# Patient Record
Sex: Male | Born: 1955
Health system: Southern US, Community
[De-identification: ages and names within clinical notes are randomized; demographics above are authoritative.]

## PROBLEM LIST (undated history)

## (undated) DIAGNOSIS — G4730 Sleep apnea, unspecified: Secondary | ICD-10-CM

## (undated) DIAGNOSIS — I10 Essential (primary) hypertension: Secondary | ICD-10-CM

## (undated) DIAGNOSIS — E785 Hyperlipidemia, unspecified: Secondary | ICD-10-CM

## (undated) DIAGNOSIS — Z86010 Personal history of colonic polyps: Secondary | ICD-10-CM

## (undated) DIAGNOSIS — IMO0002 Reserved for concepts with insufficient information to code with codable children: Secondary | ICD-10-CM

## (undated) DIAGNOSIS — T7840XA Allergy, unspecified, initial encounter: Secondary | ICD-10-CM

## (undated) DIAGNOSIS — M1990 Unspecified osteoarthritis, unspecified site: Secondary | ICD-10-CM

## (undated) DIAGNOSIS — E119 Type 2 diabetes mellitus without complications: Secondary | ICD-10-CM

## (undated) DIAGNOSIS — F32A Depression, unspecified: Secondary | ICD-10-CM

## (undated) HISTORY — DX: Allergy, unspecified, initial encounter: T78.40XA

## (undated) HISTORY — PX: VASECTOMY: SHX75

## (undated) HISTORY — DX: Unspecified osteoarthritis, unspecified site: M19.90

## (undated) HISTORY — DX: Personal history of colonic polyps: Z86.010

## (undated) HISTORY — PX: POLYPECTOMY: SHX149

## (undated) HISTORY — DX: Depression, unspecified: F32.A

## (undated) HISTORY — DX: Essential (primary) hypertension: I10

## (undated) HISTORY — DX: Sleep apnea, unspecified: G47.30

## (undated) HISTORY — DX: Type 2 diabetes mellitus without complications: E11.9

## (undated) HISTORY — PX: COLONOSCOPY: SHX174

## (undated) HISTORY — DX: Hyperlipidemia, unspecified: E78.5

## (undated) HISTORY — DX: Reserved for concepts with insufficient information to code with codable children: IMO0002

## (undated) SURGERY — COLONOSCOPY
Anesthesia: Moderate Sedation

---

## 2004-01-20 ENCOUNTER — Encounter: Admission: RE | Admit: 2004-01-20 | Discharge: 2004-01-20 | Payer: Self-pay | Admitting: Unknown Physician Specialty

## 2005-03-20 IMAGING — CR DG SINUSES COMPLETE 3+V
3 series · 3 of 3 positions shown · non-contrast
Comparison: none

CLINICAL DATA: Cough, wheezing.
 SINUS
 Three views of the paranasal sinuses were obtained.   No evidence of sinusitis is seen.  No bony abnormality is noted.
 IMPRESSION
 No sinusitis. 
 CHEST X-RAY
 The heart size and mediastinal contours are unremarkable.  The lungs are clear.  The visualized skeleton is unremarkable.
 No active lung disease.

[view not recorded (1 of 3)]
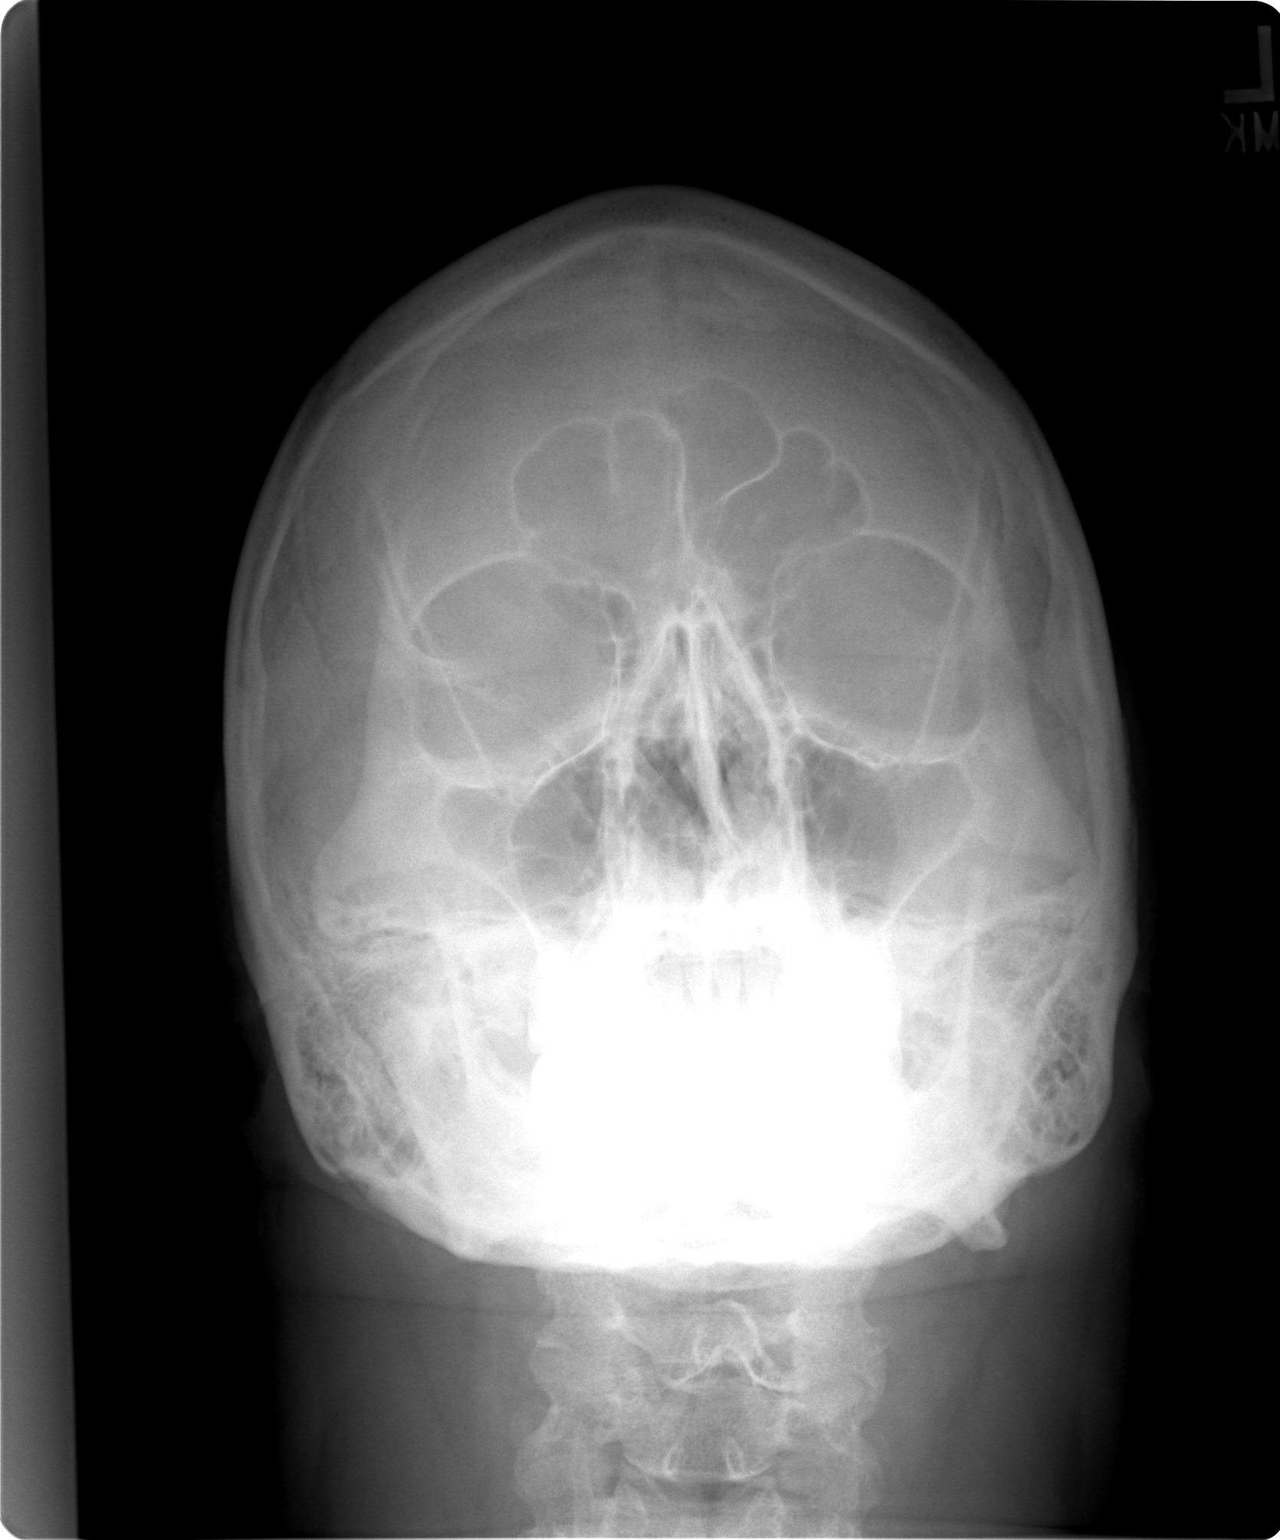

[view not recorded (2 of 3)]
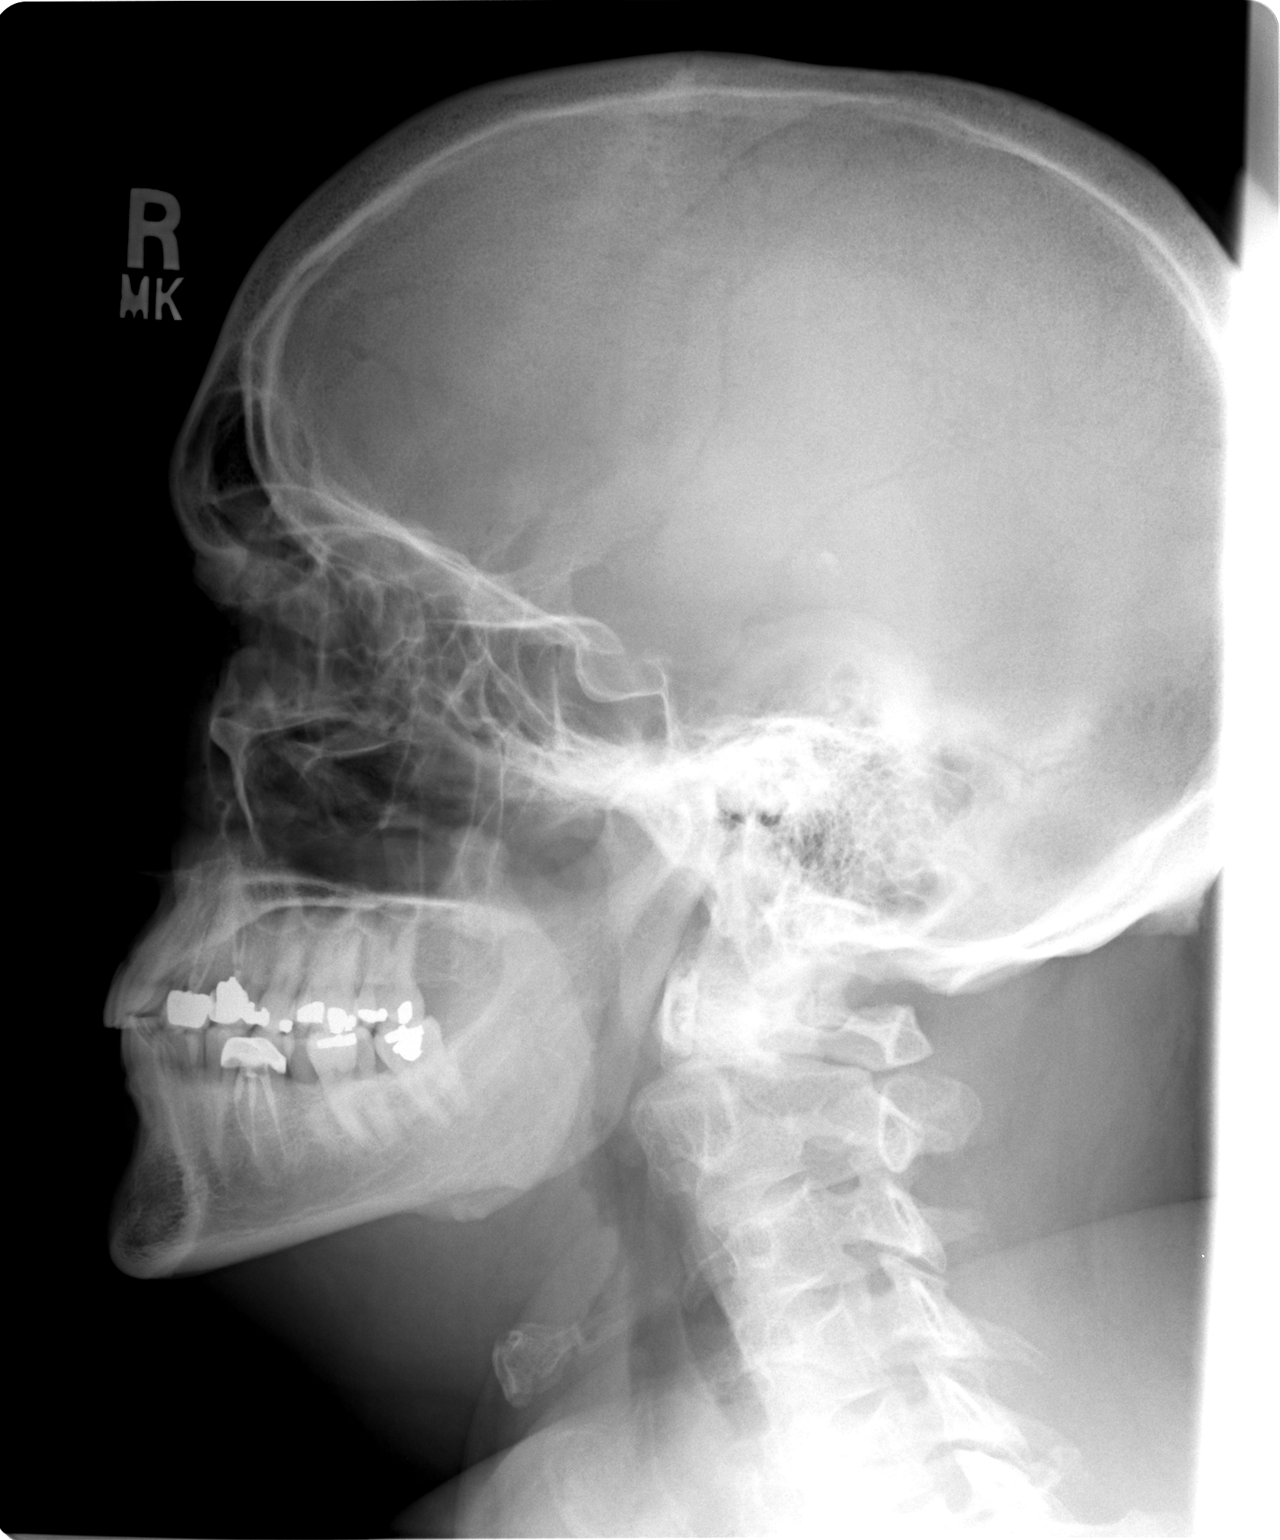

[view not recorded (3 of 3)]
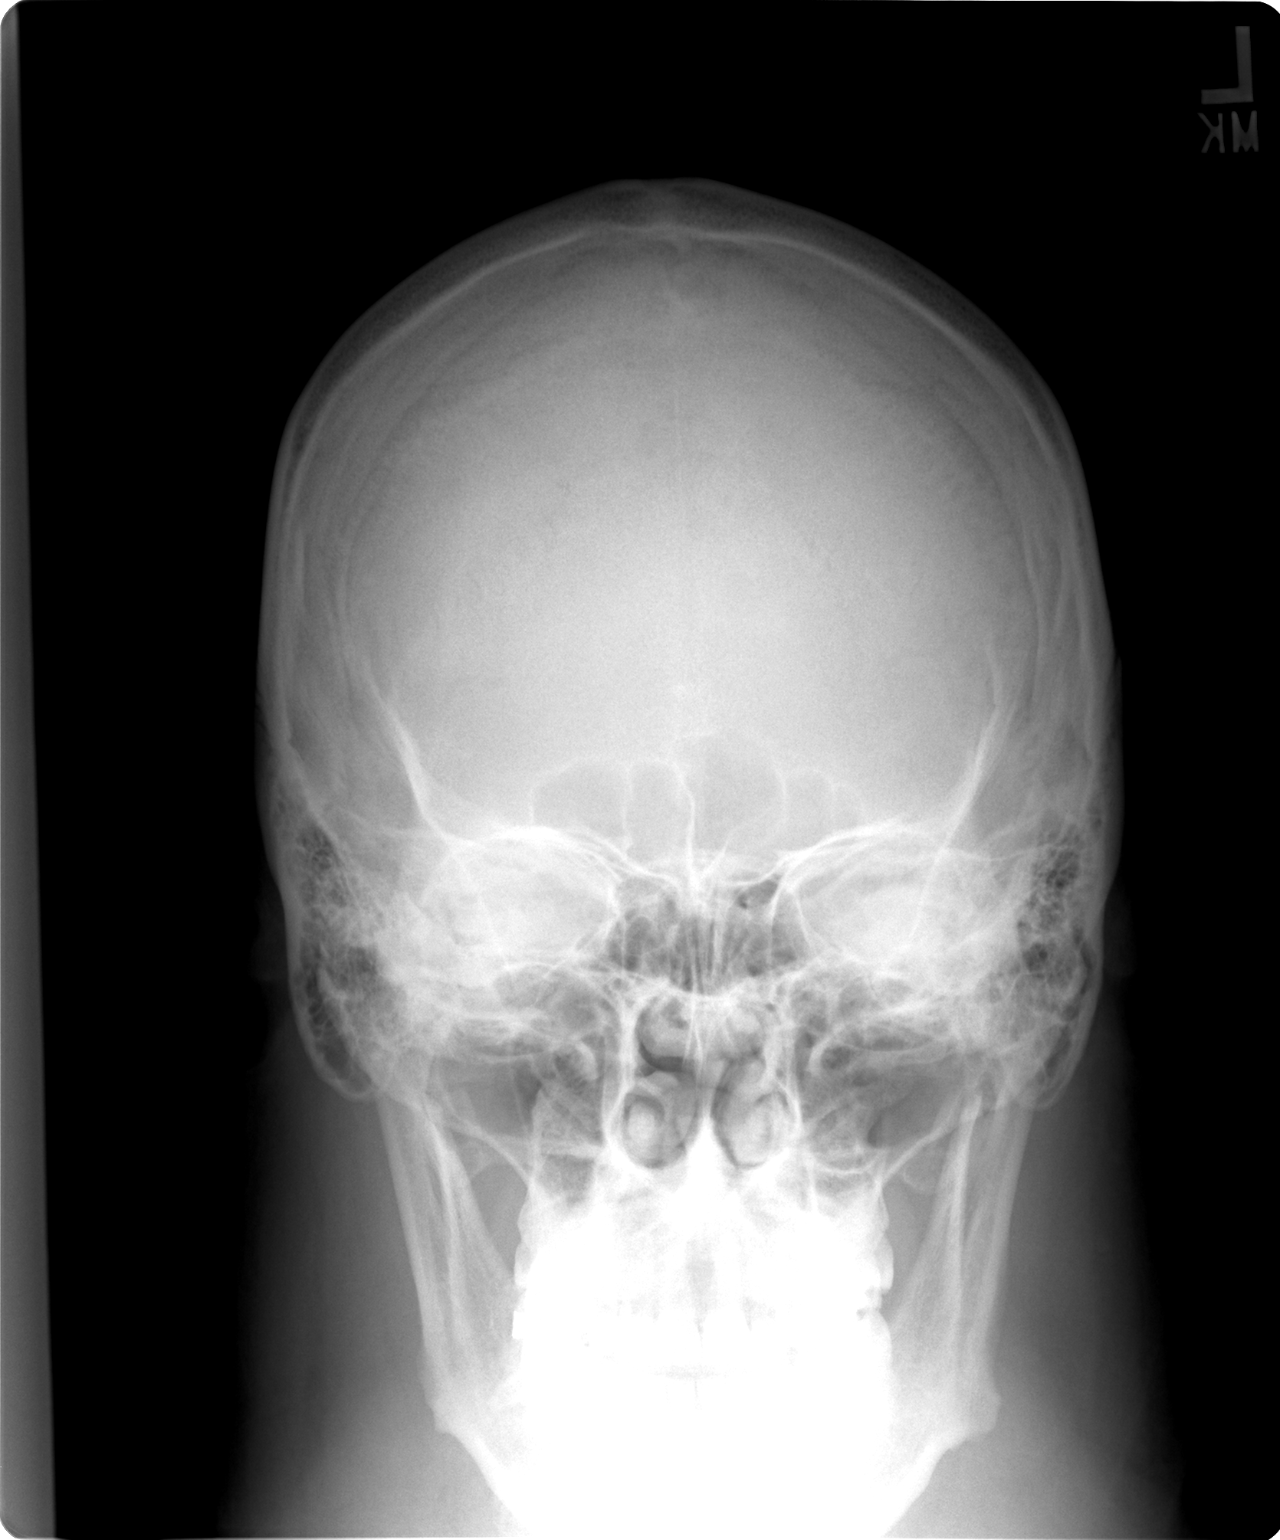

[3 of 3 positions shown; findings below may reference images not displayed]

## 2010-09-27 ENCOUNTER — Encounter (INDEPENDENT_AMBULATORY_CARE_PROVIDER_SITE_OTHER): Payer: Self-pay | Admitting: *Deleted

## 2010-10-07 NOTE — Letter (Signed)
Summary: Pre Visit Letter Revised  [facility]  [address] [place], [place] [address]   Phone: [phone]  Fax: [phone]        09/27/2010 MRN: [id number] [facility] [address] [place], [place]  [address]  [place]             Procedure Date:  11-04-10           Direct Colon---Dr.  Leone Payor   Welcome to the Gastroenterology Division at [facility].    You are scheduled to see a nurse for your pre-procedure visit on 10-20-10 at 3:30p.m. on the 3rd floor at [facility], [address]  We ask that you try to arrive at our office 15 minutes prior to your appointment time to allow for check-in.  Please take a minute to review the attached form.  If you answer "Yes" to one or more of the questions on the first page, we ask that you call the person listed at your earliest opportunity.  If you answer "No" to all of the questions, please complete the rest of the form and bring it to your appointment.    Your nurse visit will consist of discussing your medical and surgical history, your immediate family medical history, and your medications.   If you are unable to list all of your medications on the form, please bring the medication bottles to your appointment and we will list them.  We will need to be aware of both prescribed and over the counter drugs.  We will need to know exact dosage information as well.    Please be prepared to read and sign documents such as consent forms, a financial agreement, and acknowledgement forms.  If necessary, and with your consent, a friend or relative is welcome to sit-in on the nurse visit with you.  Please bring your insurance card so that we may make a copy of it.  If your insurance requires a referral to see a specialist, please bring your referral form from your primary care physician.  No co-pay is required for this nurse visit.     If you cannot keep your appointment, please call [phone] to cancel or reschedule prior  to your appointment date.  This allows [place] the opportunity to schedule an appointment for another patient in need of care.    Thank you for choosing [facility] for your medical needs.  We appreciate the opportunity to care for you.  Please visit [place] at our website  to learn more about our practice.  Sincerely, The Gastroenterology Division

## 2010-10-19 ENCOUNTER — Encounter (INDEPENDENT_AMBULATORY_CARE_PROVIDER_SITE_OTHER): Payer: Self-pay | Admitting: *Deleted

## 2010-10-28 NOTE — Letter (Signed)
Summary: Pre Visit Letter Revised  [facility]  [address] [place], [place] [address]   Phone: [phone]  Fax: [phone]        10/19/2010 MRN: [id number] [facility] [address] [place], [place]  [address]  [place]             Procedure Date:  12-03-10           Direct Colon---Dr. Leone Payor   Welcome to the Gastroenterology Division at [facility].    You are scheduled to see a nurse for your pre-procedure visit on 11-22-10 at 3:30p.m. on the 3rd floor at [facility], [address].  We ask that you try to arrive at our office 15 minutes prior to your appointment time to allow for check-in.  Please take a minute to review the attached form.  If you answer "Yes" to one or more of the questions on the first page, we ask that you call the person listed at your earliest opportunity.  If you answer "No" to all of the questions, please complete the rest of the form and bring it to your appointment.    Your nurse visit will consist of discussing your medical and surgical history, your immediate family medical history, and your medications.   If you are unable to list all of your medications on the form, please bring the medication bottles to your appointment and we will list them.  We will need to be aware of both prescribed and over the counter drugs.  We will need to know exact dosage information as well.    Please be prepared to read and sign documents such as consent forms, a financial agreement, and acknowledgement forms.  If necessary, and with your consent, a friend or relative is welcome to sit-in on the nurse visit with you.  Please bring your insurance card so that we may make a copy of it.  If your insurance requires a referral to see a specialist, please bring your referral form from your primary care physician.  No co-pay is required for this nurse visit.     If you cannot keep your appointment, please call [phone] to cancel or reschedule prior  to your appointment date.  This allows [place] the opportunity to schedule an appointment for another patient in need of care.    Thank you for choosing [facility] for your medical needs.  We appreciate the opportunity to care for you.  Please visit [place] at our website  to learn more about our practice.  Sincerely, The Gastroenterology Division

## 2010-11-04 ENCOUNTER — Other Ambulatory Visit: Payer: Self-pay | Admitting: Internal Medicine

## 2010-11-22 ENCOUNTER — Ambulatory Visit (AMBULATORY_SURGERY_CENTER): Payer: BC Managed Care – PPO | Admitting: *Deleted

## 2010-11-22 VITALS — Ht 72.0 in | Wt 301.5 lb

## 2010-11-22 DIAGNOSIS — Z1211 Encounter for screening for malignant neoplasm of colon: Secondary | ICD-10-CM

## 2010-11-22 MED ORDER — PEG-KCL-NACL-NASULF-NA ASC-C 100 G PO SOLR: ORAL | Status: DC

## 2010-11-23 ENCOUNTER — Encounter: Payer: Self-pay | Admitting: Internal Medicine

## 2010-12-03 ENCOUNTER — Other Ambulatory Visit: Payer: Self-pay | Admitting: Internal Medicine

## 2011-01-13 ENCOUNTER — Encounter: Payer: Self-pay | Admitting: Internal Medicine

## 2011-01-13 ENCOUNTER — Ambulatory Visit (AMBULATORY_SURGERY_CENTER): Payer: BC Managed Care – PPO | Admitting: Internal Medicine

## 2011-01-13 VITALS — BP 109/58 | HR 60 | Temp 97.5°F | Resp 16 | Ht 72.0 in | Wt 301.0 lb

## 2011-01-13 DIAGNOSIS — D128 Benign neoplasm of rectum: Secondary | ICD-10-CM

## 2011-01-13 DIAGNOSIS — D129 Benign neoplasm of anus and anal canal: Secondary | ICD-10-CM

## 2011-01-13 DIAGNOSIS — D1330 Benign neoplasm of unspecified part of small intestine: Secondary | ICD-10-CM

## 2011-01-13 DIAGNOSIS — Z800 Family history of malignant neoplasm of digestive organs: Secondary | ICD-10-CM

## 2011-01-13 DIAGNOSIS — Z1211 Encounter for screening for malignant neoplasm of colon: Secondary | ICD-10-CM

## 2011-01-13 DIAGNOSIS — K620 Anal polyp: Secondary | ICD-10-CM

## 2011-01-13 DIAGNOSIS — K621 Rectal polyp: Secondary | ICD-10-CM

## 2011-01-13 DIAGNOSIS — D126 Benign neoplasm of colon, unspecified: Secondary | ICD-10-CM

## 2011-01-13 HISTORY — PX: COLONOSCOPY W/ POLYPECTOMY: SHX1380

## 2011-01-13 MED ORDER — SODIUM CHLORIDE 0.9 % IV SOLN: 500.0000 mL | INTRAVENOUS | Status: DC

## 2011-01-13 MED ORDER — ASPIRIN 81 MG PO TABS: 81.0000 mg | ORAL_TABLET | Freq: Every day | ORAL | Status: DC

## 2011-01-13 NOTE — Patient Instructions (Signed)
Please follow discharge instructions on the green and blue handouts given to your care partner.  See colonoscopy findings on the pentax picture page also.  One of the three polyps removed today in the cecum was not completely removed.  We will be back in touch with the pathology results within 1-2 weeks.  Await pathology results to advise further colonoscopy recommendations.  Please hold aspirin, aspirin products and anti-inflammatory medications for 2 weeks until 01/27/2011.  You may resume your other medications today.

## 2011-01-13 NOTE — Progress Notes (Signed)
No complaints on discharge.  MAW 

## 2011-01-14 ENCOUNTER — Telehealth: Payer: Self-pay | Admitting: *Deleted

## 2011-01-14 NOTE — Telephone Encounter (Signed)
No message left due to no ID on VM.

## 2011-01-19 ENCOUNTER — Telehealth: Payer: Self-pay | Admitting: *Deleted

## 2011-01-19 ENCOUNTER — Encounter: Payer: Self-pay | Admitting: Internal Medicine

## 2011-01-19 DIAGNOSIS — Z860100 Personal history of colon polyps, unspecified: Secondary | ICD-10-CM

## 2011-01-19 HISTORY — DX: Personal history of colon polyps, unspecified: Z86.0100

## 2011-01-19 HISTORY — DX: Personal history of colonic polyps: Z86.010

## 2011-01-19 NOTE — Telephone Encounter (Signed)
Notes Recorded by Iva Boop, MD on 01/19/2011 at 7:33 AM Please call patient when you receive this and let him know polyps benign and that weill repeat colonoscopy by dec 2012 (letter to go out) ------  Notes Recorded by Iva Boop, MD on 01/19/2011 at 7:30 AM Multiple adenomas, one large and needs closer follow-up re: ? Removed Repeat colonoscopy 3-6 months (by Dec 202) at hospital to have PC available  Tried to reach pt at all three phone numbers- no i.d. On home or cell phone, not able to reach at his work number

## 2011-01-19 NOTE — Telephone Encounter (Signed)
Message copied by Inocente Salles on Wed Jan 19, 2011  3:47 PM ------      Message from: Stan Head E      Created: Wed Jan 19, 2011  7:33 AM       Please call patient when you receive this and let him know polyps benign and that weill repeat colonoscopy by dec 2012 (letter to go out)

## 2011-01-19 NOTE — Progress Notes (Signed)
Quick Note:  Multiple adenomas, one large and needs closer follow-up re: ? Removed Repeat colonoscopy 3-6 months (by Dec 202) at hospital to have PC available ______

## 2011-01-19 NOTE — Progress Notes (Signed)
Quick Note:  Please call patient when you receive this and let him know polyps benign and that weill repeat colonoscopy by dec 2012 (letter to go out) ______

## 2011-01-21 ENCOUNTER — Encounter: Payer: Self-pay | Admitting: *Deleted

## 2011-01-21 ENCOUNTER — Telehealth: Payer: Self-pay | Admitting: *Deleted

## 2011-01-21 NOTE — Telephone Encounter (Signed)
Patient notified of pathology result per Dr Marvell Fuller note and Dr Marvell Fuller desire to repeat his colonoscopy in 3-6 months at the hospital per his note. Also told patient he should be receiving a letter from [place] as well and that if he has any problems or concerns to please call Dr Teresita Madura office. I notified patient we put in for the repeat procedure for September 2012 but we have until December per the note but he will be contacted by September to have procedure done. Patient returned verbal understanding of all information given.  EWM,RN

## 2011-06-01 ENCOUNTER — Telehealth: Payer: Self-pay

## 2011-06-01 NOTE — Telephone Encounter (Signed)
Left message for patient to call back to discuss colon

## 2011-06-07 NOTE — Telephone Encounter (Signed)
Scheduled for 07/08/11 12:30 [facility] scheduled with Noreene Larsson booking # [phone].  Patient will come for a pre-visit 07/01/11 4:00

## 2011-06-29 ENCOUNTER — Telehealth: Payer: Self-pay | Admitting: Internal Medicine

## 2011-06-29 NOTE — Telephone Encounter (Signed)
Patient is not able to make the appt at [facility] for 07/08/11.  He wants to reschedule repeat colon for January I will call him once the schedule comes out.

## 2011-07-08 ENCOUNTER — Ambulatory Visit (HOSPITAL_COMMUNITY): Admit: 2011-07-08 | Payer: Self-pay | Admitting: Internal Medicine

## 2011-07-08 ENCOUNTER — Encounter (HOSPITAL_COMMUNITY): Payer: Self-pay

## 2011-07-08 SURGERY — COLONOSCOPY
Anesthesia: Moderate Sedation

## 2011-10-07 ENCOUNTER — Telehealth: Payer: Self-pay | Admitting: Internal Medicine

## 2011-10-07 NOTE — Telephone Encounter (Signed)
Patient needs to reschedule a colon at the hospital for Dr Marvell Fuller next hospital week.  I have explained that that schedule is not available and have asked him to call back the end of march

## 2011-11-15 ENCOUNTER — Telehealth: Payer: Self-pay | Admitting: Internal Medicine

## 2011-11-15 NOTE — Telephone Encounter (Signed)
Patient is rescheduled from December for 12/14/11 8:30, he will come for a pre-visit on 12/06/11 3:30.  The patient is aware of the dates and times

## 2011-11-16 ENCOUNTER — Other Ambulatory Visit: Payer: Self-pay

## 2011-12-06 ENCOUNTER — Telehealth: Payer: Self-pay | Admitting: *Deleted

## 2011-12-06 NOTE — Telephone Encounter (Signed)
Pt no show for previsit on 12/06/11.  I spoke with his wife Marylu Lund and [facility] his previsit for Monday 12/12/11 and instructed her to have [place] eliminate Nuts, seed, popcorn, corn, beans, peas, salad and raw vegetables from his diet starting Friday 12/09/11.  I was unable to schedule him this week due to full schedule.  Wyona Almas

## 2011-12-12 ENCOUNTER — Ambulatory Visit (AMBULATORY_SURGERY_CENTER): Payer: BC Managed Care – PPO | Admitting: *Deleted

## 2011-12-12 VITALS — Ht 72.0 in | Wt 290.0 lb

## 2011-12-12 DIAGNOSIS — D126 Benign neoplasm of colon, unspecified: Secondary | ICD-10-CM

## 2011-12-12 MED ORDER — PEG-KCL-NACL-NASULF-NA ASC-C 100 G PO SOLR: ORAL | Status: DC

## 2011-12-14 ENCOUNTER — Encounter (HOSPITAL_COMMUNITY)
Admission: RE | Disposition: A | Discharge status: Home / Self Care | Payer: Self-pay | Source: Ambulatory Visit | Attending: Internal Medicine

## 2011-12-14 ENCOUNTER — Ambulatory Visit (HOSPITAL_COMMUNITY)
Admission: RE | Admit: 2011-12-14 | Discharge: 2011-12-14 | Disposition: A | Discharge status: Home / Self Care | Payer: BC Managed Care – PPO | Source: Ambulatory Visit | Attending: Internal Medicine | Admitting: Internal Medicine

## 2011-12-14 ENCOUNTER — Encounter (HOSPITAL_COMMUNITY): Payer: Self-pay

## 2011-12-14 DIAGNOSIS — I10 Essential (primary) hypertension: Secondary | ICD-10-CM | POA: Insufficient documentation

## 2011-12-14 DIAGNOSIS — G4730 Sleep apnea, unspecified: Secondary | ICD-10-CM | POA: Insufficient documentation

## 2011-12-14 DIAGNOSIS — E785 Hyperlipidemia, unspecified: Secondary | ICD-10-CM | POA: Insufficient documentation

## 2011-12-14 DIAGNOSIS — D126 Benign neoplasm of colon, unspecified: Secondary | ICD-10-CM | POA: Insufficient documentation

## 2011-12-14 HISTORY — PX: COLONOSCOPY: SHX5424

## 2011-12-14 HISTORY — PX: HOT HEMOSTASIS: SHX5433

## 2011-12-14 SURGERY — COLONOSCOPY
Anesthesia: Moderate Sedation

## 2011-12-14 MED ORDER — FENTANYL CITRATE 0.05 MG/ML IJ SOLN
INTRAMUSCULAR | Status: AC
  Filled 2011-12-14: qty 4

## 2011-12-14 MED ORDER — MIDAZOLAM HCL 10 MG/2ML IJ SOLN
INTRAMUSCULAR | Status: AC
  Filled 2011-12-14: qty 4

## 2011-12-14 MED ORDER — MIDAZOLAM HCL 5 MG/5ML IJ SOLN
INTRAMUSCULAR | Status: DC | PRN
  Administered 2011-12-14 (×3): 2.5 mg via INTRAVENOUS

## 2011-12-14 MED ORDER — FENTANYL NICU IV SYRINGE 50 MCG/ML
INJECTION | INTRAMUSCULAR | Status: DC | PRN
  Administered 2011-12-14 (×3): 25 ug via INTRAVENOUS

## 2011-12-14 MED ORDER — ASPIRIN 81 MG PO TABS: 81.0000 mg | ORAL_TABLET | Freq: Every day | ORAL | Status: AC

## 2011-12-14 MED ORDER — DIPHENHYDRAMINE HCL 50 MG/ML IJ SOLN
INTRAMUSCULAR | Status: AC
  Filled 2011-12-14: qty 1

## 2011-12-14 NOTE — Op Note (Signed)
[facility] [address] [place], [place]  [address]  COLONOSCOPY PROCEDURE REPORT  PATIENT:  Mark White, Mark White  MR#:  [id number] BIRTHDATE:  07-11-1956, 55 yrs. old  GENDER:  male ENDOSCOPIST:  Iva Boop, MD, [facility]  PROCEDURE DATE:  12/14/2011 PROCEDURE:  Colonoscopy with snare polypectomy, Colonoscopy with tumor ablation ASA CLASS:  Class II INDICATIONS:  close follow-up of large cecal polyp - snared 12/2010. Also had other adenomas. MEDICATIONS:   Fentanyl 75 mg IV, Versed 7.5 mg IV  DESCRIPTION OF PROCEDURE:   After the risks benefits and alternatives of the procedure were thoroughly explained, informed consent was obtained.  Digital rectal exam was performed and revealed no abnormalities.   The Pentax Colonoscope [id number] endoscope was introduced through the anus and advanced to the cecum, which was identified by both the appendix and ileocecal valve, without limitations.  The quality of the prep was good, using MoviPrep.  The instrument was then slowly withdrawn as the colon was fully examined. <<PROCEDUREIMAGES>>  FINDINGS:  A sessile polyp was found in the cecum. Residual polyp 8-10 mm, from 3 cm polyp snared 12/2011. Snared piecemeal and APC cautery ablation performed.  Six other polyps were found. 1) ? IC valve polyp 8-10 mm snare cautery removal 2) 4 mm cecal polyp cold snared 3) 12 mm flat cecal poly (?) snare cautery removal 4) 1 cm ascending colon polyp snare cautery removal 5) two diminutive sigmoid polyps cold snared  This was otherwise a normal examination of the colon. Difficult to obtain and maintain position in cecum - umbilical pressure allowed this.   Retroflexed views in the rectum revealed no abnormalities.    The time to cecum = 4:00 minutes. The scope was then withdrawn and polyps snared in 49:00 minutes from the cecum and the procedure completed. COMPLICATIONS:  None ENDOSCOPIC IMPRESSION: 1) Sessile polyp in the cecum - residual from  12/2010 polypectomy  2) Six other  polyps/possible polyps removed 3) Otherwise normal examination RECOMMENDATIONS: 1) Hold aspirin, aspirin products, and anti-inflammatory medication for 2 weeks. REPEAT EXAM:  In for Colonoscopy, pending biopsy results.  Iva Boop, MD, [facility]  CC:  [facility]  n. eSIGNED:   Iva Boop at 12/14/2011 09:46 AM  Dwana Melena, [phone]

## 2011-12-14 NOTE — H&P (Signed)
  Cc:  Hx of colon polyps  HPI  Patient is here for follow-up of adenomatous colon polyps. No GI complaints today.  No Known Allergies @[facility] @ Past Medical History  Diagnosis Date  . Sleep apnea     not using CPAP  . Hypertension   . Hyperlipidemia   . Personal history of adenomatous colonic polyps 01/19/2011  . Ulcer     30 years ago   Past Surgical History  Procedure Date  . Colonoscopy w/ polypectomy 01/13/11    3 polyps, largest 3 cm cecal polyp - adenomas   History   Social History  . Marital Status: Married    Spouse Name: N/A    Number of Children: N/A  . Years of Education: N/A   Social History Main Topics  . Smoking status: Never Smoker   . Smokeless tobacco: Never Used  . Alcohol Use: None     rarely 1-2 a month/beer  . Drug Use: No  . Sexually Active: None   Other Topics Concern  . None   Social History Narrative  . None   Family History  Problem Relation Age of Onset  . Colon cancer Mother    PE:  See pre-sedation exam  A/P  History of colon polyps, one 3 cm - needs closer follow-up, for colonoscopy today  The risks and benefits as well as alternatives of endoscopic procedure(s) have been discussed and reviewed. All questions answered. The patient agrees to proceed.

## 2011-12-14 NOTE — Discharge Instructions (Signed)
Colonoscopy Care After Read the instructions outlined below and refer to this sheet in the next few weeks. These discharge instructions provide you with general information on caring for yourself after you leave the hospital. Your doctor may also give you specific instructions. While your treatment has been planned according to the most current medical practices available, unavoidable complications occasionally occur. If you have any problems or questions after discharge, call your doctor. HOME CARE INSTRUCTIONS ACTIVITY:  You may resume your regular activity, but move at a slower pace for the next 24 hours.   Take frequent rest periods for the next 24 hours.   Walking will help get rid of the air and reduce the bloated feeling in your belly (abdomen).   No driving for 24 hours (because of the medicine (anesthesia) used during the test).   You may shower.   Do not sign any important legal documents or operate any machinery for 24 hours (because of the anesthesia used during the test).  NUTRITION:  Drink plenty of fluids.   You may resume your normal diet as instructed by your doctor.   Begin with a light meal and progress to your normal diet. Heavy or fried foods are harder to digest and may make you feel sick to your stomach (nauseated).   Avoid alcoholic beverages for 24 hours or as instructed.  MEDICATIONS:  You may resume your normal medications unless your doctor tells you otherwise.  WHAT TO EXPECT TODAY:  Some feelings of bloating in the abdomen.   Passage of more gas than usual.   Spotting of blood in your stool or on the toilet paper.  IF YOU HAD POLYPS REMOVED DURING THE COLONOSCOPY:  No aspirin products for 7 days or as instructed.   No alcohol for 7 days or as instructed.   Eat a soft diet for the next 24 hours.  FINDING OUT THE RESULTS OF YOUR TEST Not all test results are available during your visit. If your test results are not back during the visit, make an  appointment with your caregiver to find out the results. Do not assume everything is normal if you have not heard from your caregiver or the medical facility. It is important for you to follow up on all of your test results.  SEEK IMMEDIATE MEDICAL CARE IF:  You have more than a spotting of blood in your stool.   Your belly is swollen (abdominal distention).   You are nauseated or vomiting.   You have a fever.   You have abdominal pain or discomfort that is severe or gets worse throughout the day.    Moderate Sedation, Adult Moderate sedation is given to help you relax or even sleep through a procedure. You may remain sleepy, be clumsy, or have poor balance for several hours following this procedure. Arrange for a responsible adult, family member, or friend to take you home. A responsible adult should stay with you for at least 24 hours or until the medicines have worn off.  Do not participate in any activities where you could become injured for the next 24 hours, or until you feel normal again. Do not:   Drive.   Swim.   Ride a bicycle.   Operate heavy machinery.   Cook.   Use power tools.   Climb ladders.   Work at [employer].   Do not make important decisions or sign legal documents until you are improved.   Vomiting may occur if you eat too soon. When  you can drink without vomiting, try water, juice, or soup. Try solid foods if you feel little or no nausea.   Only take over-the-counter or prescription medications for pain, discomfort, or fever as directed by your caregiver.If pain medications have been prescribed for you, ask your caregiver how soon it is safe to take them.   Make sure you and your family fully understands everything about the medication given to you. Make sure you understand what side effects may occur.   You should not drink alcohol, take sleeping pills, or medications that cause drowsiness for at least 24 hours.   If you smoke, do not smoke alone.    If you are feeling better, you may resume normal activities 24 hours after receiving sedation.   Keep all appointments as scheduled. Follow all instructions.   Ask questions if you do not understand.  SEEK MEDICAL CARE IF:   Your skin is pale or bluish in color.   You continue to feel sick to your stomach (nauseous) or throw up (vomit).   Your pain is getting worse and not helped by medication.   You have bleeding or swelling.   You are still sleepy or feeling clumsy after 24 hours.  SEEK IMMEDIATE MEDICAL CARE IF:   You develop a rash.   You have difficulty breathing.   You develop any type of allergic problem.   You have a fever.  Document Released: 04/12/2001 Document Revised: 07/07/2011 Document Reviewed: 09/03/2007 [facility] Patient Information 2012 [place], [place].

## 2011-12-16 ENCOUNTER — Encounter (HOSPITAL_COMMUNITY): Payer: Self-pay | Admitting: Internal Medicine

## 2011-12-18 ENCOUNTER — Encounter: Payer: Self-pay | Admitting: Internal Medicine

## 2011-12-18 NOTE — Progress Notes (Signed)
Quick Note:  residual cecal adenoma 2 other adenomas Colonoscopy recall 4 months (to do by 6 months) - with APC available  Recall for September 2013 ______

## 2012-06-08 ENCOUNTER — Encounter: Payer: Self-pay | Admitting: Internal Medicine

## 2012-09-06 ENCOUNTER — Telehealth: Payer: Self-pay | Admitting: Internal Medicine

## 2012-09-07 NOTE — Telephone Encounter (Signed)
Patient is scheduled for 10/10/12 0830 colon with APC.  Scheduled with Noreene Larsson booking number [phone].  Patient aware of appt date and time, and to come for a pre-visit 09/28/12 2:30

## 2012-09-28 ENCOUNTER — Ambulatory Visit (AMBULATORY_SURGERY_CENTER): Payer: BC Managed Care – PPO | Admitting: *Deleted

## 2012-09-28 ENCOUNTER — Encounter: Payer: Self-pay | Admitting: Internal Medicine

## 2012-09-28 VITALS — Ht 72.0 in | Wt 309.4 lb

## 2012-09-28 DIAGNOSIS — Z1211 Encounter for screening for malignant neoplasm of colon: Secondary | ICD-10-CM

## 2012-09-28 MED ORDER — NA SULFATE-K SULFATE-MG SULF 17.5-3.13-1.6 GM/177ML PO SOLN: ORAL | Status: DC

## 2012-10-10 ENCOUNTER — Ambulatory Visit (HOSPITAL_COMMUNITY)
Admission: RE | Admit: 2012-10-10 | Discharge: 2012-10-10 | Disposition: A | Discharge status: Home / Self Care | Payer: BC Managed Care – PPO | Source: Ambulatory Visit | Attending: Internal Medicine | Admitting: Internal Medicine

## 2012-10-10 ENCOUNTER — Encounter (HOSPITAL_COMMUNITY)
Admission: RE | Disposition: A | Discharge status: Home / Self Care | Payer: Self-pay | Source: Ambulatory Visit | Attending: Internal Medicine

## 2012-10-10 ENCOUNTER — Encounter: Payer: Self-pay | Admitting: Internal Medicine

## 2012-10-10 ENCOUNTER — Encounter (HOSPITAL_COMMUNITY): Payer: Self-pay | Admitting: *Deleted

## 2012-10-10 DIAGNOSIS — Z79899 Other long term (current) drug therapy: Secondary | ICD-10-CM | POA: Insufficient documentation

## 2012-10-10 DIAGNOSIS — D126 Benign neoplasm of colon, unspecified: Secondary | ICD-10-CM | POA: Insufficient documentation

## 2012-10-10 DIAGNOSIS — I10 Essential (primary) hypertension: Secondary | ICD-10-CM | POA: Insufficient documentation

## 2012-10-10 DIAGNOSIS — Z800 Family history of malignant neoplasm of digestive organs: Secondary | ICD-10-CM | POA: Insufficient documentation

## 2012-10-10 DIAGNOSIS — G4730 Sleep apnea, unspecified: Secondary | ICD-10-CM | POA: Insufficient documentation

## 2012-10-10 DIAGNOSIS — E785 Hyperlipidemia, unspecified: Secondary | ICD-10-CM | POA: Insufficient documentation

## 2012-10-10 DIAGNOSIS — Z7982 Long term (current) use of aspirin: Secondary | ICD-10-CM | POA: Insufficient documentation

## 2012-10-10 DIAGNOSIS — Z86010 Personal history of colonic polyps: Secondary | ICD-10-CM

## 2012-10-10 HISTORY — PX: COLONOSCOPY: SHX5424

## 2012-10-10 SURGERY — COLONOSCOPY
Anesthesia: Moderate Sedation

## 2012-10-10 MED ORDER — MIDAZOLAM HCL 10 MG/2ML IJ SOLN
INTRAMUSCULAR | Status: AC
  Filled 2012-10-10: qty 2

## 2012-10-10 MED ORDER — MIDAZOLAM HCL 5 MG/5ML IJ SOLN
INTRAMUSCULAR | Status: DC | PRN
  Administered 2012-10-10 (×3): 2 mg via INTRAVENOUS

## 2012-10-10 MED ORDER — FENTANYL CITRATE 0.05 MG/ML IJ SOLN
INTRAMUSCULAR | Status: AC
  Filled 2012-10-10: qty 2

## 2012-10-10 MED ORDER — FENTANYL CITRATE 0.05 MG/ML IJ SOLN
INTRAMUSCULAR | Status: DC | PRN
  Administered 2012-10-10 (×3): 25 ug via INTRAVENOUS

## 2012-10-10 MED ORDER — DIPHENHYDRAMINE HCL 50 MG/ML IJ SOLN
INTRAMUSCULAR | Status: AC
  Filled 2012-10-10: qty 1

## 2012-10-10 MED ORDER — SODIUM CHLORIDE 0.9 % IV SOLN
INTRAVENOUS | Status: DC
  Administered 2012-10-10: 08:00:00 via INTRAVENOUS

## 2012-10-10 MED ORDER — SODIUM CHLORIDE 0.9 % IV SOLN: INTRAVENOUS | Status: DC

## 2012-10-10 NOTE — Discharge Instructions (Signed)
The large polyp in the cecum is gone. I found one new tiny polyp - removed but did not capture - it is not a problem.  I recommend you have a routine colonoscopy in 3 years - 2017.  Thank you for choosing me and [facility].  Iva Boop, MD, [facility]  Colonoscopy Care After Read the instructions outlined below and refer to this sheet in the next few weeks. These discharge instructions provide you with general information on caring for yourself after you leave the hospital. Your doctor may also give you specific instructions. While your treatment has been planned according to the most current medical practices available, unavoidable complications occasionally occur. If you have any problems or questions after discharge, call your doctor. HOME CARE INSTRUCTIONS ACTIVITY:  You may resume your regular activity, but move at a slower pace for the next 24 hours.  Take frequent rest periods for the next 24 hours.  Walking will help get rid of the air and reduce the bloated feeling in your belly (abdomen).  No driving for 24 hours (because of the medicine (anesthesia) used during the test).  You may shower.  Do not sign any important legal documents or operate any machinery for 24 hours (because of the anesthesia used during the test). NUTRITION:  Drink plenty of fluids.  You may resume your normal diet as instructed by your doctor.  Begin with a light meal and progress to your normal diet. Heavy or fried foods are harder to digest and may make you feel sick to your stomach (nauseated).  Avoid alcoholic beverages for 24 hours or as instructed. MEDICATIONS:  You may resume your normal medications unless your doctor tells you otherwise. WHAT TO EXPECT TODAY:  Some feelings of bloating in the abdomen.  Passage of more gas than usual.  Spotting of blood in your stool or on the toilet paper. IF YOU HAD POLYPS REMOVED DURING THE COLONOSCOPY:  No aspirin products for 7  days or as instructed.  No alcohol for 7 days or as instructed.  Eat a soft diet for the next 24 hours. FINDING OUT THE RESULTS OF YOUR TEST Not all test results are available during your visit. If your test results are not back during the visit, make an appointment with your caregiver to find out the results. Do not assume everything is normal if you have not heard from your caregiver or the medical facility. It is important for you to follow up on all of your test results.  SEEK IMMEDIATE MEDICAL CARE IF:  You have more than a spotting of blood in your stool.  Your belly is swollen (abdominal distention).  You are nauseated or vomiting.  You have a fever.  You have abdominal pain or discomfort that is severe or gets worse throughout the day. Document Released: 03/01/2004 Document Revised: 10/10/2011 Document Reviewed: 02/28/2008 [facility] Patient Information 2013 [place], [place].  Moderate Sedation, Adult Moderate sedation is given to help you relax or even sleep through a procedure. You may remain sleepy, be clumsy, or have poor balance for several hours following this procedure. Arrange for a responsible adult, family member, or friend to take you home. A responsible adult should stay with you for at least 24 hours or until the medicines have worn off.  Do not participate in any activities where you could become injured for the next 24 hours, or until you feel normal again. Do not:  Drive.  Swim.  Ride a bicycle.  Operate heavy machinery.  Cook.  Use power tools.  Climb ladders.  Work at [employer].  Do not make important decisions or sign legal documents until you are improved.  Vomiting may occur if you eat too soon. When you can drink without vomiting, try water, juice, or soup. Try solid foods if you feel little or no nausea.  Only take over-the-counter or prescription medications for pain, discomfort, or fever as directed by your caregiver.If pain medications have  been prescribed for you, ask your caregiver how soon it is safe to take them.  Make sure you and your family fully understands everything about the medication given to you. Make sure you understand what side effects may occur.  You should not drink alcohol, take sleeping pills, or medications that cause drowsiness for at least 24 hours.  If you smoke, do not smoke alone.  If you are feeling better, you may resume normal activities 24 hours after receiving sedation.  Keep all appointments as scheduled. Follow all instructions.  Ask questions if you do not understand. SEEK MEDICAL CARE IF:   Your skin is pale or bluish in color.  You continue to feel sick to your stomach (nauseous) or throw up (vomit).  Your pain is getting worse and not helped by medication.  You have bleeding or swelling.  You are still sleepy or feeling clumsy after 24 hours. SEEK IMMEDIATE MEDICAL CARE IF:   You develop a rash.  You have difficulty breathing.  You develop any type of allergic problem.  You have a fever. Document Released: 04/12/2001 Document Revised: 10/10/2011 Document Reviewed: 09/03/2007 [facility] Patient Information 2013 [place], [place].

## 2012-10-10 NOTE — Op Note (Signed)
[facility] [address] [place], [address]   COLONOSCOPY PROCEDURE REPORT  PATIENT: Mark White, Mark White  MR#: [id number] BIRTHDATE: 1955/11/27 , 56  yrs. old GENDER: Male ENDOSCOPIST: Iva Boop, MD, [facility] PROCEDURE DATE:  10/10/2012 PROCEDURE:   Colonoscopy with snare polypectomy ASA CLASS:   Class II INDICATIONS:follow up of adenomatous colonic polyp(s).   close follow-up large cecal adenoma removed in 2012 and 2013 (APC in 2013) MEDICATIONS: Fentanyl 75 mcg IV and Versed 6 mg IV  DESCRIPTION OF PROCEDURE:   After the risks benefits and alternatives of the procedure were thoroughly explained, informed consent was obtained.  A digital rectal exam revealed no abnormalities of the rectum, A digital rectal exam revealed the prostate was not enlarged, and A digital rectal exam revealed no prostatic nodules.   The Pentax Colonoscope [id number]  endoscope was introduced through the anus and advanced to the cecum, which was identified by both the appendix and ileocecal valve. No adverse events experienced.   The quality of the prep was Suprep excellent The instrument was then slowly withdrawn as the colon was fully examined.      COLON FINDINGS: A diminutive smooth sessile polyp was found in the transverse colon.  A polypectomy was performed with a cold snare. The resection was complete and the polyp tissue was not retrieved. The colon mucosa was otherwise normal.  Retroflexed views revealed no abnormalities. The time to cecum=2 minutes 0 seconds. Withdrawal time=13 minutes 0 seconds.  The scope was withdrawn and the procedure completed. COMPLICATIONS: There were no complications.  ENDOSCOPIC IMPRESSION: 1.   Diminutive sessile polyp was found in the transverse colon; polypectomy was performed with a cold snare 2.   The colon mucosa was otherwise normal  RECOMMENDATIONS: Repeat Colonoscopy in 3 years in this patuient with hx multiple adenomas including 3 cm  cecal adenoma requiring 2 colonoscopies to remove (it was not seen today - gone) and a family hx colon cancer   eSigned:  Iva Boop, MD, [facility] 10/10/2012 9:30 AM   cc: [facility] and The Patient

## 2012-10-10 NOTE — H&P (Signed)
[facility]    Primary Care Physician:  Charolett Bumpers, MD Primary Gastroenterologist:  Dr. Leone Payor    Assessment:  Colon polyp - evaluate for eradication     Recommendations: Colonoscopy with possible polypectomy and APC    The risks and benefits as well as alternatives of endoscopic procedure(s) have been discussed and reviewed. All questions answered. The patient agrees to proceed.      HPI: Mark White is a 57 y.o. male with hx colon polyps. He has had a cecal polyp that has required multiple polypectomies. For reassessment today. 9 months since last colonoscopy.   Past Medical History  Diagnosis Date  . Hypertension   . Hyperlipidemia   . Personal history of adenomatous colonic polyps 01/19/2011  . Ulcer     30 years ago  . Sleep apnea     not using CPAP    Past Surgical History  Procedure Laterality Date  . Colonoscopy w/ polypectomy  01/13/11    3 polyps, largest 3 cm cecal polyp - adenomas  . Colonoscopy  12/14/2011    Procedure: COLONOSCOPY;  Surgeon: Iva Boop, MD;  Location: [facility];  Service: Endoscopy;  Laterality: N/A;  colon wiht APC for follow up of colon polyps  . Hot hemostasis  12/14/2011    Procedure: HOT HEMOSTASIS (ARGON PLASMA COAGULATION/BICAP);  Surgeon: Iva Boop, MD;  Location: [facility];  Service: Endoscopy;  Laterality: N/A;    Prior to Admission medications   Medication Sig Start Date End Date Taking? Authorizing Provider  valsartan (DIOVAN) 160 MG tablet Take 160 mg by mouth daily.     Yes Historical Provider, MD  aspirin 81 MG tablet Take 1 tablet (81 mg total) by mouth daily. STOP TAKING AND RESTART Dec 29 2011 12/14/11   Iva Boop, MD  atorvastatin (LIPITOR) 40 MG tablet Take 40 mg by mouth daily.      Historical Provider, MD  fenofibrate (TRICOR) 145 MG tablet Take 145 mg by mouth daily.      Historical Provider, MD  Na Sulfate-K Sulfate-Mg Sulf (SUPREP BOWEL PREP) SOLN suprep as directed.  No  substitutions 09/28/12   Iva Boop, MD    Current Facility-Administered Medications  Medication Dose Route Frequency Provider Last Rate Last Dose  . 0.9 %  sodium chloride infusion   Intravenous Continuous Iva Boop, MD 20 mL/hr at 10/10/12 0759    . 0.9 %  sodium chloride infusion   Intravenous Continuous Iva Boop, MD        Allergies as of 09/07/2012  . (No Known Allergies)    Family History  Problem Relation Age of Onset  . Colon cancer Mother 70    History   Social History  . Marital Status: Married    Spouse Name: N/A    Number of Children: N/A  . Years of Education: N/A   Occupational History  . Not on file.   Social History Main Topics  . Smoking status: Never Smoker   . Smokeless tobacco: Never Used  . Alcohol Use: Yes     Comment: rarely 4x a month/beer  . Drug Use: No     Review of Systems: As per HPI  Physical Exam: Vital signs in last 24 hours: Temp:  [98 F (36.7 C)] 98 F (36.7 C) (03/12 0744) Resp:  [15] 15 (03/12 0744) BP: (145-187)/(81-95) 145/95 mmHg (03/12 0749) SpO2:  [96 %] 96 % (03/12 0744) Weight:  [300 lb (136.079 kg)] 300 lb (  136.079 kg) (03/12 0744)   General:   Alert,  Well-developed, well-nourished, pleasant and cooperative in NAD Neck:  Supple; no masses or thyromegaly. Lungs:  Clear throughout to auscultation.   Heart:  Regular rate and rhythm; no murmurs, clicks, rubs,  or gallops. Abdomen:  Soft, nontender and nondistended.  Psych:  Alert and cooperative. Normal mood and affect.      LOS: 0 days      @Carl  Sena Slate, MD, [facility] [phone] (pager) 10/10/2012 8:45 AM@

## 2012-10-11 ENCOUNTER — Encounter (HOSPITAL_COMMUNITY): Payer: Self-pay | Admitting: Internal Medicine

## 2012-12-20 ENCOUNTER — Encounter: Payer: BC Managed Care – PPO | Attending: Unknown Physician Specialty | Admitting: Dietician

## 2012-12-20 ENCOUNTER — Encounter: Payer: Self-pay | Admitting: Dietician

## 2012-12-20 VITALS — Ht 72.0 in | Wt 306.5 lb

## 2012-12-20 DIAGNOSIS — E119 Type 2 diabetes mellitus without complications: Secondary | ICD-10-CM

## 2012-12-20 DIAGNOSIS — Z713 Dietary counseling and surveillance: Secondary | ICD-10-CM | POA: Insufficient documentation

## 2012-12-20 NOTE — Progress Notes (Signed)
  Patient was seen on 12/20/2012 for the first of a series of three diabetes self-management courses at the [facility]. The following learning objectives were met by the patient during this course:  HT: 72 in  WT: 306.5 lb   A1C:7.2% 10/2012   Defines the role of glucose and insulin  Identifies type of diabetes and pathophysiology  Defines the diagnostic criteria for diabetes and prediabetes  States the risk factors for Type 2 Diabetes  States the symptoms of Type 2 Diabetes  Defines Type 2 Diabetes treatment goals  Defines Type 2 Diabetes treatment options  States the rationale for glucose monitoring  Identifies A1C, glucose targets, and testing times  Identifies proper sharps disposal  Defines the purpose of a diabetes food plan  Identifies carbohydrate food groups  Defines effects of carbohydrate foods on glucose levels  Identifies carbohydrate choices/grams/food labels  States benefits of physical activity and effect on glucose  Review of suggested activity guidelines  Handouts given during class include:  Type 2 Diabetes: Basics Book  My Food Plan Book  Food and Activity Log   Follow-Up Plan: Attend Core Classes 2 & 3

## 2013-02-21 ENCOUNTER — Encounter: Payer: BC Managed Care – PPO | Attending: Unknown Physician Specialty

## 2013-02-21 DIAGNOSIS — E119 Type 2 diabetes mellitus without complications: Secondary | ICD-10-CM | POA: Insufficient documentation

## 2013-02-21 DIAGNOSIS — Z713 Dietary counseling and surveillance: Secondary | ICD-10-CM | POA: Insufficient documentation

## 2013-06-14 ENCOUNTER — Other Ambulatory Visit: Payer: Self-pay | Admitting: *Deleted

## 2013-06-14 ENCOUNTER — Ambulatory Visit (INDEPENDENT_AMBULATORY_CARE_PROVIDER_SITE_OTHER): Payer: BC Managed Care – PPO | Admitting: Endocrinology

## 2013-06-14 ENCOUNTER — Encounter: Payer: Self-pay | Admitting: Endocrinology

## 2013-06-14 VITALS — BP 152/84 | HR 68 | Temp 98.3°F | Resp 12 | Ht 72.0 in | Wt 305.9 lb

## 2013-06-14 DIAGNOSIS — E785 Hyperlipidemia, unspecified: Secondary | ICD-10-CM

## 2013-06-14 DIAGNOSIS — G4733 Obstructive sleep apnea (adult) (pediatric): Secondary | ICD-10-CM

## 2013-06-14 DIAGNOSIS — I10 Essential (primary) hypertension: Secondary | ICD-10-CM

## 2013-06-14 NOTE — Patient Instructions (Addendum)
Please check blood sugars at least half the time about 2 hours after any meal and as directed on waking up. Please bring blood sugar monitor to each visit  Walk or other exercise daily  Trulicity 0.75 mg weekly injection

## 2013-06-14 NOTE — Progress Notes (Signed)
Patient ID: Mark White, male   DOB: 25-Oct-1955, 57 y.o.   MRN: [id number]    Reason for Appointment : Consultation for Type 2 Diabetes  History of Present Illness          Diagnosis: Type 2 diabetes mellitus, date of diagnosis:   2013        Past history:  At diagnosis he was having borderline hyperglycemia. Over the last several months he has been taking metformin from his PCP. He was prescribed this twice a day but would tend to forget and take it once a day. His A1c several months ago was 7.2 and recently 7.5 Currently is having no unusual symptoms but does have dry mouth, no frequent urination or blurred vision He is referred here for further management  Oral hypoglycemic drugs the patient is taking are: Metformin 500 mg daily    Side effects from medications have been: None      Compliance with the medical regimen:  Fair   Glucose monitoring:  done sporadically       Glucometer: ? FreeStyle     Blood Glucose readings from meter download: readings before breakfast:  130- 160 at various times     Hypoglycemia frequency: Never.          Diabetes control: A1c on 05/16/13 was 7.5, previously was 7.2 Self-care: The diet that the patient has been following is: none  Meals: 3 meals per day.    Diet history: He complains of hunger frequently. Typical breakfast is an egg biscuit, lunch is high food and dinner is [place] food    Physical activity: exercise:. none, not motivated         Dietician visit: Most recent: never has been to a diabetes class once in July.                Retinal exam: Most recent: About 2 years    Urine microalbumin: Normal in 10/14  Weight history: Usually ranging from 290-305  Wt Readings from Last 3 Encounters:  06/14/13 305 lb 14.4 oz (138.755 kg)  12/20/12 306 lb 8 oz (139.027 kg)  10/10/12 300 lb (136.079 kg)      Medication List       This list is accurate as of: 06/14/13 11:57 AM.  Always use your most recent med list.                aspirin 81 MG tablet  Take 1 tablet (81 mg total) by mouth daily. STOP TAKING AND RESTART Dec 29 2011     atorvastatin 40 MG tablet  Commonly known as:  LIPITOR  Take 40 mg by mouth daily.     fenofibrate 145 MG tablet  Commonly known as:  TRICOR  Take 145 mg by mouth daily.     metFORMIN 500 MG tablet  Commonly known as:  GLUCOPHAGE  Take 500 mg by mouth 2 (two) times daily with a meal.     valsartan 160 MG tablet  Commonly known as:  DIOVAN  Take 160 mg by mouth daily.        Allergies: No Known Allergies  Past Medical History  Diagnosis Date  . Hypertension   . Hyperlipidemia   . Personal history of adenomatous colonic polyps 01/19/2011  . Ulcer     30 years ago  . Sleep apnea     not using CPAP  . Diabetes mellitus without complication     Past Surgical History  Procedure Laterality Date  .  Colonoscopy w/ polypectomy  01/13/11    3 polyps, largest 3 cm cecal polyp - adenomas  . Colonoscopy  12/14/2011    Procedure: COLONOSCOPY;  Surgeon: Iva Boop, MD;  Location: [facility];  Service: Endoscopy;  Laterality: N/A;  colon wiht APC for follow up of colon polyps  . Hot hemostasis  12/14/2011    Procedure: HOT HEMOSTASIS (ARGON PLASMA COAGULATION/BICAP);  Surgeon: Iva Boop, MD;  Location: [facility];  Service: Endoscopy;  Laterality: N/A;  . Colonoscopy N/A 10/10/2012    Procedure: COLONOSCOPY;  Surgeon: Iva Boop, MD;  Location: [facility];  Service: Endoscopy;  Laterality: N/A;  Need APC    Family History  Problem Relation Age of Onset  . Colon cancer Mother 76  . Obesity Neg Hx   . Sleep apnea Neg Hx   . Stroke Neg Hx   . Heart attack Neg Hx     Social History:  reports that he has never smoked. He has never used smokeless tobacco. He reports that he drinks alcohol. He reports that he does not use illicit drugs.    Review of Systems       Lipids: Has been on Lipitor for about 10 years for hypercholesterolemia. Recent LDL 99 with normal  triglycerides  But low HDL of 29                  Skin: No rash or infections     Thyroid:   Fatigue present, may feel sleepy in the daytime. TSH in 10/14 was 2.4     The blood pressure has been high for 2 years and has been treated with Diovan 160 mg recent blood pressure with PCP was 116/74      No swelling of feet.     No shortness of breath on exertion.     Bowel habits: Normal.       No frequency of urination or nocturia       No joint  pains.    He has had difficulty with decreased libido for the last year or so. No dysfunction. Currently not wanting treatment  Also complaining of periodic sweating episodes, infrequent without any hot flashes    Some insomnia present, has had sleep apnea diagnosed before and has history of snoring. Was given CPAP mask but since he had lost weight a few years ago he stopped using it and has not consider treating it again recently         No history of Numbness, tingling or burning in feet       No history of depression but complains of having stress   Physical Examination:  BP 152/84  Pulse 68  Temp(Src) 98.3 F (36.8 C)  Resp 12  Ht 6' (1.829 m)  Wt 305 lb 14.4 oz (138.755 kg)  BMI 41.48 kg/m2  SpO2 97%  GENERAL:         Patient has marked generalized obesity.   HEENT:         Eye exam shows normal external appearance. Fundus exam is somewhat limited because of small pupils but no obvious retinopathy. Oral exam shows normal mucosa; oropharyngeal opening not visible because of large tongue   NECK:         General:  Neck exam shows no lymphadenopathy. Carotids are normal to palpation and no bruit heard.  Thyroid is not enlarged and no nodules felt.   LUNGS:         Chest is symmetrical. Lungs  are clear to auscultation.Marland Kitchen   HEART:         Heart sounds:  S1 and S2 are normal. No murmurs or clicks heard., no S3 or S4.   ABDOMEN:   There is no distention present. Liver and spleen are not palpable. No other mass or tenderness present.   EXTREMITIES:     There is no edema. No skin lesions present.Marland Kitchen  NEUROLOGICAL:        Vibration sense is mildly reduced in toes. Ankle jerks are absent bilaterally.          Diabetic foot exam:  as in the foot exam section MUSCULOSKELETAL:       There is no enlargement or deformity of the joints. Spine is normal to inspection.Marland Kitchen   PEDAL pulses: Normal SKIN:       No rash or lesions of concern.        ASSESSMENT:  Diabetes type 2, uncontrolled   He has had diabetes for about a year and currently  is an only low dose metformin, 500 mg daily  Although his A1c is only modestly increased at 7.5 recently  he has not responded well to low dose metformin He does have significant issues with not following any diet or doing any exercise program as well as difficulty losing weight.  He does admit to have difficulty controlling portions at mealtimes Does appears to have relatively high fat diet. Has had minimal diabetes education  Complications: None  Obesity: This is a significant problem and causing multiple other issues related to insulin resistance syndrome as well as sleep apnea. He is not exercising or able to lose weight with previous session of diabetes education. Had minimal insight into multiple health problems created by obesity   Hypertension: This has been previously controlled but blood pressure is relatively higher today  Hyperlipidemia: Reasonably well-controlled with LDL below 100 but had low HDL of 29, discussed importance of weight loss and exercise in improving this  Decreased libido, sweating episodes, likely related to hypogonadism as a result of his metabolic syndrome/obesity   Sleep apnea, previously diagnosed and treated with CPAP. Was previously doing symptomatically better with weight loss   Currently not using this and having symptoms of afternoon drowsiness  PLAN:   He will try to see a dietitian at work for better meal planning and weight loss  Start exercise program  with walking or other activities May take metformin 500 mg tablets together in the morning for better compliance and consider using extended release on the next visit Monitor blood sugar regularly alternating fasting and postprandial readings, to bring monitor for review on the next visit  Have discussed with the patient the use of GLP-1 drugs and the mechanism of how they work and benefit blood glucose as well as potentially help with weight loss, increase satiety and gastric fullness. He will be given a  Trial of 0.75 Trulicity weekly Explained possible side effects and safety information .  Discussed usage of the autoinjector.  Patient starter kit and co-pay card given  Sleep apnea: Advised him to followup with his pulmonologist for restarting treatment. Discussed importance of treating this and also the role of weight loss and improving condition  Total visit time including counseling = 60 minutes  Khi Mcmillen 06/14/2013, 11:57 AM

## 2013-06-16 DIAGNOSIS — E785 Hyperlipidemia, unspecified: Secondary | ICD-10-CM | POA: Insufficient documentation

## 2013-06-16 DIAGNOSIS — I10 Essential (primary) hypertension: Secondary | ICD-10-CM | POA: Insufficient documentation

## 2013-06-16 DIAGNOSIS — G4733 Obstructive sleep apnea (adult) (pediatric): Secondary | ICD-10-CM | POA: Insufficient documentation

## 2013-07-16 ENCOUNTER — Other Ambulatory Visit (INDEPENDENT_AMBULATORY_CARE_PROVIDER_SITE_OTHER): Payer: BC Managed Care – PPO

## 2013-07-17 LAB — BASIC METABOLIC PANEL
CO2: 28 mEq/L (ref 19–32)
Calcium: 9.1 mg/dL (ref 8.4–10.5)
Chloride: 106 mEq/L (ref 96–112)
Glucose, Bld: 165 mg/dL — ABNORMAL HIGH (ref 70–99)
Sodium: 138 mEq/L (ref 135–145)

## 2013-07-19 ENCOUNTER — Ambulatory Visit (INDEPENDENT_AMBULATORY_CARE_PROVIDER_SITE_OTHER): Payer: BC Managed Care – PPO | Admitting: Endocrinology

## 2013-07-19 ENCOUNTER — Encounter: Payer: Self-pay | Admitting: Endocrinology

## 2013-07-19 VITALS — BP 140/66 | HR 77 | Temp 98.3°F | Resp 12 | Ht 72.0 in | Wt 305.2 lb

## 2013-07-19 DIAGNOSIS — R6882 Decreased libido: Secondary | ICD-10-CM

## 2013-07-19 MED ORDER — DULAGLUTIDE 1.5 MG/0.5ML ~~LOC~~ SOAJ: 1.5000 mg | SUBCUTANEOUS | Status: DC

## 2013-07-19 MED ORDER — METFORMIN HCL ER 750 MG PO TB24: 1500.0000 mg | ORAL_TABLET | Freq: Every day | ORAL | Status: DC

## 2013-07-19 NOTE — Patient Instructions (Addendum)
Trulicity start 1.5mg  weekly  Please check blood sugars at least half the time about 2 hours after any meal and weekly on waking up. Please bring blood sugar monitor to each visit

## 2013-07-19 NOTE — Progress Notes (Signed)
Patient ID: Mark White, male   DOB: 06-Oct-1955, 57 y.o.   MRN: [id number]   Reason for Appointment : Followup for Type 2 Diabetes  History of Present Illness          Diagnosis: Type 2 diabetes mellitus, date of diagnosis:   2013        Past history:  At diagnosis he was having borderline hyperglycemia. Over the last several months he has been taking metformin from his PCP. He was prescribed this twice a day but would tend to forget and take it once a day. His A1c several months ago was 7.2 and prior to his consultation was 7.5  Recent history: Because of his relatively high A1c and obesity he was started on Trulicity 0.75 mg weekly. Also has had  better compliance with twice a day metformin He is tolerating this well at present However although he felt more satiety in the beginning he is not able to control his hunger now Has not lost any weight Also has not been monitoring his blood sugar as directed  Oral hypoglycemic drugs the patient is taking are: Metformin 500 mg twice a day Side effects from medications have been: None      Compliance with the medical regimen:  Fair   Glucose monitoring:  done sporadically       Glucometer: ? FreeStyle     Blood Glucose readings ? 160 nonfasting, not checking. Lab glucose 165 PC lunch Hypoglycemia frequency: Never.          Diabetes control: A1c on 05/16/13 was 7.5, previously was 7.2  Self-care: The diet that the patient has been following is: Now on [employer]  Meals: 3 meals per day.    Diet history: He complains of hunger frequently. Typical breakfast is an egg biscuit Physical activity: exercise: minimal        Dietician visit: Most recent: never has been to a diabetes class once in July.          Retinal exam: Most recent: About 2 years    Urine microalbumin: Normal in 10/14  Weight history: Usually ranging from 290-305  Wt Readings from Last 3 Encounters:  07/19/13 305 lb 3.2 oz (138.438 kg)  06/14/13 305 lb 14.4 oz (138.755  kg)  12/20/12 306 lb 8 oz (139.027 kg)   Appointment on 07/16/2013  Component Date Value Range Status  . Sodium 07/16/2013 138  135 - 145 mEq/L Final  . Potassium 07/16/2013 4.0  3.5 - 5.1 mEq/L Final  . Chloride 07/16/2013 106  96 - 112 mEq/L Final  . CO2 07/16/2013 28  19 - 32 mEq/L Final  . Glucose, Bld 07/16/2013 165* 70 - 99 mg/dL Final  . BUN 14/78/2956 14  6 - 23 mg/dL Final  . Creatinine, Ser 07/16/2013 1.4  0.4 - 1.5 mg/dL Final  . Calcium 21/30/8657 9.1  8.4 - 10.5 mg/dL Final  . GFR 84/69/6295 53.99* >60.00 mL/min Final  . Fructosamine 07/16/2013 199  <285 umol/L Final   Comment:                            Variations in levels of serum proteins (albumin and immunoglobulins)                          may affect fructosamine results.  Medication List       This list is accurate as of: 07/19/13 10:14 AM.  Always use your most recent med list.               aspirin 81 MG tablet  Take 1 tablet (81 mg total) by mouth daily. STOP TAKING AND RESTART Dec 29 2011     atorvastatin 40 MG tablet  Commonly known as:  LIPITOR  Take 40 mg by mouth daily.     fenofibrate 145 MG tablet  Commonly known as:  TRICOR  Take 145 mg by mouth daily.     metFORMIN 500 MG tablet  Commonly known as:  GLUCOPHAGE  Take 500 mg by mouth 2 (two) times daily with a meal.     TRULICITY 0.75 MG/0.5ML Sopn  Generic drug:  Dulaglutide     valsartan 160 MG tablet  Commonly known as:  DIOVAN  Take 160 mg by mouth daily.        Allergies: No Known Allergies  Past Medical History  Diagnosis Date  . Hypertension   . Hyperlipidemia   . Personal history of adenomatous colonic polyps 01/19/2011  . Ulcer     30 years ago  . Sleep apnea     not using CPAP  . Diabetes mellitus without complication     Past Surgical History  Procedure Laterality Date  . Colonoscopy w/ polypectomy  01/13/11    3 polyps, largest 3 cm cecal polyp - adenomas  . Colonoscopy   12/14/2011    Procedure: COLONOSCOPY;  Surgeon: Iva Boop, MD;  Location: [facility];  Service: Endoscopy;  Laterality: N/A;  colon wiht APC for follow up of colon polyps  . Hot hemostasis  12/14/2011    Procedure: HOT HEMOSTASIS (ARGON PLASMA COAGULATION/BICAP);  Surgeon: Iva Boop, MD;  Location: [facility];  Service: Endoscopy;  Laterality: N/A;  . Colonoscopy N/A 10/10/2012    Procedure: COLONOSCOPY;  Surgeon: Iva Boop, MD;  Location: [facility];  Service: Endoscopy;  Laterality: N/A;  Need APC    Family History  Problem Relation Age of Onset  . Colon cancer Mother 68  . Obesity Neg Hx   . Sleep apnea Neg Hx   . Stroke Neg Hx   . Heart attack Neg Hx     Social History:  reports that he has never smoked. He has never used smokeless tobacco. He reports that he drinks alcohol. He reports that he does not use illicit drugs.    Review of Systems       Lipids: Has been on Lipitor for about 10 years for hypercholesterolemia. Recent LDL 99 with normal triglycerides  But low HDL of 29                Sleep apnea: He is starting to use his CPAP and his fatigue is much better   Decreased libido, sweating episodes, likely related to hypogonadism as a result of his metabolic syndrome/obesity    Physical Examination:  BP 140/66  Pulse 77  Temp(Src) 98.3 F (36.8 C)  Resp 12  Ht 6' (1.829 m)  Wt 305 lb 3.2 oz (138.438 kg)  BMI 41.38 kg/m2  SpO2 96%   ASSESSMENT:  Diabetes type 2, uncontrolled   He has had recent onset diabetes with obesity His fructosamine indicates excellent control with current regimen of metformin 1500 mg and Trulicity 0.7 mg weekly However he has not lost any weight despite starting on a Weight [employer] diet recently  He is also not exercising Has had minimal diabetes education  Complications: None  Hypertension:  on treatment and blood pressure is relatively better today, possibly from using CPAP   Hyperlipidemia: Reasonably  well-controlled with LDL below 100 but had low HDL of 29, discussed importance of weight loss and exercise in improving this  Sleep apnea, has resumed treatment with CPAP.  PLAN:   Changing metformin to metformin ER 1500 mg daily Start exercise program with walking or other activities Increase Trulicity to 1.5 mg   Rhema Boyett 07/19/2013, 10:14 AM

## 2013-09-19 ENCOUNTER — Other Ambulatory Visit (INDEPENDENT_AMBULATORY_CARE_PROVIDER_SITE_OTHER): Payer: BC Managed Care – PPO

## 2013-09-19 DIAGNOSIS — E1165 Type 2 diabetes mellitus with hyperglycemia: Principal | ICD-10-CM

## 2013-09-19 DIAGNOSIS — IMO0001 Reserved for inherently not codable concepts without codable children: Secondary | ICD-10-CM

## 2013-09-19 DIAGNOSIS — R6882 Decreased libido: Secondary | ICD-10-CM

## 2013-09-19 LAB — BASIC METABOLIC PANEL
BUN: 15 mg/dL (ref 6–23)
CO2: 24 mEq/L (ref 19–32)
CREATININE: 1.1 mg/dL (ref 0.4–1.5)
Calcium: 9.6 mg/dL (ref 8.4–10.5)
Chloride: 107 mEq/L (ref 96–112)
GFR: 75.41 mL/min (ref >60.00)
Glucose, Bld: 97 mg/dL (ref 70–99)
POTASSIUM: 3.9 meq/L (ref 3.5–5.1)
Sodium: 138 mEq/L (ref 135–145)

## 2013-09-19 LAB — HEMOGLOBIN A1C: Hgb A1c MFr Bld: 6.4 % (ref 4.6–6.5)

## 2013-09-20 LAB — TESTOSTERONE, FREE, TOTAL, SHBG
Sex Hormone Binding: 31 nmol/L (ref 13–71)
TESTOSTERONE: 240 ng/dL — AB (ref 300–890)
Testosterone, Free: 48 pg/mL (ref 47.0–244.0)
Testosterone-% Free: 2 % (ref 1.6–2.9)

## 2013-09-25 ENCOUNTER — Ambulatory Visit (INDEPENDENT_AMBULATORY_CARE_PROVIDER_SITE_OTHER): Payer: BC Managed Care – PPO | Admitting: Endocrinology

## 2013-09-25 ENCOUNTER — Encounter: Payer: Self-pay | Admitting: Endocrinology

## 2013-09-25 VITALS — BP 124/68 | HR 78 | Temp 97.6°F | Resp 16 | Ht 72.0 in | Wt 297.6 lb

## 2013-09-25 DIAGNOSIS — E785 Hyperlipidemia, unspecified: Secondary | ICD-10-CM

## 2013-09-25 DIAGNOSIS — E1165 Type 2 diabetes mellitus with hyperglycemia: Principal | ICD-10-CM

## 2013-09-25 DIAGNOSIS — IMO0001 Reserved for inherently not codable concepts without codable children: Secondary | ICD-10-CM

## 2013-09-25 DIAGNOSIS — I10 Essential (primary) hypertension: Secondary | ICD-10-CM

## 2013-09-25 NOTE — Patient Instructions (Signed)
Exercise program  Please check blood sugars at least half the time about 2 hours after any meal and as directed on waking up. Please bring blood sugar monitor to each visit

## 2013-09-25 NOTE — Progress Notes (Signed)
Patient ID: Mark White, male   DOB: July 02, 1956, 58 y.o.   MRN: [id number]   Reason for Appointment : Followup for Type 2 Diabetes  History of Present Illness          Diagnosis: Type 2 diabetes mellitus, date of diagnosis:   2013        Past history:  At diagnosis he was having borderline hyperglycemia. He had previously been  taking metformin from his PCP. He was prescribed this twice a day but would tend to forget and take it once a day.  His A1c in early 2014 was 7.2    Recent history: Because of his relatively high A1c of 7.5 and obesity he was started on Trulicity [id number] mg weekly.  Also have been asked to comply better with twice a day metformin He however did not have any weight loss with a A999333 mg Trulicity and this was increased to 1.5 mg in 12/14 Also did not have persistent benefit on.control with the 0.75 mg. He has now started to lose weight Has continued to try and follow a Weight Watchers program  Oral hypoglycemic drugs the patient is taking are: Metformin ? 500 mg twice a day Side effects from medications have been: None      Compliance with the medical regimen:  Fair   Glucose monitoring:  done sporadically       Glucometer: ? FreeStyle     Blood Glucose readings:  Fasting: 99, 108, dinnertime 83, p.c. supper 174, after Superbowl party 247 Hypoglycemia: Never.           Self-care: The diet that the patient has been following is: Now on [employer]  Meals: 3 meals per day.    Diet history:  Typical breakfast is an egg biscuit Physical activity: exercise: rare        Dietician visit: Most recent: never has been to a diabetes class once in July.          Retinal exam: Most recent: About 2 years    Urine microalbumin: Normal in 10/14  Weight history: Usually ranging from 290-305  Wt Readings from Last 3 Encounters:  09/25/13 297 lb 9.6 oz (134.99 kg)  07/19/13 305 lb 3.2 oz (138.438 kg)  06/14/13 305 lb 14.4 oz (138.755 kg)   Diabetes control: A1c on 05/16/13  was 7.5, previously was 7.2  Lab Results  Component Value Date   HGBA1C 6.4 09/19/2013   Lab Results  Component Value Date   CREATININE 1.1 09/19/2013    Appointment on 09/19/2013  Component Date Value Ref Range Status  . Hemoglobin A1C 09/19/2013 6.4  4.6 - 6.5 % Final   Glycemic Control Guidelines for People with Diabetes:Non Diabetic:  <6%Goal of Therapy: <7%Additional Action Suggested:  >8%   . Sodium 09/19/2013 138  135 - 145 mEq/L Final  . Potassium 09/19/2013 3.9  3.5 - 5.1 mEq/L Final  . Chloride 09/19/2013 107  96 - 112 mEq/L Final  . CO2 09/19/2013 24  19 - 32 mEq/L Final  . Glucose, Bld 09/19/2013 97  70 - 99 mg/dL Final  . BUN 09/19/2013 15  6 - 23 mg/dL Final  . Creatinine, Ser 09/19/2013 1.1  0.4 - 1.5 mg/dL Final  . Calcium 09/19/2013 9.6  8.4 - 10.5 mg/dL Final  . GFR 09/19/2013 75.41  >60.00 mL/min Final  . Testosterone 09/19/2013 240* 300 - 890 ng/dL Final   Comment:           Tanner Stage  Male              Male                                        I              < 30 ng/dL        < 10 ng/dL                                        II             < 150 ng/dL       < 30 ng/dL                                        III            100-320 ng/dL     < 35 ng/dL                                        IV             200-970 ng/dL     15-40 ng/dL                                        V/Adult        300-890 ng/dL     10-70 ng/dL                             . Sex Hormone Binding 09/19/2013 31  13 - 71 nmol/L Final  . Testosterone, Free 09/19/2013 48.0  47.0 - 244.0 pg/mL Final   Comment:                            The concentration of free testosterone is derived from a mathematical                          expression based on constants for the binding of testosterone to sex                          hormone-binding globulin and albumin.  . Testosterone-% Free 09/19/2013 2.0  1.6 - 2.9 % Final      Medication List       This list is accurate as of: 09/25/13   2:56 PM.  Always use your most recent med list.               aspirin 81 MG tablet  Take 1 tablet (81 mg total) by mouth daily. STOP TAKING AND RESTART Dec 29 2011     atorvastatin 40 MG tablet  Commonly known as:  LIPITOR  Take 40 mg by mouth daily.     Dulaglutide 1.5 MG/0.5ML Sopn  Commonly known as:  TRULICITY  Inject 1.5 mg into the skin once a  week.     fenofibrate 145 MG tablet  Commonly known as:  TRICOR  Take 145 mg by mouth daily.     metFORMIN 750 MG 24 hr tablet  Commonly known as:  GLUCOPHAGE-XR  Take 2 tablets (1,500 mg total) by mouth daily with breakfast.     valsartan 160 MG tablet  Commonly known as:  DIOVAN  Take 160 mg by mouth daily.        Allergies: No Known Allergies  Past Medical History  Diagnosis Date  . Hypertension   . Hyperlipidemia   . Personal history of adenomatous colonic polyps 01/19/2011  . Ulcer     30 years ago  . Sleep apnea     not using CPAP  . Diabetes mellitus without complication     Past Surgical History  Procedure Laterality Date  . Colonoscopy w/ polypectomy  01/13/11    3 polyps, largest 3 cm cecal polyp - adenomas  . Colonoscopy  12/14/2011    Procedure: COLONOSCOPY;  Surgeon: Gatha Mayer, MD;  Location: [facility];  Service: Endoscopy;  Laterality: N/A;  colon wiht APC for follow up of colon polyps  . Hot hemostasis  12/14/2011    Procedure: HOT HEMOSTASIS (ARGON PLASMA COAGULATION/BICAP);  Surgeon: Gatha Mayer, MD;  Location: [facility];  Service: Endoscopy;  Laterality: N/A;  . Colonoscopy N/A 10/10/2012    Procedure: COLONOSCOPY;  Surgeon: Gatha Mayer, MD;  Location: [facility];  Service: Endoscopy;  Laterality: N/A;  Need APC    Family History  Problem Relation Age of Onset  . Colon cancer Mother 54  . Obesity Neg Hx   . Sleep apnea Neg Hx   . Stroke Neg Hx   . Heart attack Neg Hx     Social History:  reports that he has never smoked. He has never used smokeless tobacco. He reports that  he drinks alcohol. He reports that he does not use illicit drugs.    Review of Systems       Lipids: Has been on Lipitor for about 10 years for hypercholesterolemia. Recent LDL 99 with normal triglycerides Has low HDL of 29                Sleep apnea: He is using his CPAP and his fatigue is much better; now he thinks he can get by with less frequent use   Decreased libido, sweating episodes are better now since he has lost weight Free testosterone level is low normal   Physical Examination:  BP 124/68  Pulse 78  Temp(Src) 97.6 F (36.4 C)  Resp 16  Ht 6' (1.829 m)  Wt 297 lb 9.6 oz (134.99 kg)  BMI 40.35 kg/m2  SpO2 96%   ASSESSMENT:  Diabetes type 2, uncontrolled   He has had type II diabetes of recent onset  with obesity He has lost weight since the dose of Trulicity was increased His A1c indicates excellent control with current regimen of metformin  and Trulicity 1.5 mg weekly However he has not lost any weight despite starting on a Weight Watchers diet recently He is currently not exercising and he was looking to getting an exercise bike for home He does not want to go back for dietary counseling since he found that it was not covered by insurance  Hypogonadism: This is mild and subjectively he is having the symptoms with weight loss.  PLAN:   He can change metformin to metformin ER 1500 mg daily Start exercise program  with walking or other activities Continued Trulicity More consistent glucose monitoring   Jacqulene Huntley 09/25/2013, 2:56 PM

## 2013-12-24 ENCOUNTER — Other Ambulatory Visit (INDEPENDENT_AMBULATORY_CARE_PROVIDER_SITE_OTHER): Payer: BC Managed Care – PPO

## 2013-12-24 DIAGNOSIS — E1165 Type 2 diabetes mellitus with hyperglycemia: Principal | ICD-10-CM

## 2013-12-24 DIAGNOSIS — IMO0001 Reserved for inherently not codable concepts without codable children: Secondary | ICD-10-CM

## 2013-12-24 LAB — LIPID PANEL
Cholesterol: 177 mg/dL (ref 0–200)
HDL: 28 mg/dL — AB (ref >39.00)
LDL Cholesterol: 122 mg/dL — ABNORMAL HIGH (ref 0–99)
TRIGLYCERIDES: 137 mg/dL (ref 0.0–149.0)
Total CHOL/HDL Ratio: 6
VLDL: 27.4 mg/dL (ref 0.0–40.0)

## 2013-12-24 LAB — MICROALBUMIN / CREATININE URINE RATIO
Creatinine,U: 155.3 mg/dL
Microalb Creat Ratio: 0.5 mg/g (ref 0.0–30.0)
Microalb, Ur: 0.7 mg/dL (ref 0.0–1.9)

## 2013-12-24 LAB — COMPREHENSIVE METABOLIC PANEL
ALT: 38 U/L (ref 0–53)
AST: 27 U/L (ref 0–37)
Albumin: 3.9 g/dL (ref 3.5–5.2)
Alkaline Phosphatase: 65 U/L (ref 39–117)
BUN: 15 mg/dL (ref 6–23)
CALCIUM: 9.4 mg/dL (ref 8.4–10.5)
CHLORIDE: 108 meq/L (ref 96–112)
CO2: 27 meq/L (ref 19–32)
CREATININE: 1 mg/dL (ref 0.4–1.5)
GFR: 77.85 mL/min (ref >60.00)
GLUCOSE: 105 mg/dL — AB (ref 70–99)
Potassium: 3.6 mEq/L (ref 3.5–5.1)
Sodium: 141 mEq/L (ref 135–145)
Total Bilirubin: 0.5 mg/dL (ref 0.2–1.2)
Total Protein: 6.6 g/dL (ref 6.0–8.3)

## 2013-12-24 LAB — HEMOGLOBIN A1C: Hgb A1c MFr Bld: 6.8 % — ABNORMAL HIGH (ref 4.6–6.5)

## 2013-12-27 ENCOUNTER — Ambulatory Visit (INDEPENDENT_AMBULATORY_CARE_PROVIDER_SITE_OTHER): Payer: BC Managed Care – PPO | Admitting: Endocrinology

## 2013-12-27 ENCOUNTER — Encounter: Payer: Self-pay | Admitting: Endocrinology

## 2013-12-27 VITALS — BP 132/60 | HR 76 | Temp 97.8°F | Resp 16 | Ht 72.0 in | Wt 304.8 lb

## 2013-12-27 DIAGNOSIS — E669 Obesity, unspecified: Secondary | ICD-10-CM

## 2013-12-27 DIAGNOSIS — E1165 Type 2 diabetes mellitus with hyperglycemia: Principal | ICD-10-CM

## 2013-12-27 DIAGNOSIS — IMO0001 Reserved for inherently not codable concepts without codable children: Secondary | ICD-10-CM

## 2013-12-27 DIAGNOSIS — E785 Hyperlipidemia, unspecified: Secondary | ICD-10-CM

## 2013-12-27 LAB — GLUCOSE, POCT (MANUAL RESULT ENTRY): POC GLUCOSE: 170 mg/dL — AB (ref 70–99)

## 2013-12-27 NOTE — Progress Notes (Signed)
Patient ID: Mark White, male   DOB: 1955/11/05, 58 y.o.   MRN: [id number]   Reason for Appointment : Followup for Type 2 Diabetes  History of Present Illness          Diagnosis: Type 2 diabetes mellitus, date of diagnosis:   2013        Past history:  At diagnosis he was having borderline hyperglycemia. He had previously been taking metformin from his PCP. He was prescribed this twice a day but would tend to forget and take it once a day.  His A1c in early 2014 was 7.2   Because of his relatively high A1c of 7.5 and obesity he was started on Trulicity 9.37 mg weekly in 06/2013  Recent history: . Since he did not have any weight loss with 1.69 mg Trulicity  this was increased to 1.5 mg in 12/14 On his last visit in 2/15 he had been starting to lose weight and his blood sugar control was improving with A1c in the normal range He was also trying to follow a Weight Watchers diet  However since his last visit he has been relatively noncompliant in all regards For the last month he completely forgot to restart his Trulicity after his vacation Has not been watching his diet consistently and weight has gone back up to baseline Also not doing any programmed exercise as discussed previously His compliance with metformin has been inconsistent in taking this only once a day even after switching to an extended release preparation  Oral hypoglycemic drugs the patient is taking are: Metformin ER 750 mg Side effects from medications have been: None      Compliance with the medical regimen:  Fair   Glucose monitoring:  done sporadically       Glucometer: ? FreeStyle     Blood Glucose readings:  Fasting:100-105, none after meals Hypoglycemia: Never.           Self-care: The diet that the patient has been following is: none, eating out more until recently, also more stress until recently  Meals: 3 meals per day.    Diet history:  Typical breakfast is an egg biscuit Physical activity: exercise:  housework        Dietician visit: Most recent: never has been to a diabetes class once in July.          Retinal exam: Most recent: About 2 years    Urine microalbumin: Normal in 10/14  Weight history: Usually ranging from 290-305  Wt Readings from Last 3 Encounters:  12/27/13 304 lb 12.8 oz (138.256 kg)  09/25/13 297 lb 9.6 oz (134.99 kg)  07/19/13 305 lb 3.2 oz (138.438 kg)   Diabetes control: A1c on 05/16/13 was 7.5, previously was 7.2  Lab Results  Component Value Date   HGBA1C 6.8* 12/24/2013   HGBA1C 6.4 09/19/2013   Lab Results  Component Value Date   MICROALBUR 0.7 12/24/2013   LDLCALC 122* 12/24/2013   CREATININE 1.0 12/24/2013    Appointment on 12/24/2013  Component Date Value Ref Range Status  . Hemoglobin A1C 12/24/2013 6.8* 4.6 - 6.5 % Final   Glycemic Control Guidelines for People with Diabetes:Non Diabetic:  <6%Goal of Therapy: <7%Additional Action Suggested:  >8%   . Sodium 12/24/2013 141  135 - 145 mEq/L Final  . Potassium 12/24/2013 3.6  3.5 - 5.1 mEq/L Final  . Chloride 12/24/2013 108  96 - 112 mEq/L Final  . CO2 12/24/2013 27  19 - 32 mEq/L Final  .  Glucose, Bld 12/24/2013 105* 70 - 99 mg/dL Final  . BUN 12/24/2013 15  6 - 23 mg/dL Final  . Creatinine, Ser 12/24/2013 1.0  0.4 - 1.5 mg/dL Final  . Total Bilirubin 12/24/2013 0.5  0.2 - 1.2 mg/dL Final  . Alkaline Phosphatase 12/24/2013 65  39 - 117 U/L Final  . AST 12/24/2013 27  0 - 37 U/L Final  . ALT 12/24/2013 38  0 - 53 U/L Final  . Total Protein 12/24/2013 6.6  6.0 - 8.3 g/dL Final  . Albumin 12/24/2013 3.9  3.5 - 5.2 g/dL Final  . Calcium 12/24/2013 9.4  8.4 - 10.5 mg/dL Final  . GFR 12/24/2013 77.85  >60.00 mL/min Final  . Cholesterol 12/24/2013 177  0 - 200 mg/dL Final   ATP III Classification       Desirable:  < 200 mg/dL               Borderline High:  200 - 239 mg/dL          High:  > = 240 mg/dL  . Triglycerides 12/24/2013 137.0  0.0 - 149.0 mg/dL Final   Normal:  <150 mg/dLBorderline High:   150 - 199 mg/dL  . HDL 12/24/2013 28.00* >39.00 mg/dL Final  . VLDL 12/24/2013 27.4  0.0 - 40.0 mg/dL Final  . LDL Cholesterol 12/24/2013 122* 0 - 99 mg/dL Final  . Total CHOL/HDL Ratio 12/24/2013 6   Final                  Men          Women1/2 Average Risk     3.4          3.3Average Risk          5.0          4.42X Average Risk          9.6          7.13X Average Risk          15.0          11.0                      . Microalb, Ur 12/24/2013 0.7  0.0 - 1.9 mg/dL Final  . Creatinine,U 12/24/2013 155.3   Final  . Microalb Creat Ratio 12/24/2013 0.5  0.0 - 30.0 mg/g Final      Medication List       This list is accurate as of: 12/27/13  2:53 PM.  Always use your most recent med list.               aspirin 81 MG tablet  Take 1 tablet (81 mg total) by mouth daily. STOP TAKING AND RESTART Dec 29 2011     atorvastatin 40 MG tablet  Commonly known as:  LIPITOR  Take 40 mg by mouth daily.     Dulaglutide 1.5 MG/0.5ML Sopn  Commonly known as:  TRULICITY  Inject 1.5 mg into the skin once a week.     fenofibrate 145 MG tablet  Commonly known as:  TRICOR  Take 145 mg by mouth daily.     metFORMIN 750 MG 24 hr tablet  Commonly known as:  GLUCOPHAGE-XR  Take 2 tablets (1,500 mg total) by mouth daily with breakfast.     valsartan 160 MG tablet  Commonly known as:  DIOVAN  Take 160 mg by mouth daily.        Allergies:  No Known Allergies  Past Medical History  Diagnosis Date  . Hypertension   . Hyperlipidemia   . Personal history of adenomatous colonic polyps 01/19/2011  . Ulcer     30 years ago  . Sleep apnea     not using CPAP  . Diabetes mellitus without complication     Past Surgical History  Procedure Laterality Date  . Colonoscopy w/ polypectomy  01/13/11    3 polyps, largest 3 cm cecal polyp - adenomas  . Colonoscopy  12/14/2011    Procedure: COLONOSCOPY;  Surgeon: Gatha Mayer, MD;  Location: [facility];  Service: Endoscopy;  Laterality: N/A;  colon wiht APC  for follow up of colon polyps  . Hot hemostasis  12/14/2011    Procedure: HOT HEMOSTASIS (ARGON PLASMA COAGULATION/BICAP);  Surgeon: Gatha Mayer, MD;  Location: [facility];  Service: Endoscopy;  Laterality: N/A;  . Colonoscopy N/A 10/10/2012    Procedure: COLONOSCOPY;  Surgeon: Gatha Mayer, MD;  Location: [facility];  Service: Endoscopy;  Laterality: N/A;  Need APC    Family History  Problem Relation Age of Onset  . Colon cancer Mother 101  . Obesity Neg Hx   . Sleep apnea Neg Hx   . Stroke Neg Hx   . Heart attack Neg Hx     Social History:  reports that he has never smoked. He has never used smokeless tobacco. He reports that he drinks alcohol. He reports that he does not use illicit drugs.    Review of Systems       Lipids: Has been on Lipitor for about 10 years for hypercholesterolemia. Recent LDL 99 with normal triglycerides Has low HDL of 29                Sleep apnea: He is using his CPAP and his fatigue is much better; now he thinks he can get by with less frequent use   Decreased libido, sweating episodes are better now since he has lost weight Free testosterone level  low normal   Physical Examination:  BP 132/60  Pulse 76  Temp(Src) 97.8 F (36.6 C)  Resp 16  Ht 6' (1.829 m)  Wt 304 lb 12.8 oz (138.256 kg)  BMI 41.33 kg/m2  SpO2 95%   ASSESSMENT:  Diabetes type 2, uncontrolled   He has had type II diabetes of recent onset  with obesity As discussed in history of present illness his compliance has been suboptimal and his weight and A1c had gone up He has been not very motivated to do all the necessary self-care changes including medications for control and weight loss He was counseled for about 15 minutes about compliance, progression of diabetes, various aspects of self-care and glucose monitoring  Hyperlipidemia: On medication but has persistently low HDL which should improve with weight loss and exercise Discussed improving diet also  PLAN:    More consistent glucose monitoring; check blood sugars at least half the time about 2 hours after any meal and as directed on waking up. He will bring blood sugar monitor to each visit  Exercise as directed  Take 2 Metformin in am; restart Trulicity weekly  Get eye exam; schedule time with dietician  Low saturated fat diet  Counseling time over 50% of today's 25 minute visit  Elayne Snare 12/27/2013, 2:53 PM

## 2013-12-27 NOTE — Patient Instructions (Addendum)
Please check blood sugars at least half the time about 2 hours after any meal and as directed on waking up. Please bring blood sugar monitor to each visit  Exercise as directed  Take 2 Metformin in am; restart Trulicity weekly  Get eye exam; schedule time with dietician  Low saturated fat diet

## 2014-03-25 ENCOUNTER — Other Ambulatory Visit (INDEPENDENT_AMBULATORY_CARE_PROVIDER_SITE_OTHER): Payer: BC Managed Care – PPO

## 2014-03-25 DIAGNOSIS — E1165 Type 2 diabetes mellitus with hyperglycemia: Principal | ICD-10-CM

## 2014-03-25 DIAGNOSIS — IMO0001 Reserved for inherently not codable concepts without codable children: Secondary | ICD-10-CM

## 2014-03-25 LAB — BASIC METABOLIC PANEL
BUN: 15 mg/dL (ref 6–23)
CALCIUM: 9.2 mg/dL (ref 8.4–10.5)
CO2: 27 mEq/L (ref 19–32)
Chloride: 106 mEq/L (ref 96–112)
Creatinine, Ser: 1.1 mg/dL (ref 0.4–1.5)
GFR: 70.68 mL/min (ref >60.00)
Glucose, Bld: 85 mg/dL (ref 70–99)
POTASSIUM: 3.8 meq/L (ref 3.5–5.1)
Sodium: 140 mEq/L (ref 135–145)

## 2014-03-25 LAB — HEMOGLOBIN A1C: HEMOGLOBIN A1C: 6.9 % — AB (ref 4.6–6.5)

## 2014-03-28 ENCOUNTER — Ambulatory Visit: Payer: BC Managed Care – PPO | Admitting: Endocrinology

## 2014-04-02 ENCOUNTER — Ambulatory Visit (INDEPENDENT_AMBULATORY_CARE_PROVIDER_SITE_OTHER): Payer: BC Managed Care – PPO | Admitting: Endocrinology

## 2014-04-02 ENCOUNTER — Encounter: Payer: Self-pay | Admitting: Endocrinology

## 2014-04-02 VITALS — BP 130/69 | HR 64 | Temp 98.7°F | Resp 16 | Ht 72.0 in | Wt 297.4 lb

## 2014-04-02 DIAGNOSIS — IMO0001 Reserved for inherently not codable concepts without codable children: Secondary | ICD-10-CM

## 2014-04-02 DIAGNOSIS — E1165 Type 2 diabetes mellitus with hyperglycemia: Principal | ICD-10-CM

## 2014-04-02 NOTE — Patient Instructions (Signed)
Get eye exam   Please check blood sugars at least half the time about 2 hours after any meal and 2 times per week on waking up. Please bring blood sugar monitor to each visit  Avoid cereals

## 2014-04-02 NOTE — Progress Notes (Signed)
Patient ID: Mark White, male   DOB: September 01, 1955, 58 y.o.   MRN: [id number]   Reason for Appointment : Followup for Type 2 Diabetes  History of Present Illness          Diagnosis: Type 2 diabetes mellitus, date of diagnosis:   2013        Past history:  At diagnosis he was having borderline hyperglycemia. He had previously been taking metformin from his PCP. He was prescribed this twice a day but would tend to forget and take it once a day.  His A1c in early 2014 was 7.2   Because of his relatively high A1c of 7.5 and obesity he was started on Trulicity 1.44 mg weekly in 06/2013  Recent history:  Since he did not have any weight loss with 8.18 mg Trulicity  this was increased to 1.5 mg in 12/14 In 2/15 he had been losing weight and his blood sugar control was improving with A1c in the normal range However in 5/15 his compliance for diet, exercise and medication was poor and he started gaining back weight with slight increase in A1c He had left off his Trulicity for a month  He is now trying to follow a Weight Watchers diet; trying to eat less high-fat foods and more sandwiches She does appear to have high readings after eating cereal and he is not doing this now Also overall has been trying to be a little more active His weight has come down but his A1c is still relatively high He now forgets to take his Trulicity and probably takes it only twice a month  Oral hypoglycemic drugs the patient is taking are: Metformin ER 750 mg, 2 tablets daily Side effects from medications have been: None      Compliance with the medical regimen:  Fair   Glucose monitoring:  done sporadically       Glucometer: ? FreeStyle     Blood Glucose readings:  Fasting: 122 twice, after breakfast 101-247, after lunch 98-111 and after supper 156 Overall median 122, checking blood sugars mostly after breakfast and lunch Hypoglycemia: Never.           Self-care: The diet that the patient has been following is:  Variable  Meals: 3 meals per day.    Diet history:  Typical breakfast is an ham sandwich or cereal Physical activity: exercise: some walking        Dietician visit: Most recent: 5/14, does not want to pay for this again.      Retinal exam: Most recent: About 2 years    Urine microalbumin: Normal in 10/14  Weight history: Usually ranging from 290-305  Wt Readings from Last 3 Encounters:  04/02/14 297 lb 6.4 oz (134.9 kg)  12/27/13 304 lb 12.8 oz (138.256 kg)  09/25/13 297 lb 9.6 oz (134.99 kg)   Diabetes control: A1c on 05/16/13 was 7.5, previously was 7.2  Lab Results  Component Value Date   HGBA1C 6.9* 03/25/2014   HGBA1C 6.8* 12/24/2013   HGBA1C 6.4 09/19/2013   Lab Results  Component Value Date   MICROALBUR 0.7 12/24/2013   LDLCALC 122* 12/24/2013   CREATININE 1.1 03/25/2014    No visits with results within 1 Week(s) from this visit. Latest known visit with results is:  Appointment on 03/25/2014  Component Date Value Ref Range Status  . Hemoglobin A1C 03/25/2014 6.9* 4.6 - 6.5 % Final   Glycemic Control Guidelines for People with Diabetes:Non Diabetic:  <6%Goal of Therapy: <  7%Additional Action Suggested:  >8%   . Sodium 03/25/2014 140  135 - 145 mEq/L Final  . Potassium 03/25/2014 3.8  3.5 - 5.1 mEq/L Final  . Chloride 03/25/2014 106  96 - 112 mEq/L Final  . CO2 03/25/2014 27  19 - 32 mEq/L Final  . Glucose, Bld 03/25/2014 85  70 - 99 mg/dL Final  . BUN 03/25/2014 15  6 - 23 mg/dL Final  . Creatinine, Ser 03/25/2014 1.1  0.4 - 1.5 mg/dL Final  . Calcium 03/25/2014 9.2  8.4 - 10.5 mg/dL Final  . GFR 03/25/2014 70.68  >60.00 mL/min Final      Medication List       This list is accurate as of: 04/02/14  3:28 PM.  Always use your most recent med list.               aspirin 81 MG tablet  Take 1 tablet (81 mg total) by mouth daily. STOP TAKING AND RESTART Dec 29 2011     atorvastatin 40 MG tablet  Commonly known as:  LIPITOR  Take 40 mg by mouth daily.      Dulaglutide 1.5 MG/0.5ML Sopn  Commonly known as:  TRULICITY  Inject 1.5 mg into the skin once a week.     fenofibrate 145 MG tablet  Commonly known as:  TRICOR  Take 145 mg by mouth daily.     metFORMIN 750 MG 24 hr tablet  Commonly known as:  GLUCOPHAGE-XR  Take 2 tablets (1,500 mg total) by mouth daily with breakfast.     valsartan 160 MG tablet  Commonly known as:  DIOVAN  Take 160 mg by mouth daily.        Allergies: No Known Allergies  Past Medical History  Diagnosis Date  . Hypertension   . Hyperlipidemia   . Personal history of adenomatous colonic polyps 01/19/2011  . Ulcer     30 years ago  . Sleep apnea     not using CPAP  . Diabetes mellitus without complication     Past Surgical History  Procedure Laterality Date  . Colonoscopy w/ polypectomy  01/13/11    3 polyps, largest 3 cm cecal polyp - adenomas  . Colonoscopy  12/14/2011    Procedure: COLONOSCOPY;  Surgeon: Gatha Mayer, MD;  Location: [facility];  Service: Endoscopy;  Laterality: N/A;  colon wiht APC for follow up of colon polyps  . Hot hemostasis  12/14/2011    Procedure: HOT HEMOSTASIS (ARGON PLASMA COAGULATION/BICAP);  Surgeon: Gatha Mayer, MD;  Location: [facility];  Service: Endoscopy;  Laterality: N/A;  . Colonoscopy N/A 10/10/2012    Procedure: COLONOSCOPY;  Surgeon: Gatha Mayer, MD;  Location: [facility];  Service: Endoscopy;  Laterality: N/A;  Need APC    Family History  Problem Relation Age of Onset  . Colon cancer Mother 41  . Heart disease Mother   . Obesity Neg Hx   . Sleep apnea Neg Hx   . Stroke Neg Hx   . Heart attack Neg Hx   . Heart disease Father     Social History:  reports that he has never smoked. He has never used smokeless tobacco. He reports that he drinks alcohol. He reports that he does not use illicit drugs.    Review of Systems       Lipids: Has been on Lipitor for about 10 years for hypercholesterolemia, followed by PCP.          Lab Results  Component Value Date   CHOL 177 12/24/2013   HDL 28.00* 12/24/2013   LDLCALC 122* 12/24/2013   TRIG 137.0 12/24/2013   CHOLHDL 6 12/24/2013            Sleep apnea: He is using his CPAP and his fatigue is much better; now he thinks he can get by with less frequent use    Physical Examination:  BP 130/69  Pulse 64  Temp(Src) 98.7 F (37.1 C)  Resp 16  Ht 6' (1.829 m)  Wt 297 lb 6.4 oz (134.9 kg)  BMI 40.33 kg/m2  SpO2 96%   ASSESSMENT:  Diabetes type 2, uncontrolled   He has had type II diabetes with obesity As discussed in history of present illness his compliance has been variable with his diet and exercise  Although he is doing better now he is not taking his Trulicity regularly and A1c is relatively higher than when he first started this  Hyperlipidemia: On medication but has relatively higher LDL as of the last visit and he will followup with PCP next month   PLAN:   Improve diet and exercise regimen Given handout on basic diabetes meal plan Trulicity booklet given and he will use the enclosed stickers to remind him of his weekly dosage More blood sugars after supper  Valon Glasscock 04/02/2014, 3:28 PM

## 2014-04-23 ENCOUNTER — Other Ambulatory Visit: Payer: Self-pay | Admitting: *Deleted

## 2014-04-23 ENCOUNTER — Other Ambulatory Visit: Payer: Self-pay | Admitting: Endocrinology

## 2014-04-23 MED ORDER — METFORMIN HCL ER 750 MG PO TB24
1500.0000 mg | ORAL_TABLET | Freq: Every day | ORAL | Status: DC
Start: 2014-04-23 — End: 2019-05-07

## 2014-08-11 ENCOUNTER — Other Ambulatory Visit (INDEPENDENT_AMBULATORY_CARE_PROVIDER_SITE_OTHER): Payer: Self-pay

## 2014-08-11 DIAGNOSIS — IMO0002 Reserved for concepts with insufficient information to code with codable children: Secondary | ICD-10-CM

## 2014-08-11 DIAGNOSIS — E1165 Type 2 diabetes mellitus with hyperglycemia: Secondary | ICD-10-CM

## 2014-08-11 LAB — HEMOGLOBIN A1C: Hgb A1c MFr Bld: 6.6 % — ABNORMAL HIGH (ref 4.6–6.5)

## 2014-08-12 LAB — BASIC METABOLIC PANEL
BUN: 16 mg/dL (ref 6–23)
CO2: 24 mEq/L (ref 19–32)
CREATININE: 1.1 mg/dL (ref 0.4–1.5)
Calcium: 9.4 mg/dL (ref 8.4–10.5)
Chloride: 107 mEq/L (ref 96–112)
GFR: 76.83 mL/min (ref >60.00)
Glucose, Bld: 131 mg/dL — ABNORMAL HIGH (ref 70–99)
Potassium: 4 mEq/L (ref 3.5–5.1)
SODIUM: 141 meq/L (ref 135–145)

## 2014-08-12 LAB — MICROALBUMIN / CREATININE URINE RATIO
CREATININE, U: 98.1 mg/dL
MICROALB UR: 1.8 mg/dL (ref 0.0–1.9)
MICROALB/CREAT RATIO: 1.8 mg/g (ref 0.0–30.0)

## 2014-08-14 ENCOUNTER — Ambulatory Visit: Payer: BC Managed Care – PPO | Admitting: Endocrinology

## 2014-08-29 ENCOUNTER — Ambulatory Visit: Payer: Self-pay | Admitting: Endocrinology

## 2014-09-24 ENCOUNTER — Other Ambulatory Visit: Payer: Self-pay | Admitting: Endocrinology

## 2014-10-04 ENCOUNTER — Other Ambulatory Visit: Payer: Self-pay | Admitting: Endocrinology

## 2014-10-20 ENCOUNTER — Ambulatory Visit (INDEPENDENT_AMBULATORY_CARE_PROVIDER_SITE_OTHER): Payer: BC Managed Care – PPO | Admitting: Endocrinology

## 2014-10-20 ENCOUNTER — Encounter: Payer: Self-pay | Admitting: Endocrinology

## 2014-10-20 VITALS — BP 142/74 | HR 72 | Temp 97.8°F | Resp 16 | Ht 72.0 in | Wt 304.0 lb

## 2014-10-20 DIAGNOSIS — E119 Type 2 diabetes mellitus without complications: Secondary | ICD-10-CM

## 2014-10-20 DIAGNOSIS — I10 Essential (primary) hypertension: Secondary | ICD-10-CM

## 2014-10-20 MED ORDER — DULAGLUTIDE 1.5 MG/0.5ML ~~LOC~~ SOAJ: SUBCUTANEOUS | Status: DC

## 2014-10-20 NOTE — Patient Instructions (Signed)
Please check blood sugars at least half the time about 2 hours after any meal and 3 times per week on waking up. Please bring blood sugar monitor to each visit. Recommended blood sugar levels about 2 hours after meal is 140-180 and on waking up 90-130  Walking 20 min briskly

## 2014-10-20 NOTE — Progress Notes (Signed)
Patient ID: Mark White, male   DOB: 10-Feb-1956, 59 y.o.   MRN: [id number]   Reason for Appointment : Followup for Type 2 Diabetes  History of Present Illness          Diagnosis: Type 2 diabetes mellitus, date of diagnosis:   2013        Past history:  At diagnosis he was having borderline hyperglycemia. He had previously been taking metformin from his PCP. He was prescribed this twice a day but would tend to forget and take it once a day.  His A1c in early 2014 was 7.2   Because of his relatively high A1c of 7.5 and obesity he was started on Trulicity 7.82 mg weekly in 06/2013  Recent history:  He has not been seen in follow-up since 9/15 Previously was having difficulty remembering to take his Trulicity every week More recently has been taking his Trulicity fairly regularly but ran out 2 weeks ago Although his A1c is better at 6.6 his weight has gone up again Problems identified:  he thinks that he is generally not been able to control his portions when he does not take the Trulicity  Compliance with exercise and has also been poor  Also checking blood sugars very sporadically; recent fasting readings are relatively high and he thinks that his highest but sugar has been about 200 after meals  Previously fasting blood sugars when he was compliant was close to 100  Oral hypoglycemic drugs the patient is taking are: Metformin ER 750 mg, 2 tablets daily Side effects from medications have been: None      Compliance with the medical regimen:  Fair   Glucose monitoring:  done sporadically       Glucometer: ? FreeStyle     Blood Glucose readings:   Fasting: Recent range 120-146, nonfasting 103, 120  Hypoglycemia: Never.           Self-care: The diet that the patient has been following is: Variable  Meals: 3 meals per day.    Diet history:  Typical breakfast is an ham sandwich or cereal Physical activity: exercise: none      Dietician visit: Most recent: 5/14, does  not want to pay for this again.      Retinal exam: Most recent: About 2 years    Urine microalbumin: Normal in 10/14  Weight history: Usually ranging from 290-305  Wt Readings from Last 3 Encounters:  10/20/14 304 lb (137.893 kg)  04/02/14 297 lb 6.4 oz (134.9 kg)  12/27/13 304 lb 12.8 oz (138.256 kg)   Diabetes control: A1c on 05/16/13 was 7.5, previously was 7.2  Lab Results  Component Value Date   HGBA1C 6.6* 08/11/2014   HGBA1C 6.9* 03/25/2014   HGBA1C 6.8* 12/24/2013   Lab Results  Component Value Date   MICROALBUR 1.8 08/11/2014   LDLCALC 122* 12/24/2013   CREATININE 1.1 08/11/2014    No visits with results within 1 Week(s) from this visit. Latest known visit with results is:  Appointment on 08/11/2014  Component Date Value Ref Range Status  . Hgb A1c MFr Bld 08/11/2014 6.6* 4.6 - 6.5 % Final   Glycemic Control Guidelines for People with Diabetes:Non Diabetic:  <6%Goal of Therapy: <7%Additional Action Suggested:  >8%   . Sodium 08/11/2014 141  135 - 145 mEq/L Final  . Potassium 08/11/2014 4.0  3.5 - 5.1 mEq/L Final  . Chloride 08/11/2014 107  96 - 112 mEq/L Final  . CO2 08/11/2014 24  19 - 32 mEq/L Final  . Glucose, Bld 08/11/2014 131* 70 - 99 mg/dL Final  . BUN 08/11/2014 16  6 - 23 mg/dL Final  . Creatinine, Ser 08/11/2014 1.1  0.4 - 1.5 mg/dL Final  . Calcium 08/11/2014 9.4  8.4 - 10.5 mg/dL Final  . GFR 08/11/2014 76.83  >60.00 mL/min Final  . Microalb, Ur 08/11/2014 1.8  0.0 - 1.9 mg/dL Final  . Creatinine,U 08/11/2014 98.1   Final  . Microalb Creat Ratio 08/11/2014 1.8  0.0 - 30.0 mg/g Final      Medication List       This list is accurate as of: 10/20/14  2:59 PM.  Always use your most recent med list.               aspirin 81 MG tablet  Take 1 tablet (81 mg total) by mouth daily. STOP TAKING AND RESTART Dec 29 2011     atorvastatin 40 MG tablet  Commonly known as:  LIPITOR  Take 40 mg by mouth daily.     azithromycin 250 MG tablet    Commonly known as:  ZITHROMAX  Take 250 mg by mouth daily.     fenofibrate 145 MG tablet  Commonly known as:  TRICOR  Take 145 mg by mouth daily.     metFORMIN 750 MG 24 hr tablet  Commonly known as:  GLUCOPHAGE-XR  Take 2 tablets (1,500 mg total) by mouth daily with breakfast.     TRULICITY 1.5 ZJ/6.7HA Sopn  Generic drug:  Dulaglutide  INJECT 1.5 MG INTO THE SKIN ONCE A WEEK.     valsartan 160 MG tablet  Commonly known as:  DIOVAN  Take 160 mg by mouth daily.        Allergies: No Known Allergies  Past Medical History  Diagnosis Date  . Hypertension   . Hyperlipidemia   . Personal history of adenomatous colonic polyps 01/19/2011  . Ulcer     30 years ago  . Sleep apnea     not using CPAP  . Diabetes mellitus without complication     Past Surgical History  Procedure Laterality Date  . Colonoscopy w/ polypectomy  01/13/11    3 polyps, largest 3 cm cecal polyp - adenomas  . Colonoscopy  12/14/2011    Procedure: COLONOSCOPY;  Surgeon: Gatha Mayer, MD;  Location: [facility];  Service: Endoscopy;  Laterality: N/A;  colon wiht APC for follow up of colon polyps  . Hot hemostasis  12/14/2011    Procedure: HOT HEMOSTASIS (ARGON PLASMA COAGULATION/BICAP);  Surgeon: Gatha Mayer, MD;  Location: [facility];  Service: Endoscopy;  Laterality: N/A;  . Colonoscopy N/A 10/10/2012    Procedure: COLONOSCOPY;  Surgeon: Gatha Mayer, MD;  Location: [facility];  Service: Endoscopy;  Laterality: N/A;  Need APC    Family History  Problem Relation Age of Onset  . Colon cancer Mother 36  . Heart disease Mother   . Obesity Neg Hx   . Sleep apnea Neg Hx   . Stroke Neg Hx   . Heart attack Neg Hx   . Heart disease Father     Social History:  reports that he has never smoked. He has never used smokeless tobacco. He reports that he drinks alcohol. He reports that he does not use illicit drugs.    Review of Systems       Lipids: Has been on Lipitor for about 10 years for  hypercholesterolemia, followed by PCP. He thinks  he has had labs done in November for his work physical but report not available.          Lab Results  Component Value Date   CHOL 177 12/24/2013   HDL 28.00* 12/24/2013   LDLCALC 122* 12/24/2013   TRIG 137.0 12/24/2013   CHOLHDL 6 12/24/2013            Sleep apnea: He is currently not using his CPAP as he thinks he does not have any symptoms  Last foot exam in 3/16  Physical Examination:  BP 142/74 mmHg  Pulse 72  Temp(Src) 97.8 F (36.6 C)  Resp 16  Ht 6' (1.829 m)  Wt 304 lb (137.893 kg)  BMI 41.22 kg/m2  SpO2 98%  Diabetic foot exam shows normal monofilament sensation in the toes and plantar surfaces, no skin lesions or ulcers on the feet and normal pedal pulses   ASSESSMENT:  Diabetes type 2, uncontrolled   He has had type II diabetes with obesity and variable control As discussed in history of present illness his compliance has been variable with his diet Part of his difficulty with compliance with diet is explained by his noncompliance with Trulicity recently which appears to help to some degree Has not been motivated to exercise Also checking blood sugars very sporadically Surprisingly his A1c is relatively good at 6.6  Hyperlipidemia:  He will send [place] copies of his labs done at his physical   PLAN:   Improve diet and exercise regimen Take Trulicity consistently every Sunday More consistent glucose monitoring including postprandial  Follow-up in 3 months  Kiersten Coss 10/20/2014, 2:59 PM

## 2014-11-06 ENCOUNTER — Encounter: Payer: Self-pay | Admitting: Endocrinology

## 2015-01-14 ENCOUNTER — Other Ambulatory Visit: Payer: BC Managed Care – PPO

## 2015-01-19 ENCOUNTER — Ambulatory Visit: Payer: BC Managed Care – PPO | Admitting: Endocrinology

## 2015-01-29 ENCOUNTER — Other Ambulatory Visit (INDEPENDENT_AMBULATORY_CARE_PROVIDER_SITE_OTHER): Payer: Self-pay

## 2015-01-29 DIAGNOSIS — E119 Type 2 diabetes mellitus without complications: Secondary | ICD-10-CM

## 2015-01-29 LAB — COMPREHENSIVE METABOLIC PANEL
ALBUMIN: 4.3 g/dL (ref 3.5–5.2)
ALT: 28 U/L (ref 0–53)
AST: 20 U/L (ref 0–37)
Alkaline Phosphatase: 87 U/L (ref 39–117)
BUN: 19 mg/dL (ref 6–23)
CALCIUM: 9.6 mg/dL (ref 8.4–10.5)
CO2: 28 mEq/L (ref 19–32)
Chloride: 106 mEq/L (ref 96–112)
Creatinine, Ser: 1.07 mg/dL (ref 0.40–1.50)
GFR: 75.05 mL/min (ref >60.00)
Glucose, Bld: 91 mg/dL (ref 70–99)
Potassium: 3.8 mEq/L (ref 3.5–5.1)
Sodium: 141 mEq/L (ref 135–145)
TOTAL PROTEIN: 7.3 g/dL (ref 6.0–8.3)
Total Bilirubin: 0.5 mg/dL (ref 0.2–1.2)

## 2015-01-29 LAB — URINALYSIS, ROUTINE W REFLEX MICROSCOPIC
BILIRUBIN URINE: NEGATIVE
HGB URINE DIPSTICK: NEGATIVE
KETONES UR: NEGATIVE
LEUKOCYTES UA: NEGATIVE
NITRITE: NEGATIVE
PH: 6 (ref 5.0–8.0)
RBC / HPF: NONE SEEN (ref 0–?)
Specific Gravity, Urine: <=1.005 — AB (ref 1.000–1.030)
TOTAL PROTEIN, URINE-UPE24: NEGATIVE
URINE GLUCOSE: NEGATIVE
Urobilinogen, UA: 0.2 (ref 0.0–1.0)
WBC, UA: NONE SEEN (ref 0–?)

## 2015-01-29 LAB — MICROALBUMIN / CREATININE URINE RATIO
Creatinine,U: 64.3 mg/dL
Microalb Creat Ratio: 1.1 mg/g (ref 0.0–30.0)
Microalb, Ur: <0.7 mg/dL (ref 0.0–1.9)

## 2015-01-29 LAB — LIPID PANEL
Cholesterol: 144 mg/dL (ref 0–200)
HDL: 28 mg/dL — ABNORMAL LOW (ref >39.00)
LDL Cholesterol: 94 mg/dL (ref 0–99)
NONHDL: 116
Total CHOL/HDL Ratio: 5
Triglycerides: 110 mg/dL (ref 0.0–149.0)
VLDL: 22 mg/dL (ref 0.0–40.0)

## 2015-01-29 LAB — HEMOGLOBIN A1C: Hgb A1c MFr Bld: 5.8 % (ref 4.6–6.5)

## 2015-01-29 LAB — HM DIABETES EYE EXAM

## 2015-02-05 ENCOUNTER — Encounter: Payer: Self-pay | Admitting: Endocrinology

## 2015-02-05 ENCOUNTER — Ambulatory Visit (INDEPENDENT_AMBULATORY_CARE_PROVIDER_SITE_OTHER): Payer: BLUE CROSS/BLUE SHIELD | Admitting: Endocrinology

## 2015-02-05 VITALS — BP 134/78 | HR 83 | Temp 98.2°F | Resp 16 | Ht 72.0 in | Wt 283.6 lb

## 2015-02-05 DIAGNOSIS — I10 Essential (primary) hypertension: Secondary | ICD-10-CM

## 2015-02-05 DIAGNOSIS — E119 Type 2 diabetes mellitus without complications: Secondary | ICD-10-CM

## 2015-02-05 NOTE — Patient Instructions (Signed)
Walk daily  Check blood sugars on waking up ..2  .. times a week Also check blood sugars about 2 hours after a meal and do this after different meals by rotation  Recommended blood sugar levels on waking up is 90-130 and about 2 hours after meal is 140-180 Please bring blood sugar monitor to each visit.

## 2015-02-05 NOTE — Progress Notes (Signed)
Patient ID: Mark White, male   DOB: 08-Dec-1955, 59 y.o.   MRN: [id number]   Reason for Appointment : Followup for Type 2 Diabetes  History of Present Illness          Diagnosis: Type 2 diabetes mellitus, date of diagnosis:   2013        Past history:  At diagnosis he was having borderline hyperglycemia. He had previously been taking metformin from his PCP. He was prescribed this twice a day but would tend to forget and take it once a day.  His A1c in early 2014 was 7.2   Because of his relatively high A1c of 7.5 and obesity he was started on Trulicity 3.29 mg weekly in 06/2013  Recent history:  Since his last visit in 09/2014 he has made substantial changes in his diet and has lost over 20 pounds His A1c is also much better than usual at 5.8 He continues to take Trulicity 1.5 mg weekly and has no side effects. Also has been more compliant with this injection; generally not been able to control his portions when he does not take the Trulicity Previously was having difficulty remembering to take his Trulicity every week He has not checked blood sugars at home very much but these are mostly near normal, highest 164 after evening meal but otherwise averaging 121, has 8 readings in the last month  Oral hypoglycemic drugs the patient is taking are: Metformin ER 750 mg, 2 tablets daily Side effects from medications have been: None      Compliance with the medical regimen:  Fair   Glucose monitoring:  done sporadically       Glucometer: ? FreeStyle     Blood Glucose readings:  As above  Hypoglycemia: Never.           Self-care: The diet that the patient has been following is: Low carbohydrate.  He has significantly reduced the amount of carbohydrate at all meals  Diet history:  Typical breakfast is an egg, sausage without any bread now  Physical activity: exercise: some walking      Dietician visit: Most recent: 5/14, does not want to pay for this again.      Weight history:  Usually ranging from 290-305  Wt Readings from Last 3 Encounters:  02/05/15 283 lb 9.6 oz (128.64 kg)  10/20/14 304 lb (137.893 kg)  04/02/14 297 lb 6.4 oz (134.9 kg)   Diabetes control: A1c on 05/16/13 was 7.5, previously was 7.2  Lab Results  Component Value Date   HGBA1C 5.8 01/29/2015   HGBA1C 6.6* 08/11/2014   HGBA1C 6.9* 03/25/2014   Lab Results  Component Value Date   MICROALBUR <0.7 01/29/2015   LDLCALC 94 01/29/2015   CREATININE 1.07 01/29/2015    No visits with results within 1 Week(s) from this visit. Latest known visit with results is:  Lab on 01/29/2015  Component Date Value Ref Range Status  . Sodium 01/29/2015 141  135 - 145 mEq/L Final  . Potassium 01/29/2015 3.8  3.5 - 5.1 mEq/L Final  . Chloride 01/29/2015 106  96 - 112 mEq/L Final  . CO2 01/29/2015 28  19 - 32 mEq/L Final  . Glucose, Bld 01/29/2015 91  70 - 99 mg/dL Final  . BUN 01/29/2015 19  6 - 23 mg/dL Final  . Creatinine, Ser 01/29/2015 1.07  0.40 - 1.50 mg/dL Final  . Total Bilirubin 01/29/2015 0.5  0.2 - 1.2 mg/dL Final  . Alkaline Phosphatase  01/29/2015 87  39 - 117 U/L Final  . AST 01/29/2015 20  0 - 37 U/L Final  . ALT 01/29/2015 28  0 - 53 U/L Final  . Total Protein 01/29/2015 7.3  6.0 - 8.3 g/dL Final  . Albumin 01/29/2015 4.3  3.5 - 5.2 g/dL Final  . Calcium 01/29/2015 9.6  8.4 - 10.5 mg/dL Final  . GFR 01/29/2015 75.05  >60.00 mL/min Final  . Microalb, Ur 01/29/2015 <0.7  0.0 - 1.9 mg/dL Final  . Creatinine,U 01/29/2015 64.3   Final  . Microalb Creat Ratio 01/29/2015 1.1  0.0 - 30.0 mg/g Final  . Color, Urine 01/29/2015 YELLOW  Yellow;Lt. Yellow Final  . APPearance 01/29/2015 CLEAR  Clear Final  . Specific Gravity, Urine 01/29/2015 <=1.005* 1.000 - 1.030 Final  . pH 01/29/2015 6.0  5.0 - 8.0 Final  . Total Protein, Urine 01/29/2015 NEGATIVE  Negative Final  . Urine Glucose 01/29/2015 NEGATIVE  Negative Final  . Ketones, ur 01/29/2015 NEGATIVE  Negative Final  . Bilirubin Urine  01/29/2015 NEGATIVE  Negative Final  . Hgb urine dipstick 01/29/2015 NEGATIVE  Negative Final  . Urobilinogen, UA 01/29/2015 0.2  0.0 - 1.0 Final  . Leukocytes, UA 01/29/2015 NEGATIVE  Negative Final  . Nitrite 01/29/2015 NEGATIVE  Negative Final  . WBC, UA 01/29/2015 none seen  0-2/hpf Final  . RBC / HPF 01/29/2015 none seen  0-2/hpf Final  . Squamous Epithelial / LPF 01/29/2015 Rare(0-4/hpf)  Rare(0-4/hpf) Final  . Cholesterol 01/29/2015 144  0 - 200 mg/dL Final   ATP III Classification       Desirable:  < 200 mg/dL               Borderline High:  200 - 239 mg/dL          High:  > = 240 mg/dL  . Triglycerides 01/29/2015 110.0  0.0 - 149.0 mg/dL Final   Normal:  <150 mg/dLBorderline High:  150 - 199 mg/dL  . HDL 01/29/2015 28.00* >39.00 mg/dL Final  . VLDL 01/29/2015 22.0  0.0 - 40.0 mg/dL Final  . LDL Cholesterol 01/29/2015 94  0 - 99 mg/dL Final  . Total CHOL/HDL Ratio 01/29/2015 5   Final                  Men          Women1/2 Average Risk     3.4          3.3Average Risk          5.0          4.42X Average Risk          9.6          7.13X Average Risk          15.0          11.0                      . NonHDL 01/29/2015 116.00   Final   NOTE:  Non-HDL goal should be 30 mg/dL higher than patient's LDL goal (i.e. LDL goal of < 70 mg/dL, would have non-HDL goal of < 100 mg/dL)  . Hgb A1c MFr Bld 01/29/2015 5.8  4.6 - 6.5 % Final   Glycemic Control Guidelines for People with Diabetes:Non Diabetic:  <6%Goal of Therapy: <7%Additional Action Suggested:  >8%       Medication List       This list is accurate as  of: 02/05/15  9:21 PM.  Always use your most recent med list.               aspirin 81 MG tablet  Take 1 tablet (81 mg total) by mouth daily. STOP TAKING AND RESTART Dec 29 2011     atorvastatin 40 MG tablet  Commonly known as:  LIPITOR  Take 40 mg by mouth daily.     Dulaglutide 1.5 MG/0.5ML Sopn  Commonly known as:  TRULICITY  INJECT 1.5 MG INTO THE SKIN ONCE A WEEK.      fenofibrate 145 MG tablet  Commonly known as:  TRICOR  Take 145 mg by mouth daily.     metFORMIN 750 MG 24 hr tablet  Commonly known as:  GLUCOPHAGE-XR  Take 2 tablets (1,500 mg total) by mouth daily with breakfast.     valsartan 160 MG tablet  Commonly known as:  DIOVAN  Take 160 mg by mouth daily.        Allergies: No Known Allergies  Past Medical History  Diagnosis Date  . Hypertension   . Hyperlipidemia   . Personal history of adenomatous colonic polyps 01/19/2011  . Ulcer     30 years ago  . Sleep apnea     not using CPAP  . Diabetes mellitus without complication     Past Surgical History  Procedure Laterality Date  . Colonoscopy w/ polypectomy  01/13/11    3 polyps, largest 3 cm cecal polyp - adenomas  . Colonoscopy  12/14/2011    Procedure: COLONOSCOPY;  Surgeon: Gatha Mayer, MD;  Location: [facility];  Service: Endoscopy;  Laterality: N/A;  colon wiht APC for follow up of colon polyps  . Hot hemostasis  12/14/2011    Procedure: HOT HEMOSTASIS (ARGON PLASMA COAGULATION/BICAP);  Surgeon: Gatha Mayer, MD;  Location: [facility];  Service: Endoscopy;  Laterality: N/A;  . Colonoscopy N/A 10/10/2012    Procedure: COLONOSCOPY;  Surgeon: Gatha Mayer, MD;  Location: [facility];  Service: Endoscopy;  Laterality: N/A;  Need APC    Family History  Problem Relation Age of Onset  . Colon cancer Mother 9  . Heart disease Mother   . Obesity Neg Hx   . Sleep apnea Neg Hx   . Stroke Neg Hx   . Heart attack Neg Hx   . Heart disease Father     Social History:  reports that he has never smoked. He has never used smokeless tobacco. He reports that he drinks alcohol. He reports that he does not use illicit drugs.    Review of Systems       Lipids: Has been on Lipitor for about 10 years for hypercholesterolemia, followed by PCP. Ileal is controlled but his HDL is still significantly low  Lab Results  Component Value Date   CHOL 144 01/29/2015   HDL 28.00*  01/29/2015   LDLCALC 94 01/29/2015   TRIG 110.0 01/29/2015   CHOLHDL 5 01/29/2015            Sleep apnea: He is currently not using his CPAP; now he does not have any symptomsAfter he has lost weight Because he was starting to get daytime somnolence he got more motivated to lose weight and is doing better  Last foot exam in 3/16  Physical Examination:  BP 134/78 mmHg  Pulse 83  Temp(Src) 98.2 F (36.8 C)  Resp 16  Ht 6' (1.829 m)  Wt 283 lb 9.6 oz (128.64 kg)  BMI 38.45  kg/m2  SpO2 94%    ASSESSMENT:  Diabetes type 2, uncontrolled   He has had type II diabetes with obesity and  previously variable control As discussed in history of present illness his compliance has been much better with his diet since he has been motivated to watch portions and carbohydrates; he started doing this when he was getting symptomatic with his sleep apnea  Recently A1c is in the normal range and most of his blood sugars are fairly good although checking sporadically He can however do better with his exercise which he is not doing much  Hyperlipidemia:  He  has persistently low HDL but normal triglycerides Consider reducing dose of Lipitor if his LDL is improved further  PLAN:   Improve exercise regimen Take Trulicity 1.5 mg daily weekly since this will help his compliance with portions May consider reducing the dose on the next visit  Follow-up in  4  months  Calel Pisarski 02/05/2015, 9:21 PM

## 2015-02-09 ENCOUNTER — Encounter: Payer: Self-pay | Admitting: *Deleted

## 2015-06-03 ENCOUNTER — Other Ambulatory Visit (INDEPENDENT_AMBULATORY_CARE_PROVIDER_SITE_OTHER): Payer: BLUE CROSS/BLUE SHIELD

## 2015-06-03 DIAGNOSIS — E119 Type 2 diabetes mellitus without complications: Secondary | ICD-10-CM | POA: Diagnosis not present

## 2015-06-03 LAB — BASIC METABOLIC PANEL
BUN: 19 mg/dL (ref 6–23)
CHLORIDE: 106 meq/L (ref 96–112)
CO2: 29 mEq/L (ref 19–32)
Calcium: 9.6 mg/dL (ref 8.4–10.5)
Creatinine, Ser: 1 mg/dL (ref 0.40–1.50)
GFR: 81.05 mL/min (ref >60.00)
GLUCOSE: 116 mg/dL — AB (ref 70–99)
Potassium: 3.9 mEq/L (ref 3.5–5.1)
SODIUM: 141 meq/L (ref 135–145)

## 2015-06-03 LAB — HEMOGLOBIN A1C: HEMOGLOBIN A1C: 6.5 % (ref 4.6–6.5)

## 2015-06-08 ENCOUNTER — Encounter: Payer: Self-pay | Admitting: Endocrinology

## 2015-06-08 ENCOUNTER — Ambulatory Visit (INDEPENDENT_AMBULATORY_CARE_PROVIDER_SITE_OTHER): Payer: BLUE CROSS/BLUE SHIELD | Admitting: Endocrinology

## 2015-06-08 VITALS — BP 118/68 | HR 70 | Temp 98.6°F | Resp 16 | Ht 72.0 in | Wt 287.8 lb

## 2015-06-08 DIAGNOSIS — E119 Type 2 diabetes mellitus without complications: Secondary | ICD-10-CM

## 2015-06-08 DIAGNOSIS — G4733 Obstructive sleep apnea (adult) (pediatric): Secondary | ICD-10-CM | POA: Diagnosis not present

## 2015-06-08 NOTE — Patient Instructions (Addendum)
Exercise 4-5 days a week  Use CPAp as much as possible  Check blood sugars on waking up 1-2  times a week Also check blood sugars about 2 hours after a meal and do this after different meals by rotation  Recommended blood sugar levels on waking up is 90-130 and about 2 hours after meal is 130-160  Please bring your blood sugar monitor to each visit, thank you

## 2015-06-08 NOTE — Progress Notes (Signed)
Patient ID: Mark White, male   DOB: [birth year], 59 y.o.   MRN: [id number]   Reason for Appointment : Followup for Type 2 Diabetes  History of Present Illness          Diagnosis: Type 2 diabetes mellitus, date of diagnosis:   2013        Past history:  At diagnosis he was having borderline hyperglycemia. He had previously been taking metformin from his PCP. He was prescribed this twice a day but would tend to forget and take it once a day.  His A1c in early 2014 was 7.2   Because of his relatively high A1c of 7.5 and obesity he was started on Trulicity 5.46 mg weekly in 06/2013  Recent history:   Previously in 09/2014 he had made substantial changes in his diet and lost over 20 pounds His A1c was also much better than usual at 5.8 However his weight has started going up and he has relatively higher A1c of 6.5 now  Current blood sugar patterns and problems identified:  He did  bring his monitor for download today but is checking blood sugars only in the last 4 days and only once after evening meal  His fasting blood sugars are near normal and blood sugars are fairly good at lunchtime and suppertime also,: Median reading for the last few days is 105  Highest reading was 169 at bedtime after eating a large [place] food  He stopped taking his Trulicity in 2/70 as he wanted to try controlling his sugar on his own and did not like to take additional medication.  However he thinks that it does help with portion control  He previously had done well with reducing carbohydrate intake but this is not consistent now  Oral hypoglycemic drugs the patient is taking are: Metformin ER 750 mg, 2 tablets daily Side effects from medications have been: None      Compliance with the medical regimen:  Fair   Glucose monitoring:  done sporadically       Glucometer:    One Touch   Blood Glucose readings:  As above  Hypoglycemia: Never.           Self-care: The diet that the patient has  been following is: Low carbohydrate but not consistently  Diet history:  Typical breakfast is an egg, sausage without any bread now  Physical activity: exercise: less walking  recently      Dietician visit: Most recent: 5/14, does not want to pay for this again.      Weight history: Usually ranging from 290-305  Wt Readings from Last 3 Encounters:  06/08/15 287 lb 12.8 oz (130.545 kg)  02/05/15 283 lb 9.6 oz (128.64 kg)  10/20/14 304 lb (137.893 kg)   Diabetes control: A1c on 05/16/13 was 7.5  Lab Results  Component Value Date   HGBA1C 6.5 06/03/2015   HGBA1C 5.8 01/29/2015   HGBA1C 6.6* 08/11/2014   Lab Results  Component Value Date   MICROALBUR <0.7 01/29/2015   LDLCALC 94 01/29/2015   CREATININE 1.00 06/03/2015    Appointment on 06/03/2015  Component Date Value Ref Range Status  . Hgb A1c MFr Bld 06/03/2015 6.5  4.6 - 6.5 % Final   Glycemic Control Guidelines for People with Diabetes:Non Diabetic:  <6%Goal of Therapy: <7%Additional Action Suggested:  >8%   . Sodium 06/03/2015 141  135 - 145 mEq/L Final  . Potassium 06/03/2015 3.9  3.5 - 5.1 mEq/L Final  .  Chloride 06/03/2015 106  96 - 112 mEq/L Final  . CO2 06/03/2015 29  19 - 32 mEq/L Final  . Glucose, Bld 06/03/2015 116* 70 - 99 mg/dL Final  . BUN 06/03/2015 19  6 - 23 mg/dL Final  . Creatinine, Ser 06/03/2015 1.00  0.40 - 1.50 mg/dL Final  . Calcium 06/03/2015 9.6  8.4 - 10.5 mg/dL Final  . GFR 06/03/2015 81.05  >60.00 mL/min Final      Medication List       This list is accurate as of: 06/08/15 11:59 PM.  Always use your most recent med list.               aspirin 81 MG tablet  Take 1 tablet (81 mg total) by mouth daily. STOP TAKING AND RESTART Dec 29 2011     atorvastatin 40 MG tablet  Commonly known as:  LIPITOR  Take 40 mg by mouth daily.     fenofibrate 145 MG tablet  Commonly known as:  TRICOR  Take 145 mg by mouth daily.     metFORMIN 750 MG 24 hr tablet  Commonly known as:  GLUCOPHAGE-XR    Take 2 tablets (1,500 mg total) by mouth daily with breakfast.     valsartan 160 MG tablet  Commonly known as:  DIOVAN  Take 160 mg by mouth daily.        Allergies: No Known Allergies  Past Medical History  Diagnosis Date  . Hypertension   . Hyperlipidemia   . Personal history of adenomatous colonic polyps 01/19/2011  . Ulcer     30 years ago  . Sleep apnea     not using CPAP  . Diabetes mellitus without complication [facility])     Past Surgical History  Procedure Laterality Date  . Colonoscopy w/ polypectomy  01/13/11    3 polyps, largest 3 cm cecal polyp - adenomas  . Colonoscopy  12/14/2011    Procedure: COLONOSCOPY;  Surgeon: Gatha Mayer, MD;  Location: [facility];  Service: Endoscopy;  Laterality: N/A;  colon wiht APC for follow up of colon polyps  . Hot hemostasis  12/14/2011    Procedure: HOT HEMOSTASIS (ARGON PLASMA COAGULATION/BICAP);  Surgeon: Gatha Mayer, MD;  Location: [facility];  Service: Endoscopy;  Laterality: N/A;  . Colonoscopy N/A 10/10/2012    Procedure: COLONOSCOPY;  Surgeon: Gatha Mayer, MD;  Location: [facility];  Service: Endoscopy;  Laterality: N/A;  Need APC    Family History  Problem Relation Age of Onset  . Colon cancer Mother 40  . Heart disease Mother   . Obesity Neg Hx   . Sleep apnea Neg Hx   . Stroke Neg Hx   . Heart attack Neg Hx   . Heart disease Father     Social History:  reports that he has never smoked. He has never used smokeless tobacco. He reports that he drinks alcohol. He reports that he does not use illicit drugs.    Review of Systems       Lipids: Has been on Lipitor for about 10 years for hypercholesterolemia, followed by PCP. LDL is controlled but his HDL is significantly low  Lab Results  Component Value Date   CHOL 144 01/29/2015   HDL 28.00* 01/29/2015   LDLCALC 94 01/29/2015   TRIG 110.0 01/29/2015   CHOLHDL 5 01/29/2015            Sleep apnea: He is currently not using his CPAP; he does tend to  get tired at times and sleepy  Last foot exam in 3/16  Physical Examination:  BP 118/68 mmHg  Pulse 70  Temp(Src) 98.6 F (37 C) (Oral)  Resp 16  Ht 6' (1.829 m)  Wt 287 lb 12.8 oz (130.545 kg)  BMI 39.02 kg/m2  SpO2 97%    ASSESSMENT:  Diabetes type 2, uncontrolled   He has had type II diabetes with obesity and  previously variable control As discussed in history of present illness his compliance has been variable and because of his stopping Trulicity he is starting to gain weight and A1c is trending higher. Recently his home blood sugars are looking fairly good Discussed need for more consistent diet and exercise regimen He again does not want to try Trulicity again until the next visit  Hyperlipidemia:  He  has persistently low HDL but normal triglycerides  SLEEP apnea: He appears to be somewhat symptomatic and does not understand the need for consistent treatment; is not compliant because of difficulty wearing the mask   PLAN:   Improve exercise regimen More consistent diet Call if he wants to try Trulicity again He will try to use the CPAP at least part of the night to help his energy level  Follow-up in  4  Months  Patient Instructions  Exercise 4-5 days a week  Use CPAp as much as possible  Check blood sugars on waking up 1-2  times a week Also check blood sugars about 2 hours after a meal and do this after different meals by rotation  Recommended blood sugar levels on waking up is 90-130 and about 2 hours after meal is 130-160  Please bring your blood sugar monitor to each visit, thank you      [facility] 06/09/2015, 12:48 PM

## 2015-10-02 ENCOUNTER — Other Ambulatory Visit (INDEPENDENT_AMBULATORY_CARE_PROVIDER_SITE_OTHER): Payer: BLUE CROSS/BLUE SHIELD

## 2015-10-02 DIAGNOSIS — E119 Type 2 diabetes mellitus without complications: Secondary | ICD-10-CM

## 2015-10-02 DIAGNOSIS — G4733 Obstructive sleep apnea (adult) (pediatric): Secondary | ICD-10-CM | POA: Diagnosis not present

## 2015-10-02 LAB — BASIC METABOLIC PANEL
BUN: 13 mg/dL (ref 6–23)
CALCIUM: 9.5 mg/dL (ref 8.4–10.5)
CHLORIDE: 108 meq/L (ref 96–112)
CO2: 26 meq/L (ref 19–32)
CREATININE: 1.11 mg/dL (ref 0.40–1.50)
GFR: 71.78 mL/min (ref >60.00)
Glucose, Bld: 104 mg/dL — ABNORMAL HIGH (ref 70–99)
Potassium: 3.6 mEq/L (ref 3.5–5.1)
Sodium: 142 mEq/L (ref 135–145)

## 2015-10-02 LAB — TSH: TSH: 2.01 u[IU]/mL (ref 0.35–4.50)

## 2015-10-02 LAB — HEMOGLOBIN A1C: HEMOGLOBIN A1C: 6.7 % — AB (ref 4.6–6.5)

## 2015-10-07 ENCOUNTER — Other Ambulatory Visit: Payer: Self-pay | Admitting: *Deleted

## 2015-10-07 ENCOUNTER — Ambulatory Visit (INDEPENDENT_AMBULATORY_CARE_PROVIDER_SITE_OTHER): Payer: BLUE CROSS/BLUE SHIELD | Admitting: Endocrinology

## 2015-10-07 VITALS — BP 122/65 | Ht 72.0 in | Wt 291.4 lb

## 2015-10-07 DIAGNOSIS — E1165 Type 2 diabetes mellitus with hyperglycemia: Secondary | ICD-10-CM

## 2015-10-07 MED ORDER — ONETOUCH ULTRA BLUE VI STRP: ORAL_STRIP | Status: DC

## 2015-10-07 MED ORDER — ONETOUCH DELICA LANCETS 33G MISC: Status: DC

## 2015-10-07 NOTE — Progress Notes (Signed)
Patient ID: Mark White, male   DOB: Oct 30, 1955, 60 y.o.   MRN: [id number]   Reason for Appointment : Followup for Type 2 Diabetes  History of Present Illness          Diagnosis: Type 2 diabetes mellitus, date of diagnosis:   2013        Past history:  At diagnosis he was having borderline hyperglycemia. He had previously been taking metformin from his PCP. He was prescribed this twice a day but would tend to forget and take it once a day.  His A1c in early 2014 was 7.2   Because of his relatively high A1c of 7.5 and obesity he was started on Trulicity A999333 mg weekly in 06/2013  Recent history:   Previously in 09/2014 he had made substantial changes in his diet and lost over 20 pounds His A1c was also much better than usual at 5.8 However his weight has continued to increase with his not watching his diet, exercising or taking the Trulicity On his last visit he wanted to try to improve his diet and exercise but has not done so  Again he has relatively higher A1c of 6.7 now  Current blood sugar patterns and problems identified:  He has fairly good blood sugars in the mornings at home  He does have some relatively high readings over 200 occasionally after meals such as eating out [place] food and occasionally eating only fruits for breakfast  Still not motivated to exercise although is trying to be little more active recently  Weight has gone up another 4 pounds  Oral hypoglycemic drugs the patient is taking are: Metformin ER 750 mg, 2 tablets daily Side effects from medications have been: None      Compliance with the medical regimen:  Fair   Glucose monitoring:  done sporadically       Glucometer:    One Touch   Blood Glucose readings:  As above  Hypoglycemia: Never.           Self-care: The diet that the patient has been following is: No specific diet Diet history:  Typical breakfast is an egg, sausage without any bread but sometimes a fruit bowl  Physical  activity: exercise: some walking/yardwork         Dietician visit: Most recent: 5/14, does not want to pay for this again.      Weight history: Usually ranging from 290-305  Wt Readings from Last 3 Encounters:  10/07/15 291 lb 6.4 oz (132.178 kg)  06/08/15 287 lb 12.8 oz (130.545 kg)  02/05/15 283 lb 9.6 oz (128.64 kg)   Diabetes control: A1c on 05/16/13 was 7.5  Lab Results  Component Value Date   HGBA1C 6.7* 10/02/2015   HGBA1C 6.5 06/03/2015   HGBA1C 5.8 01/29/2015   Lab Results  Component Value Date   MICROALBUR <0.7 01/29/2015   [place] 94 01/29/2015   CREATININE 1.11 10/02/2015    Appointment on 10/02/2015  Component Date Value Ref Range Status  . Hgb A1c MFr Bld 10/02/2015 6.7* 4.6 - 6.5 % Final   Glycemic Control Guidelines for People with Diabetes:Non Diabetic:  <6%Goal of Therapy: <7%Additional Action Suggested:  >8%   . Sodium 10/02/2015 142  135 - 145 mEq/L Final  . Potassium 10/02/2015 3.6  3.5 - 5.1 mEq/L Final  . Chloride 10/02/2015 108  96 - 112 mEq/L Final  . CO2 10/02/2015 26  19 - 32 mEq/L Final  . Glucose, Bld 10/02/2015 104*  70 - 99 mg/dL Final  . BUN 10/02/2015 13  6 - 23 mg/dL Final  . Creatinine, Ser 10/02/2015 1.11  0.40 - 1.50 mg/dL Final  . Calcium 10/02/2015 9.5  8.4 - 10.5 mg/dL Final  . GFR 10/02/2015 71.78  >60.00 mL/min Final  . TSH 10/02/2015 2.01  0.35 - 4.50 uIU/mL Final      Medication List       This list is accurate as of: 10/07/15  9:27 PM.  Always use your most recent med list.               aspirin 81 MG tablet  Take 1 tablet (81 mg total) by mouth daily. STOP TAKING AND RESTART Dec 29 2011     atorvastatin 40 MG tablet  Commonly known as:  LIPITOR  Take 40 mg by mouth daily.     fenofibrate 145 MG tablet  Commonly known as:  TRICOR  Take 145 mg by mouth daily.     metFORMIN 750 MG 24 hr tablet  Commonly known as:  GLUCOPHAGE-XR  Take 2 tablets (1,500 mg total) by mouth daily with breakfast.     ONE TOUCH ULTRA  TEST test strip  Generic drug:  glucose blood  Use to check blood sugar once a day dx code 123456     [facility] DELICA LANCETS [id number] Misc  Use to check blood sugar once a day dx code 123456     TRULICITY 1.5 0000000 Sopn  Generic drug:  Dulaglutide  Inject into the skin.     valsartan 160 MG tablet  Commonly known as:  DIOVAN  Take 160 mg by mouth daily.        Allergies: No Known Allergies  Past Medical History  Diagnosis Date  . Hypertension   . Hyperlipidemia   . Personal history of adenomatous colonic polyps 01/19/2011  . Ulcer     30 years ago  . Sleep apnea     not using CPAP  . Diabetes mellitus without complication [facility])     Past Surgical History  Procedure Laterality Date  . Colonoscopy w/ polypectomy  01/13/11    3 polyps, largest 3 cm cecal polyp - adenomas  . Colonoscopy  12/14/2011    Procedure: COLONOSCOPY;  Surgeon: Gatha Mayer, MD;  Location: [facility];  Service: Endoscopy;  Laterality: N/A;  colon wiht APC for follow up of colon polyps  . Hot hemostasis  12/14/2011    Procedure: HOT HEMOSTASIS (ARGON PLASMA COAGULATION/BICAP);  Surgeon: Gatha Mayer, MD;  Location: [facility];  Service: Endoscopy;  Laterality: N/A;  . Colonoscopy N/A 10/10/2012    Procedure: COLONOSCOPY;  Surgeon: Gatha Mayer, MD;  Location: [facility];  Service: Endoscopy;  Laterality: N/A;  Need APC    Family History  Problem Relation Age of Onset  . Colon cancer Mother 48  . Heart disease Mother   . Obesity Neg Hx   . Sleep apnea Neg Hx   . Stroke Neg Hx   . Heart attack Neg Hx   . Heart disease Father     Social History:  reports that he has never smoked. He has never used smokeless tobacco. He reports that he drinks alcohol. He reports that he does not use illicit drugs.    Review of Systems       Lipids: Has been on Lipitor for about 10 years for hypercholesterolemia, followed by PCP. LDL is controlled but his HDL is  low  Lab Results  Component Value Date    CHOL 144 01/29/2015   HDL 28.00* 01/29/2015   LDLCALC 94 01/29/2015   TRIG 110.0 01/29/2015   CHOLHDL 5 01/29/2015            Sleep apnea: He is currently not using his CPAP; he does tend to get tired at times and sleepy  TSH is normal  Last foot exam in 3/16  Physical Examination:  BP 122/65 mmHg  Ht 6' (1.829 m)  Wt 291 lb 6.4 oz (132.178 kg)  BMI 39.51 kg/m2    ASSESSMENT:  Diabetes type 2, uncontrolled   He has had type II diabetes with obesity, overall mild and currently on metformin alone  As discussed in history of present illness his blood sugars are gradually going up He had wanted to try lifestyle changes and not restart Trulicity However his weight has gone up further and he is not able to comply with diet or start exercise regimen Although he thinks that he can do much better when he retires in 2 months discussed that he needs to start losing weight now especially with his history of sleep apnea   PLAN:   Improve diet and avoid high-fat foods Restart Trulicity 1.5 mg weekly Start regular walking or other exercise More postprandial monitoring  Follow-up in  4  Months  Patient Instructions  Check blood sugars on waking up 1-2 times a week  Also check blood sugars about 2 hours after a meal and do this after different meals by rotation  Recommended blood sugar levels on waking up is 90-130 and about 2 hours after meal is 130-160  Please bring your blood sugar monitor to each visit, thank you      [facility] 10/07/2015, 9:27 PM

## 2015-10-07 NOTE — Patient Instructions (Signed)
Check blood sugars on waking up 1-2   times a week Also check blood sugars about 2 hours after a meal and do this after different meals by rotation  Recommended blood sugar levels on waking up is 90-130 and about 2 hours after meal is 130-160  Please bring your blood sugar monitor to each visit, thank you  

## 2015-12-01 ENCOUNTER — Encounter: Payer: Self-pay | Admitting: Internal Medicine

## 2016-02-05 ENCOUNTER — Telehealth: Payer: Self-pay | Admitting: Endocrinology

## 2016-02-05 ENCOUNTER — Other Ambulatory Visit (INDEPENDENT_AMBULATORY_CARE_PROVIDER_SITE_OTHER): Payer: Self-pay

## 2016-02-05 DIAGNOSIS — E1165 Type 2 diabetes mellitus with hyperglycemia: Secondary | ICD-10-CM

## 2016-02-05 LAB — COMPREHENSIVE METABOLIC PANEL
ALT: 27 U/L (ref 0–53)
AST: 16 U/L (ref 0–37)
Albumin: 4.1 g/dL (ref 3.5–5.2)
Alkaline Phosphatase: 79 U/L (ref 39–117)
BUN: 16 mg/dL (ref 6–23)
CHLORIDE: 107 meq/L (ref 96–112)
CO2: 30 meq/L (ref 19–32)
CREATININE: 1.06 mg/dL (ref 0.40–1.50)
Calcium: 9.8 mg/dL (ref 8.4–10.5)
GFR: 75.61 mL/min (ref >60.00)
Glucose, Bld: 103 mg/dL — ABNORMAL HIGH (ref 70–99)
POTASSIUM: 3.9 meq/L (ref 3.5–5.1)
SODIUM: 141 meq/L (ref 135–145)
Total Bilirubin: 0.4 mg/dL (ref 0.2–1.2)
Total Protein: 6.6 g/dL (ref 6.0–8.3)

## 2016-02-05 LAB — MICROALBUMIN / CREATININE URINE RATIO
CREATININE, U: 84.4 mg/dL
Microalb Creat Ratio: 0.8 mg/g (ref 0.0–30.0)

## 2016-02-05 LAB — LIPID PANEL
CHOL/HDL RATIO: 5
CHOLESTEROL: 151 mg/dL (ref 0–200)
HDL: 27.8 mg/dL — AB (ref >39.00)
LDL CALC: 88 mg/dL (ref 0–99)
NonHDL: 123.69
TRIGLYCERIDES: 180 mg/dL — AB (ref 0.0–149.0)
VLDL: 36 mg/dL (ref 0.0–40.0)

## 2016-02-05 LAB — HEMOGLOBIN A1C: Hgb A1c MFr Bld: 6.8 % — ABNORMAL HIGH (ref 4.6–6.5)

## 2016-02-05 MED ORDER — ONETOUCH ULTRA BLUE VI STRP: ORAL_STRIP | Status: DC

## 2016-02-05 NOTE — Telephone Encounter (Signed)
Rx submitted per pt's request.  

## 2016-02-05 NOTE — Telephone Encounter (Signed)
Patient need a refill of ONE TOUCH ULTRA TEST test strip,  [id number] - [place], [place] - [address] [place] (Phone) [phone] (Fax)

## 2016-02-10 ENCOUNTER — Ambulatory Visit (INDEPENDENT_AMBULATORY_CARE_PROVIDER_SITE_OTHER): Payer: BLUE CROSS/BLUE SHIELD | Admitting: Endocrinology

## 2016-02-10 ENCOUNTER — Encounter: Payer: Self-pay | Admitting: Endocrinology

## 2016-02-10 VITALS — BP 126/62 | HR 73 | Ht 72.0 in | Wt 293.0 lb

## 2016-02-10 DIAGNOSIS — E1165 Type 2 diabetes mellitus with hyperglycemia: Secondary | ICD-10-CM

## 2016-02-10 MED ORDER — DULAGLUTIDE 1.5 MG/0.5ML ~~LOC~~ SOAJ: SUBCUTANEOUS | Status: DC

## 2016-02-10 NOTE — Progress Notes (Signed)
Patient ID: Mark White, male   DOB: 09/28/1955, 60 y.o.   MRN: [id number]   Reason for Appointment : Followup for Type 2 Diabetes  History of Present Illness          Diagnosis: Type 2 diabetes mellitus, date of diagnosis:   2013        Past history:  At diagnosis he was having borderline hyperglycemia. He had previously been taking metformin from his PCP. He was prescribed this twice a day but would tend to forget and take it once a day.  His A1c in early 2014 was 7.2   Because of his relatively high A1c of 7.5 and obesity he was started on Trulicity A999333 mg weekly in 06/2013  Recent history:   Previously in 09/2014 he had made substantial changes in his diet and lost over 20 pounds His A1c was also much better than usual at 5.8 However his weight has continued to increase with his not watching his diet, exercising or taking the Trulicity On his last visit he wanted to try to improve his diet and exercise but was also advised to restart Trulicity because of his tendency to weight gain and difficulty monitoring his diet   Again he has relatively higher A1c of 6.8, previously 6.7 and has been as low as 5.8  Current blood sugar patterns and problems identified:  He has checked blood sugars only in the last few days  His blood sugars are overall significantly high including fasting  He says he cannot watch his diet consistently especially on vacation  Has started walking but not losing weight   Oral hypoglycemic drugs the patient is taking are: Metformin ER 750 mg, 2 tablets daily irreg  Side effects from medications have been: None      Compliance with the medical regimen:  Fair   Glucose monitoring:  done sporadically       Glucometer:    One Touch   Blood Glucose readings:    Mean values apply above for all meters except median for One Touch  PRE-MEAL Fasting Lunch Dinner Bedtime Overall  Glucose range: 133-194    182, 273    Mean/median: 171  142    179    Self-care: The diet that the patient has been following is: No specific diet Diet history:  Typical breakfast is an egg, sausage without any bread but sometimes a fruit bowl  Physical activity: exercise:  walking/yardwork         Dietician visit: Most recent: 5/14, does not want to pay for this again.      Weight history: Usually ranging from 290-305  Wt Readings from Last 3 Encounters:  02/10/16 293 lb (132.904 kg)  10/07/15 291 lb 6.4 oz (132.178 kg)  06/08/15 287 lb 12.8 oz (130.545 kg)   Diabetes control: A1c on 05/16/13 was 7.5  Lab Results  Component Value Date   HGBA1C 6.8* 02/05/2016   HGBA1C 6.7* 10/02/2015   HGBA1C 6.5 06/03/2015   Lab Results  Component Value Date   MICROALBUR <0.7 02/05/2016   LDLCALC 88 02/05/2016   CREATININE 1.06 02/05/2016    Lab on 02/05/2016  Component Date Value Ref Range Status  . Hgb A1c MFr Bld 02/05/2016 6.8* 4.6 - 6.5 % Final   Glycemic Control Guidelines for People with Diabetes:Non Diabetic:  <6%Goal of Therapy: <7%Additional Action Suggested:  >8%   . Sodium 02/05/2016 141  135 - 145 mEq/L Final  . Potassium 02/05/2016 3.9  3.5 - 5.1 mEq/L Final  . Chloride 02/05/2016 107  96 - 112 mEq/L Final  . CO2 02/05/2016 30  19 - 32 mEq/L Final  . Glucose, Bld 02/05/2016 103* 70 - 99 mg/dL Final  . BUN 02/05/2016 16  6 - 23 mg/dL Final  . Creatinine, Ser 02/05/2016 1.06  0.40 - 1.50 mg/dL Final  . Total Bilirubin 02/05/2016 0.4  0.2 - 1.2 mg/dL Final  . Alkaline Phosphatase 02/05/2016 79  39 - 117 U/L Final  . AST 02/05/2016 16  0 - 37 U/L Final  . ALT 02/05/2016 27  0 - 53 U/L Final  . Total Protein 02/05/2016 6.6  6.0 - 8.3 g/dL Final  . Albumin 02/05/2016 4.1  3.5 - 5.2 g/dL Final  . Calcium 02/05/2016 9.8  8.4 - 10.5 mg/dL Final  . GFR 02/05/2016 75.61  >60.00 mL/min Final  . Microalb, Ur 02/05/2016 <0.7  0.0 - 1.9 mg/dL Final  . Creatinine,U 02/05/2016 84.4   Final  . Microalb Creat Ratio 02/05/2016 0.8  0.0 - 30.0 mg/g  Final  . Cholesterol 02/05/2016 151  0 - 200 mg/dL Final   ATP III Classification       Desirable:  < 200 mg/dL               Borderline High:  200 - 239 mg/dL          High:  > = 240 mg/dL  . Triglycerides 02/05/2016 180.0* 0.0 - 149.0 mg/dL Final   Normal:  <150 mg/dLBorderline High:  150 - 199 mg/dL  . HDL 02/05/2016 27.80* >39.00 mg/dL Final  . VLDL 02/05/2016 36.0  0.0 - 40.0 mg/dL Final  . LDL Cholesterol 02/05/2016 88  0 - 99 mg/dL Final  . Total CHOL/HDL Ratio 02/05/2016 5   Final                  Men          Women1/2 Average Risk     3.4          3.3Average Risk          5.0          4.42X Average Risk          9.6          7.13X Average Risk          15.0          11.0                      . NonHDL 02/05/2016 123.69   Final   NOTE:  Non-HDL goal should be 30 mg/dL higher than patient's LDL goal (i.e. LDL goal of < 70 mg/dL, would have non-HDL goal of < 100 mg/dL)      Medication List       This list is accurate as of: 02/10/16 11:59 PM.  Always use your most recent med list.               aspirin 81 MG tablet  Take 1 tablet (81 mg total) by mouth daily. STOP TAKING AND RESTART Dec 29 2011     atorvastatin 40 MG tablet  Commonly known as:  LIPITOR  Take 40 mg by mouth daily.     Dulaglutide 1.5 MG/0.5ML Sopn  Commonly known as:  TRULICITY  Inject 1 time per week     fenofibrate 145 MG tablet  Commonly known as:  TRICOR  Take  145 mg by mouth daily.     metFORMIN 750 MG 24 hr tablet  Commonly known as:  GLUCOPHAGE-XR  Take 2 tablets (1,500 mg total) by mouth daily with breakfast.     ONE TOUCH ULTRA TEST test strip  Generic drug:  glucose blood  Use to check blood sugar once a day dx code 123456     [facility] DELICA LANCETS [id number] Misc  Use to check blood sugar once a day dx code E11.65     valsartan 160 MG tablet  Commonly known as:  DIOVAN  Take 160 mg by mouth daily.        Allergies: No Known Allergies  Past Medical History  Diagnosis Date  .  Hypertension   . Hyperlipidemia   . Personal history of adenomatous colonic polyps 01/19/2011  . Ulcer     30 years ago  . Sleep apnea     not using CPAP  . Diabetes mellitus without complication [facility])     Past Surgical History  Procedure Laterality Date  . Colonoscopy w/ polypectomy  01/13/11    3 polyps, largest 3 cm cecal polyp - adenomas  . Colonoscopy  12/14/2011    Procedure: COLONOSCOPY;  Surgeon: Gatha Mayer, MD;  Location: [facility];  Service: Endoscopy;  Laterality: N/A;  colon wiht APC for follow up of colon polyps  . Hot hemostasis  12/14/2011    Procedure: HOT HEMOSTASIS (ARGON PLASMA COAGULATION/BICAP);  Surgeon: Gatha Mayer, MD;  Location: [facility];  Service: Endoscopy;  Laterality: N/A;  . Colonoscopy N/A 10/10/2012    Procedure: COLONOSCOPY;  Surgeon: Gatha Mayer, MD;  Location: [facility];  Service: Endoscopy;  Laterality: N/A;  Need APC    Family History  Problem Relation Age of Onset  . Colon cancer Mother 17  . Heart disease Mother   . Obesity Neg Hx   . Sleep apnea Neg Hx   . Stroke Neg Hx   . Heart attack Neg Hx   . Heart disease Father     Social History:  reports that he has never smoked. He has never used smokeless tobacco. He reports that he drinks alcohol. He reports that he does not use illicit drugs.    Review of Systems       Lipids: Has been on Lipitor for about 10 years for hypercholesterolemia, followed by PCP. LDL is controlled but his HDL is  low  Lab Results  Component Value Date   CHOL 151 02/05/2016   HDL 27.80* 02/05/2016   LDLCALC 88 02/05/2016   TRIG 180.0* 02/05/2016   CHOLHDL 5 02/05/2016            Sleep apnea: He is currently not using his CPAP; he does tend to get tired at times and sleepy   Last foot exam in 3/17  Physical Examination:  BP 126/62 mmHg  Pulse 73  Ht 6' (1.829 m)  Wt 293 lb (132.904 kg)  BMI 39.73 kg/m2  SpO2 95%  ASSESSMENT:  Diabetes type 2, uncontrolled   He has had type  II diabetes with obesity, overall mild and currently on metformin alone  As discussed in history of present illness his blood sugars are relatively higher recently He continues to gain weight slowly Discussed despite is starting to walk regularly after retirement He was told to start Trulicity previously but he has been resistant to doing this   PLAN:   Improve diet as discussed Restart Trulicity 1.5 mg weekly  Follow-up in  3 months  There are no Patient Instructions on file for this visit.   Del Overfelt 02/11/2016, 8:01 AM

## 2016-05-12 ENCOUNTER — Other Ambulatory Visit (INDEPENDENT_AMBULATORY_CARE_PROVIDER_SITE_OTHER): Payer: BLUE CROSS/BLUE SHIELD

## 2016-05-12 DIAGNOSIS — E1165 Type 2 diabetes mellitus with hyperglycemia: Secondary | ICD-10-CM | POA: Diagnosis not present

## 2016-05-12 LAB — BASIC METABOLIC PANEL
BUN: 17 mg/dL (ref 6–23)
CHLORIDE: 107 meq/L (ref 96–112)
CO2: 27 meq/L (ref 19–32)
Calcium: 9.8 mg/dL (ref 8.4–10.5)
Creatinine, Ser: 1.06 mg/dL (ref 0.40–1.50)
GFR: 75.54 mL/min (ref >60.00)
GLUCOSE: 87 mg/dL (ref 70–99)
POTASSIUM: 3.8 meq/L (ref 3.5–5.1)
SODIUM: 141 meq/L (ref 135–145)

## 2016-05-12 LAB — HEMOGLOBIN A1C: Hgb A1c MFr Bld: 6.5 % (ref 4.6–6.5)

## 2016-05-16 ENCOUNTER — Ambulatory Visit (INDEPENDENT_AMBULATORY_CARE_PROVIDER_SITE_OTHER): Payer: BLUE CROSS/BLUE SHIELD | Admitting: Endocrinology

## 2016-05-16 ENCOUNTER — Other Ambulatory Visit: Payer: Self-pay | Admitting: *Deleted

## 2016-05-16 ENCOUNTER — Encounter: Payer: Self-pay | Admitting: Endocrinology

## 2016-05-16 VITALS — BP 132/82 | HR 86 | Temp 98.2°F | Resp 16 | Ht 72.0 in | Wt 289.0 lb

## 2016-05-16 DIAGNOSIS — E1165 Type 2 diabetes mellitus with hyperglycemia: Secondary | ICD-10-CM | POA: Diagnosis not present

## 2016-05-16 DIAGNOSIS — Z23 Encounter for immunization: Secondary | ICD-10-CM

## 2016-05-16 MED ORDER — DULAGLUTIDE 0.75 MG/0.5ML ~~LOC~~ SOAJ: SUBCUTANEOUS | 2 refills | Status: DC

## 2016-05-16 NOTE — Patient Instructions (Signed)
Check blood sugars on waking up  weekly  Also check blood sugars about 2 hours after a meal and do this after different meals by rotation  Recommended blood sugar levels on waking up is 90-130 and about 2 hours after meal is 130-160  Please bring your blood sugar monitor to each visit, thank you  Trulicity 0.75mg  weekly

## 2016-05-16 NOTE — Progress Notes (Signed)
Patient ID: Mark White, male   DOB: 02/20/1956, 60 y.o.   MRN: [id number]   Reason for Appointment : Followup for Type 2 Diabetes  History of Present Illness          Diagnosis: Type 2 diabetes mellitus, date of diagnosis:   2013        Past history:  At diagnosis he was having borderline hyperglycemia. He had previously been taking metformin from his PCP. He was prescribed this twice a day but would tend to forget and take it once a day.  His A1c in early 2014 was 7.2   Because of his relatively high A1c of 7.5 and obesity he was started on Trulicity A999333 mg weekly in 06/2013  Recent history:   Previously in 09/2014 he had made substantial changes in his diet and lost over 20 pounds His A1c was also much better than usual at 5.8  On his last visit he agreed to start Trulicity because of difficulty getting his weight down and A1c going up to 6.8 A has difficulty watching his diet consistently  Again he has relatively higher A1c of 6.8, previously 6.7 and has been as low as 5.8  Current blood sugar patterns and problems identified:  He has checked blood sugars only sporadically in the last few weeks  Although his blood sugars were higher fasting previously he has not checked morning readings  He did try the TRULICITY after his last visit but he thought that it made him feel sleepy and had other nonspecific symptoms but not nausea.  He did not call to report this  His blood sugars are overall however improving and his weight is also down slightly  This may be because of his recently retiring in trying to be generally active   Oral hypoglycemic drugs the patient is taking are: Metformin ER 750 mg, 2 tablets daily  Side effects from medications have been: None      Compliance with the medical regimen:  Fair   Glucose monitoring:  done sporadically       Glucometer:    One Touch   Blood Glucose readings: 167, 97, 126,   Self-care: The diet that the patient has  been following is: No specific diet Diet history:  Typical breakfast is an egg, [place] sausage with  bread but sometimes a fruit bowl  Physical activity: exercise:  Walking/yardwork, recent knee pain         Dietician visit: Most recent: 5/14, does not want to pay for this again.      Weight history: Usually ranging from 290-305  Wt Readings from Last 3 Encounters:  05/16/16 289 lb (131.1 kg)  02/10/16 293 lb (132.9 kg)  10/07/15 291 lb 6.4 oz (132.2 kg)   Diabetes control: A1c on 05/16/13 was 7.5  Lab Results  Component Value Date   HGBA1C 6.5 05/12/2016   HGBA1C 6.8 (H) 02/05/2016   HGBA1C 6.7 (H) 10/02/2015   Lab Results  Component Value Date   MICROALBUR <0.7 02/05/2016   LDLCALC 88 02/05/2016   CREATININE 1.06 05/12/2016    Lab on 05/12/2016  Component Date Value Ref Range Status  . Hgb A1c MFr Bld 05/12/2016 6.5  4.6 - 6.5 % Final  . Sodium 05/12/2016 141  135 - 145 mEq/L Final  . Potassium 05/12/2016 3.8  3.5 - 5.1 mEq/L Final  . Chloride 05/12/2016 107  96 - 112 mEq/L Final  . CO2 05/12/2016 27  19 - 32 mEq/L  Final  . Glucose, Bld 05/12/2016 87  70 - 99 mg/dL Final  . BUN 05/12/2016 17  6 - 23 mg/dL Final  . Creatinine, Ser 05/12/2016 1.06  0.40 - 1.50 mg/dL Final  . Calcium 05/12/2016 9.8  8.4 - 10.5 mg/dL Final  . GFR 05/12/2016 75.54  >60.00 mL/min Final      Medication List       Accurate as of 05/16/16  9:13 PM. Always use your most recent med list.          aspirin 81 MG tablet Take 1 tablet (81 mg total) by mouth daily. STOP TAKING AND RESTART Dec 29 2011   atorvastatin 40 MG tablet Commonly known as:  LIPITOR Take 40 mg by mouth daily.   Dulaglutide 0.75 MG/0.5ML Sopn Commonly known as:  TRULICITY Inject the contents of one pen once per week   fenofibrate 145 MG tablet Commonly known as:  TRICOR Take 145 mg by mouth daily.   metFORMIN 750 MG 24 hr tablet Commonly known as:  GLUCOPHAGE-XR Take 2 tablets (1,500 mg total) by mouth  daily with breakfast.   ONE TOUCH ULTRA TEST test strip Generic drug:  glucose blood Use to check blood sugar once a day dx code 123456   [facility] DELICA LANCETS [id number] Misc Use to check blood sugar once a day dx code E11.65   valsartan 160 MG tablet Commonly known as:  DIOVAN Take 160 mg by mouth daily.       Allergies: No Known Allergies  Past Medical History:  Diagnosis Date  . Diabetes mellitus without complication ([place])   . Hyperlipidemia   . Hypertension   . Personal history of adenomatous colonic polyps 01/19/2011  . Sleep apnea    not using CPAP  . Ulcer ([place])    30 years ago    Past Surgical History:  Procedure Laterality Date  . COLONOSCOPY  12/14/2011   Procedure: COLONOSCOPY;  Surgeon: Gatha Mayer, MD;  Location: [facility];  Service: Endoscopy;  Laterality: N/A;  colon wiht APC for follow up of colon polyps  . COLONOSCOPY N/A 10/10/2012   Procedure: COLONOSCOPY;  Surgeon: Gatha Mayer, MD;  Location: [facility];  Service: Endoscopy;  Laterality: N/A;  Need APC  . COLONOSCOPY W/ POLYPECTOMY  01/13/11   3 polyps, largest 3 cm cecal polyp - adenomas  . HOT HEMOSTASIS  12/14/2011   Procedure: HOT HEMOSTASIS (ARGON PLASMA COAGULATION/BICAP);  Surgeon: Gatha Mayer, MD;  Location: [facility];  Service: Endoscopy;  Laterality: N/A;    Family History  Problem Relation Age of Onset  . Colon cancer Mother 69  . Heart disease Mother   . Obesity Neg Hx   . Sleep apnea Neg Hx   . Stroke Neg Hx   . Heart attack Neg Hx   . Heart disease Father     Social History:  reports that he has never smoked. He has never used smokeless tobacco. He reports that he drinks alcohol. He reports that he does not use drugs.    Review of Systems       Lipids: Has been on Lipitor for about 10 years for hypercholesterolemia, followed by PCP. LDL is controlled but his HDL has been  low  Lab Results  Component Value Date   CHOL 151 02/05/2016   HDL 27.80 (L) 02/05/2016    LDLCALC 88 02/05/2016   TRIG 180.0 (H) 02/05/2016   CHOLHDL 5 02/05/2016  Sleep apnea: He is currently not using his CPAP; he does tend to get tired at times and sleepy  Last foot exam in 3/17  Physical Examination:  BP 132/82   Pulse 86   Temp 98.2 F (36.8 C)   Resp 16   Ht 6' (1.829 m)   Wt 289 lb (131.1 kg)   SpO2 99%   BMI 39.20 kg/m   ASSESSMENT:  Diabetes type 2, With obesity   He has had type II diabetes with obesity, overall mild and currently on metformin alone  As discussed in history of present illness his blood sugars are under fair control but difficult to know if he is having higher readings at times as he monitors very sporadically Although he has lost a little weight he can do much better with weight loss He reported somnolence and other symptoms with Trulicity but not GI side effects with the 1.5 mg dose, however previously had taken 0.75 without difficulty   PLAN:   Improve diet and increase exercise as tolerated More consistent glucose monitoring Restart Trulicity with A999333 mg weekly dose  Follow-up in 3 months  Patient Instructions  Check blood sugars on waking up  weekly  Also check blood sugars about 2 hours after a meal and do this after different meals by rotation  Recommended blood sugar levels on waking up is 90-130 and about 2 hours after meal is 130-160  Please bring your blood sugar monitor to each visit, thank you  Trulicity 0.75mg  weekly     Mark White 05/16/2016, 9:13 PM

## 2016-05-24 ENCOUNTER — Encounter: Payer: Self-pay | Admitting: Internal Medicine

## 2016-06-30 ENCOUNTER — Ambulatory Visit (AMBULATORY_SURGERY_CENTER): Payer: Self-pay | Admitting: *Deleted

## 2016-06-30 VITALS — Ht 72.0 in | Wt 289.0 lb

## 2016-06-30 DIAGNOSIS — Z86010 Personal history of colonic polyps: Secondary | ICD-10-CM

## 2016-06-30 NOTE — Progress Notes (Signed)
No egg or soy allergy known to patient  No issues with past sedation with any surgeries  or procedures, no intubation problems  No diet pills per patient No home 02 use per patient  No blood thinners per patient  Pt denies issues with constipation  No A fib or A flutter   emmi declined'   

## 2016-07-13 ENCOUNTER — Encounter: Payer: Self-pay | Admitting: Internal Medicine

## 2016-07-13 ENCOUNTER — Ambulatory Visit (AMBULATORY_SURGERY_CENTER): Payer: BLUE CROSS/BLUE SHIELD | Admitting: Internal Medicine

## 2016-07-13 VITALS — BP 166/67 | HR 53 | Temp 98.0°F | Resp 15 | Ht 72.0 in | Wt 289.0 lb

## 2016-07-13 DIAGNOSIS — D123 Benign neoplasm of transverse colon: Secondary | ICD-10-CM | POA: Diagnosis not present

## 2016-07-13 DIAGNOSIS — Z86010 Personal history of colonic polyps: Secondary | ICD-10-CM | POA: Diagnosis not present

## 2016-07-13 MED ORDER — SODIUM CHLORIDE 0.9 % IV SOLN: 500.0000 mL | INTRAVENOUS | Status: DC

## 2016-07-13 NOTE — Op Note (Signed)
[facility] Patient Name: Mark White Procedure Date: 07/13/2016 2:08 PM MRN: [id number] Endoscopist: Gatha Mayer , MD Age: 60 Referring MD:  Date of Birth: 07-02-1956 Gender: Male Account #: [id number] Procedure:                Colonoscopy Indications:              Surveillance: Personal history of adenomatous                            polyps on last colonoscopy > 3 years ago Medicines:                Monitored Anesthesia Care Procedure:                Pre-Anesthesia Assessment:                           - Prior to the procedure, a History and Physical                            was performed, and patient medications and                            allergies were reviewed. The patient's tolerance of                            previous anesthesia was also reviewed. The risks                            and benefits of the procedure and the sedation                            options and risks were discussed with the patient.                            All questions were answered, and informed consent                            was obtained. Prior Anticoagulants: The patient                            last took aspirin 1 day prior to the procedure. ASA                            Grade Assessment: II - A patient with mild systemic                            disease. After reviewing the risks and benefits,                            the patient was deemed in satisfactory condition to                            undergo the procedure.  After obtaining informed consent, the colonoscope                            was passed under direct vision. Throughout the                            procedure, the patient's blood pressure, pulse, and                            oxygen saturations were monitored continuously. The                            Model CF-H180AL [id number]) scope was introduced                            through the anus and advanced to the  the cecum,                            identified by appendiceal orifice and ileocecal                            valve. The ileocecal valve, appendiceal orifice,                            and rectum were photographed. The quality of the                            bowel preparation was adequate. The bowel                            preparation used was Miralax. The colonoscopy was                            somewhat difficult due to significant looping.                            Successful completion of the procedure was aided by                            applying abdominal pressure. The patient tolerated                            the procedure well. Scope In: 2:18:41 PM Scope Out: 2:49:16 PM Scope Withdrawal Time: 0 hours 25 minutes 17 seconds  Total Procedure Duration: 0 hours 30 minutes 35 seconds  Findings:                 The perianal and digital rectal examinations were                            normal. Pertinent negatives include normal prostate                            (size, shape, and consistency).  Three sessile polyps were found in the transverse                            colon. The polyps were 3 to 7 mm in size. These                            polyps were removed with a cold snare. Resection                            and retrieval were complete. Verification of                            patient identification for the specimen was done.                            Estimated blood loss was minimal.                           The exam was otherwise without abnormality on                            direct and retroflexion views. Complications:            No immediate complications. Estimated Blood Loss:     Estimated blood loss was minimal. Impression:               - Three 3 to 7 mm polyps in the transverse colon,                            removed with a cold snare. Resected and retrieved.                           - The examination was  otherwise normal on direct                            and retroflexion views.                           - Personal history of colonic polyps. Recommendation:           - Patient has a contact number available for                            emergencies. The signs and symptoms of potential                            delayed complications were discussed with the                            patient. Return to normal activities tomorrow.                            Written discharge instructions were provided to the  patient.                           - Resume previous diet.                           - Continue present medications.                           - Repeat colonoscopy date to be determined after                            pending pathology results are reviewed for                            surveillance.                           - different vs more prep next time Gatha Mayer, MD 07/13/2016 3:03:28 PM This report has been signed electronically.

## 2016-07-13 NOTE — Progress Notes (Signed)
Called to room to assist during endoscopic procedure.  Patient ID and intended procedure confirmed with present staff. Received instructions for my participation in the procedure from the performing physician.  

## 2016-07-13 NOTE — Progress Notes (Signed)
Report to PACU, RN, vss, BBS= Clear.  

## 2016-07-13 NOTE — Patient Instructions (Addendum)
I found and removed 3 small polyps that look benign.  I will let you know pathology results and when to have another routine colonoscopy by mail.  I appreciate the opportunity to care for you. Gatha Mayer, MD, [facility]  Colon polyps handout given today. Call us with any questions or concerns. Thank you!  YOU HAD AN ENDOSCOPIC PROCEDURE TODAY AT [facility]:   Refer to the procedure report that was given to you for any specific questions about what was found during the examination.  If the procedure report does not answer your questions, please call your gastroenterologist to clarify.  If you requested that your care partner not be given the details of your procedure findings, then the procedure report has been included in a sealed envelope for you to review at your convenience later.  YOU SHOULD EXPECT: Some feelings of bloating in the abdomen. Passage of more gas than usual.  Walking can help get rid of the air that was put into your GI tract during the procedure and reduce the bloating. If you had a lower endoscopy (such as a colonoscopy or flexible sigmoidoscopy) you may notice spotting of blood in your stool or on the toilet paper. If you underwent a bowel prep for your procedure, you may not have a normal bowel movement for a few days.  Please Note:  You might notice some irritation and congestion in your nose or some drainage.  This is from the oxygen used during your procedure.  There is no need for concern and it should clear up in a day or so.  SYMPTOMS TO REPORT IMMEDIATELY:   Following lower endoscopy (colonoscopy or flexible sigmoidoscopy):  Excessive amounts of blood in the stool  Significant tenderness or worsening of abdominal pains  Swelling of the abdomen that is new, acute  Fever of 100F or higher  For urgent or emergent issues, a gastroenterologist can be reached at any hour by calling [phone].   DIET:  We do recommend a small meal at  first, but then you may proceed to your regular diet.  Drink plenty of fluids but you should avoid alcoholic beverages for 24 hours.  ACTIVITY:  You should plan to take it easy for the rest of today and you should NOT DRIVE or use heavy machinery until tomorrow (because of the sedation medicines used during the test).    FOLLOW UP: Our staff will call the number listed on your records the next business day following your procedure to check on you and address any questions or concerns that you may have regarding the information given to you following your procedure. If we do not reach you, we will leave a message.  However, if you are feeling well and you are not experiencing any problems, there is no need to return our call.  We will assume that you have returned to your regular daily activities without incident.  If any biopsies were taken you will be contacted by phone or by letter within the next 1-3 weeks.  Please call us at [phone] if you have not heard about the biopsies in 3 weeks.    SIGNATURES/CONFIDENTIALITY: You and/or your care partner have signed paperwork which will be entered into your electronic medical record.  These signatures attest to the fact that that the information above on your After Visit Summary has been reviewed and is understood.  Full responsibility of the confidentiality of this discharge information lies with you and/or your  care-partner.

## 2016-07-14 ENCOUNTER — Telehealth: Payer: Self-pay | Admitting: *Deleted

## 2016-07-14 NOTE — Telephone Encounter (Signed)
  Follow up Call-  Call back number 07/13/2016  Post procedure Call Back phone  # [phone]  Permission to leave phone message Yes  Some recent data might be hidden     Patient questions:  Do you have a fever, pain , or abdominal swelling? No. Pain Score  0 *  Have you tolerated food without any problems? Yes.    Have you been able to return to your normal activities? Yes.    Do you have any questions about your discharge instructions: Diet   No. Medications  No. Follow up visit  No.  Do you have questions or concerns about your Care? No.  Actions: * If pain score is 4 or above: No action needed, pain <4.

## 2016-07-27 ENCOUNTER — Encounter: Payer: Self-pay | Admitting: Internal Medicine

## 2016-07-27 NOTE — Progress Notes (Signed)
3 adenomas all < 1 cm Recall 07/2019 colon  No letter - My Chart notice sent

## 2016-08-11 ENCOUNTER — Other Ambulatory Visit (INDEPENDENT_AMBULATORY_CARE_PROVIDER_SITE_OTHER): Payer: BLUE CROSS/BLUE SHIELD

## 2016-08-11 DIAGNOSIS — E1165 Type 2 diabetes mellitus with hyperglycemia: Secondary | ICD-10-CM | POA: Diagnosis not present

## 2016-08-11 LAB — HEMOGLOBIN A1C: Hgb A1c MFr Bld: 6.2 % (ref 4.6–6.5)

## 2016-08-11 LAB — COMPREHENSIVE METABOLIC PANEL
ALBUMIN: 4.1 g/dL (ref 3.5–5.2)
ALT: 21 U/L (ref 0–53)
AST: 18 U/L (ref 0–37)
Alkaline Phosphatase: 61 U/L (ref 39–117)
BILIRUBIN TOTAL: 0.5 mg/dL (ref 0.2–1.2)
BUN: 20 mg/dL (ref 6–23)
CALCIUM: 9.7 mg/dL (ref 8.4–10.5)
CHLORIDE: 106 meq/L (ref 96–112)
CO2: 27 mEq/L (ref 19–32)
CREATININE: 1.07 mg/dL (ref 0.40–1.50)
GFR: 74.67 mL/min (ref >60.00)
Glucose, Bld: 91 mg/dL (ref 70–99)
Potassium: 3.8 mEq/L (ref 3.5–5.1)
SODIUM: 140 meq/L (ref 135–145)
TOTAL PROTEIN: 6.6 g/dL (ref 6.0–8.3)

## 2016-08-16 ENCOUNTER — Ambulatory Visit: Payer: BLUE CROSS/BLUE SHIELD | Admitting: Endocrinology

## 2016-08-16 ENCOUNTER — Ambulatory Visit: Payer: BC Managed Care – PPO | Admitting: Internal Medicine

## 2016-08-30 ENCOUNTER — Encounter: Payer: Self-pay | Admitting: Endocrinology

## 2016-08-30 ENCOUNTER — Other Ambulatory Visit: Payer: Self-pay

## 2016-08-30 ENCOUNTER — Ambulatory Visit (INDEPENDENT_AMBULATORY_CARE_PROVIDER_SITE_OTHER): Payer: BLUE CROSS/BLUE SHIELD | Admitting: Endocrinology

## 2016-08-30 VITALS — BP 144/68 | HR 64 | Ht 72.0 in | Wt 289.0 lb

## 2016-08-30 DIAGNOSIS — E119 Type 2 diabetes mellitus without complications: Secondary | ICD-10-CM | POA: Diagnosis not present

## 2016-08-30 MED ORDER — ONETOUCH ULTRA BLUE VI STRP: ORAL_STRIP | 1 refills | Status: DC

## 2016-08-30 MED ORDER — DULAGLUTIDE 1.5 MG/0.5ML ~~LOC~~ SOAJ: SUBCUTANEOUS | 4 refills | Status: DC

## 2016-08-30 NOTE — Progress Notes (Signed)
Patient ID: Mark White, male   DOB: 05-28-1956, 61 y.o.   MRN: [id number]   Reason for Appointment : Followup for Type 2 Diabetes  History of Present Illness          Diagnosis: Type 2 diabetes mellitus, date of diagnosis:   2013        Past history:  At diagnosis he was having borderline hyperglycemia. He had previously been taking metformin from his PCP. He was prescribed this twice a day but would tend to forget and take it once a day.  His A1c in early 2014 was 7.2   Because of his relatively high A1c of 7.5 and obesity he was started on Trulicity [id number] mg weekly in 06/2013  Recent history:   Previously in 09/2014 he had made substantial changes in his diet and lost over 20 pounds His A1c was also much better than usual at 5.8  On his last visit he agreed to start Trulicity because of difficulty getting his weight down and A1c going up to 6.8 He has difficulty watching his diet consistently  His A1c has come down significantly to 6.2%, previously 6.5  Current blood sugar patterns and problems identified:  He has been able to take Trulicity and although he was started on 0.75 he wanted to go up to the 1.5 dose which he has good  He tends to have nausea for the first day or 2 but he thinks this is tolerable  However he has not lost any weight  He still does not make good choices in his diet and sometimes will have sweets like cookies  He is checking blood sugars mostly after breakfast and these are occasionally higher  Because of knee problems he has not done any walking for exercise  Oral hypoglycemic drugs the patient is taking are: Metformin ER 750 mg, 2 tablets daily  Side effects from medications have been: None      Compliance with the medical regimen:  Fair   Glucose monitoring:  done sporadically       Glucometer:    One Touch   Blood Glucose readings: Overall median 144  AFTER breakfast 135-171 After lunch 85-194  Self-care: The diet that the  patient has been following is: No specific diet Diet history:  Typical breakfast is an egg, [place] sausage with  bread but sometimes a fruit bowl  Physical activity: exercise:  House work       Dietician visit: Most recent: 5/14, does not want to pay for this again.      Weight history: Usually ranging from 290-305  Wt Readings from Last 3 Encounters:  08/30/16 289 lb (131.1 kg)  07/13/16 289 lb (131.1 kg)  06/30/16 289 lb (131.1 kg)    Diabetes control:    Lab Results  Component Value Date   HGBA1C 6.2 08/11/2016   HGBA1C 6.5 05/12/2016   HGBA1C 6.8 (H) 02/05/2016   Lab Results  Component Value Date   MICROALBUR <0.7 02/05/2016   LDLCALC 88 02/05/2016   CREATININE 1.07 08/11/2016    No visits with results within 1 Week(s) from this visit.  Latest known visit with results is:  Lab on 08/11/2016  Component Date Value Ref Range Status  . Hgb A1c MFr Bld 08/11/2016 6.2  4.6 - 6.5 % Final  . Sodium 08/11/2016 140  135 - 145 mEq/L Final  . Potassium 08/11/2016 3.8  3.5 - 5.1 mEq/L Final  . Chloride 08/11/2016 106  96 -  112 mEq/L Final  . CO2 08/11/2016 27  19 - 32 mEq/L Final  . Glucose, Bld 08/11/2016 91  70 - 99 mg/dL Final  . BUN 08/11/2016 20  6 - 23 mg/dL Final  . Creatinine, Ser 08/11/2016 1.07  0.40 - 1.50 mg/dL Final  . Total Bilirubin 08/11/2016 0.5  0.2 - 1.2 mg/dL Final  . Alkaline Phosphatase 08/11/2016 61  39 - 117 U/L Final  . AST 08/11/2016 18  0 - 37 U/L Final  . ALT 08/11/2016 21  0 - 53 U/L Final  . Total Protein 08/11/2016 6.6  6.0 - 8.3 g/dL Final  . Albumin 08/11/2016 4.1  3.5 - 5.2 g/dL Final  . Calcium 08/11/2016 9.7  8.4 - 10.5 mg/dL Final  . GFR 08/11/2016 74.67  >60.00 mL/min Final    Allergies as of 08/30/2016   No Known Allergies     Medication List       Accurate as of 08/30/16  3:05 PM. Always use your most recent med list.          aspirin 81 MG tablet Take 1 tablet (81 mg total) by mouth daily. STOP TAKING AND RESTART Dec 29 2011   atorvastatin 40 MG tablet Commonly known as:  LIPITOR Take 40 mg by mouth daily.   Dulaglutide 0.75 MG/0.5ML Sopn Commonly known as:  TRULICITY Inject the contents of one pen once per week   fenofibrate 145 MG tablet Commonly known as:  TRICOR Take 145 mg by mouth daily.   meloxicam 15 MG tablet Commonly known as:  MOBIC   metFORMIN 750 MG 24 hr tablet Commonly known as:  GLUCOPHAGE-XR Take 2 tablets (1,500 mg total) by mouth daily with breakfast.   ONE TOUCH ULTRA TEST test strip Generic drug:  glucose blood Use to check blood sugar once a day dx code 123456   [facility] DELICA LANCETS 99991111 Misc Use to check blood sugar once a day dx code E11.65   valsartan 160 MG tablet Commonly known as:  DIOVAN Take 160 mg by mouth daily.       Allergies: No Known Allergies  Past Medical History:  Diagnosis Date  . Diabetes mellitus without complication ([place])   . Hyperlipidemia   . Hypertension   . Personal history of adenomatous colonic polyps 01/19/2011  . Sleep apnea    not using CPAP  . Ulcer ([place])    30 years ago    Past Surgical History:  Procedure Laterality Date  . COLONOSCOPY  12/14/2011   Procedure: COLONOSCOPY;  Surgeon: Gatha Mayer, MD;  Location: [facility];  Service: Endoscopy;  Laterality: N/A;  colon wiht APC for follow up of colon polyps  . COLONOSCOPY N/A 10/10/2012   Procedure: COLONOSCOPY;  Surgeon: Gatha Mayer, MD;  Location: [facility];  Service: Endoscopy;  Laterality: N/A;  Need APC  . COLONOSCOPY    . COLONOSCOPY W/ POLYPECTOMY  01/13/11   3 polyps, largest 3 cm cecal polyp - adenomas  . HOT HEMOSTASIS  12/14/2011   Procedure: HOT HEMOSTASIS (ARGON PLASMA COAGULATION/BICAP);  Surgeon: Gatha Mayer, MD;  Location: [facility];  Service: Endoscopy;  Laterality: N/A;  . POLYPECTOMY      Family History  Problem Relation Age of Onset  . Colon cancer Mother 19  . Heart disease Mother   . Heart disease Father   . Cancer - Other  Daughter     glioblastoma- died age 76   . Obesity Neg Hx   .  Sleep apnea Neg Hx   . Stroke Neg Hx   . Heart attack Neg Hx   . Colon polyps Neg Hx   . Rectal cancer Neg Hx   . Stomach cancer Neg Hx   . Esophageal cancer Neg Hx     Social History:  reports that he has never smoked. He has never used smokeless tobacco. He reports that he drinks alcohol. He reports that he does not use drugs.    Review of Systems       Lipids: Has been on Lipitor for about 10 years for hypercholesterolemia, followed by PCP. LDL is controlled but his HDL has been  low  Lab Results  Component Value Date   CHOL 151 02/05/2016   HDL 27.80 (L) 02/05/2016   LDLCALC 88 02/05/2016   TRIG 180.0 (H) 02/05/2016   CHOLHDL 5 02/05/2016            Sleep apnea: He is currently not using his CPAP  HYPERTENSION: He is on Diovan 160 mg daily.  Blood pressure has been generally well controlled but higher now, was also high at the time of his colonoscopy  Also he is taking meloxicam for at least a month from his PCP for knee joint pain  BP Readings from Last 3 Encounters:  08/30/16 (!) 144/68  07/13/16 (!) 166/67  05/16/16 132/82     Last foot exam in 3/17  Physical Examination:  BP (!) 144/68 (BP Location: Left Arm, Cuff Size: Large)   Pulse 64   Ht 6' (1.829 m)   Wt 289 lb (131.1 kg)   SpO2 96%   BMI 39.20 kg/m   ASSESSMENT:  Diabetes type 2, With obesity   He has had type II diabetes with obesity, overall mild  See history of present illness for detailed discussion of current diabetes management, blood sugar patterns and problems identified  As before and main difficulty is weight management which she is not able to do on his own However even with taking Trulicity since his last visit he has not lost any weight He does have some nausea with this but he thinks this is tolerable and does not complain of fatigue as he did previously  Currently still having relatively high postprandial  readings after breakfast and various snacks which he has not curtailed  HYPERTENSION: His blood pressure is higher than usual and may be related to his taking 15 mg of meloxicam   PLAN:   Improve diet and cut back on portions and high-fat snacks Start regular exercise Check some readings after supper also Continue Trulicity 1.5 mg but may consider using Ozempic on the next visit  Try taking only 7.5 mg meloxicam and follow-up with PCP for blood pressure   Patient Instructions  Try taking 1/2 tab Meloxicam  Check blood sugars on waking up 1-2x weekly  Also check blood sugars about 2 hours after a meal and do this after different meals by rotation  Recommended blood sugar levels on waking up is 90-130 and about 2 hours after meal is 130-160  Please bring your blood sugar monitor to each visit, thank you     [facility] 08/30/2016, 3:05 PM

## 2016-08-30 NOTE — Patient Instructions (Addendum)
Try taking 1/2 tab Meloxicam  Check blood sugars on waking up 1-2x weekly  Also check blood sugars about 2 hours after a meal and do this after different meals by rotation  Recommended blood sugar levels on waking up is 90-130 and about 2 hours after meal is 130-160  Please bring your blood sugar monitor to each visit, thank you

## 2016-12-22 ENCOUNTER — Other Ambulatory Visit (INDEPENDENT_AMBULATORY_CARE_PROVIDER_SITE_OTHER): Payer: BLUE CROSS/BLUE SHIELD

## 2016-12-22 DIAGNOSIS — E119 Type 2 diabetes mellitus without complications: Secondary | ICD-10-CM

## 2016-12-22 LAB — COMPREHENSIVE METABOLIC PANEL
ALK PHOS: 59 U/L (ref 39–117)
ALT: 26 U/L (ref 0–53)
AST: 19 U/L (ref 0–37)
Albumin: 4.4 g/dL (ref 3.5–5.2)
BUN: 20 mg/dL (ref 6–23)
CALCIUM: 9.8 mg/dL (ref 8.4–10.5)
CO2: 27 mEq/L (ref 19–32)
Chloride: 107 mEq/L (ref 96–112)
Creatinine, Ser: 1.05 mg/dL (ref 0.40–1.50)
GFR: 76.22 mL/min (ref >60.00)
GLUCOSE: 97 mg/dL (ref 70–99)
POTASSIUM: 4 meq/L (ref 3.5–5.1)
Sodium: 139 mEq/L (ref 135–145)
TOTAL PROTEIN: 6.8 g/dL (ref 6.0–8.3)
Total Bilirubin: 0.5 mg/dL (ref 0.2–1.2)

## 2016-12-22 LAB — MICROALBUMIN / CREATININE URINE RATIO
CREATININE, U: 173.5 mg/dL
MICROALB/CREAT RATIO: 0.4 mg/g (ref 0.0–30.0)

## 2016-12-22 LAB — LIPID PANEL
CHOLESTEROL: 108 mg/dL (ref 0–200)
HDL: 29.7 mg/dL — ABNORMAL LOW (ref >39.00)
LDL Cholesterol: 62 mg/dL (ref 0–99)
NONHDL: 78.11
Total CHOL/HDL Ratio: 4
Triglycerides: 79 mg/dL (ref 0.0–149.0)
VLDL: 15.8 mg/dL (ref 0.0–40.0)

## 2016-12-22 LAB — HEMOGLOBIN A1C: HEMOGLOBIN A1C: 6.4 % (ref 4.6–6.5)

## 2016-12-27 ENCOUNTER — Ambulatory Visit (INDEPENDENT_AMBULATORY_CARE_PROVIDER_SITE_OTHER): Payer: BLUE CROSS/BLUE SHIELD | Admitting: Endocrinology

## 2016-12-27 ENCOUNTER — Encounter: Payer: Self-pay | Admitting: Endocrinology

## 2016-12-27 VITALS — BP 133/74 | HR 73 | Ht 72.0 in | Wt 289.6 lb

## 2016-12-27 DIAGNOSIS — E1165 Type 2 diabetes mellitus with hyperglycemia: Secondary | ICD-10-CM | POA: Diagnosis not present

## 2016-12-27 NOTE — Progress Notes (Signed)
Patient ID: Mark White, male   DOB: 11/20/1955, 61 y.o.   MRN: [id number]   Reason for Appointment : Followup for Type 2 Diabetes  History of Present Illness          Diagnosis: Type 2 diabetes mellitus, date of diagnosis:   2013        Past history:  At diagnosis he was having borderline hyperglycemia. He had previously been taking metformin from his PCP. He was prescribed this twice a day but would tend to forget and take it once a day.  His A1c in early 2014 was 7.2   Because of his relatively high A1c of 7.5 and obesity he was started on Trulicity 7.82 mg weekly in 06/2013 Previously in 09/2014 he had made substantial changes in his diet and lost over 20 pounds His A1c with this was also much better than usual at 5.8  Recent history:   Non-insulin hypoglycemic drugs the patient is taking are: Metformin ER 750 mg, 2 tablets daily at dinnertime, Trulicity 1.5 mg weekly  His A1c has leveled off at 6.4 although has been down to 6.2 previously  Current blood sugar patterns and problems identified:  He has not been able to take any weight off on this visit  He now says again that Trulicity even at 1.5 mg weekly does not change his appetite and improve satiety  He will periodically have high carbohydrate or high-fat meals  Also checking blood sugar very sporadically and has only 2 days of monitoring on his download  Highest blood sugar 245 after eating daily and food  Fasting glucose 126 at home and 97 in the lab  Previously was forgetting to take metformin in the morning and is doing better with taking it in the evening now  Because of knee problems he has not done any walking again for exercise   Side effects from medications have been: None      Compliance with the medical regimen:  Fair   Glucose monitoring:  done sporadically       Glucometer:    One Touch   Blood Glucose readings: As above   Self-care: The diet that the patient has been following is: No  specific diet Diet history:  Typical breakfast is an egg, [place] sausage with  bread but sometimes a fruit bowl Periodically eating out  Physical activity: exercise:   Minimal      Dietician visit: Most recent: 5/14, does not want to pay for this again.      Weight history: Usually ranging from 290-305  Wt Readings from Last 3 Encounters:  12/27/16 289 lb 9.6 oz (131.4 kg)  08/30/16 289 lb (131.1 kg)  07/13/16 289 lb (131.1 kg)    Diabetes control:    Lab Results  Component Value Date   HGBA1C 6.4 12/22/2016   HGBA1C 6.2 08/11/2016   HGBA1C 6.5 05/12/2016   Lab Results  Component Value Date   MICROALBUR <0.7 12/22/2016   LDLCALC 62 12/22/2016   CREATININE 1.05 12/22/2016    Lab on 12/22/2016  Component Date Value Ref Range Status  . Hgb A1c MFr Bld 12/22/2016 6.4  4.6 - 6.5 % Final   Glycemic Control Guidelines for People with Diabetes:Non Diabetic:  <6%Goal of Therapy: <7%Additional Action Suggested:  >8%   . Sodium 12/22/2016 139  135 - 145 mEq/L Final  . Potassium 12/22/2016 4.0  3.5 - 5.1 mEq/L Final  . Chloride 12/22/2016 107  96 - 112 mEq/L  Final  . CO2 12/22/2016 27  19 - 32 mEq/L Final  . Glucose, Bld 12/22/2016 97  70 - 99 mg/dL Final  . BUN 12/22/2016 20  6 - 23 mg/dL Final  . Creatinine, Ser 12/22/2016 1.05  0.40 - 1.50 mg/dL Final  . Total Bilirubin 12/22/2016 0.5  0.2 - 1.2 mg/dL Final  . Alkaline Phosphatase 12/22/2016 59  39 - 117 U/L Final  . AST 12/22/2016 19  0 - 37 U/L Final  . ALT 12/22/2016 26  0 - 53 U/L Final  . Total Protein 12/22/2016 6.8  6.0 - 8.3 g/dL Final  . Albumin 12/22/2016 4.4  3.5 - 5.2 g/dL Final  . Calcium 12/22/2016 9.8  8.4 - 10.5 mg/dL Final  . GFR 12/22/2016 76.22  >60.00 mL/min Final  . Microalb, Ur 12/22/2016 <0.7  0.0 - 1.9 mg/dL Final  . Creatinine,U 12/22/2016 173.5  mg/dL Final  . Microalb Creat Ratio 12/22/2016 0.4  0.0 - 30.0 mg/g Final  . Cholesterol 12/22/2016 108  0 - 200 mg/dL Final   ATP III Classification        Desirable:  < 200 mg/dL               Borderline High:  200 - 239 mg/dL          High:  > = 240 mg/dL  . Triglycerides 12/22/2016 79.0  0.0 - 149.0 mg/dL Final   Normal:  <150 mg/dLBorderline High:  150 - 199 mg/dL  . HDL 12/22/2016 29.70* >39.00 mg/dL Final  . VLDL 12/22/2016 15.8  0.0 - 40.0 mg/dL Final  . LDL Cholesterol 12/22/2016 62  0 - 99 mg/dL Final  . Total CHOL/HDL Ratio 12/22/2016 4   Final                  Men          Women1/2 Average Risk     3.4          3.3Average Risk          5.0          4.42X Average Risk          9.6          7.13X Average Risk          15.0          11.0                      . NonHDL 12/22/2016 78.11   Final   NOTE:  Non-HDL goal should be 30 mg/dL higher than patient's LDL goal (i.e. LDL goal of < 70 mg/dL, would have non-HDL goal of < 100 mg/dL)    Allergies as of 12/27/2016   No Known Allergies     Medication List       Accurate as of 12/27/16  9:07 PM. Always use your most recent med list.          aspirin 81 MG tablet Take 1 tablet (81 mg total) by mouth daily. STOP TAKING AND RESTART Dec 29 2011   atorvastatin 40 MG tablet Commonly known as:  LIPITOR Take 40 mg by mouth daily.   Dulaglutide 1.5 MG/0.5ML Sopn Commonly known as:  TRULICITY Inject in the skin once weekly   fenofibrate 145 MG tablet Commonly known as:  TRICOR Take 145 mg by mouth daily.   meloxicam 15 MG tablet Commonly known as:  MOBIC   metFORMIN 750 MG 24  hr tablet Commonly known as:  GLUCOPHAGE-XR Take 2 tablets (1,500 mg total) by mouth daily with breakfast.   ONE TOUCH ULTRA TEST test strip Generic drug:  glucose blood Use to check blood sugar once a day dx code N46.27   [facility] DELICA LANCETS 03J Misc Use to check blood sugar once a day dx code E11.65   valsartan 160 MG tablet Commonly known as:  DIOVAN Take 160 mg by mouth daily.       Allergies: No Known Allergies  Past Medical History:  Diagnosis Date  . Diabetes mellitus without  complication ([place])   . Hyperlipidemia   . Hypertension   . Personal history of adenomatous colonic polyps 01/19/2011  . Sleep apnea    not using CPAP  . Ulcer    30 years ago    Past Surgical History:  Procedure Laterality Date  . COLONOSCOPY  12/14/2011   Procedure: COLONOSCOPY;  Surgeon: Gatha Mayer, MD;  Location: [facility];  Service: Endoscopy;  Laterality: N/A;  colon wiht APC for follow up of colon polyps  . COLONOSCOPY N/A 10/10/2012   Procedure: COLONOSCOPY;  Surgeon: Gatha Mayer, MD;  Location: [facility];  Service: Endoscopy;  Laterality: N/A;  Need APC  . COLONOSCOPY    . COLONOSCOPY W/ POLYPECTOMY  01/13/11   3 polyps, largest 3 cm cecal polyp - adenomas  . HOT HEMOSTASIS  12/14/2011   Procedure: HOT HEMOSTASIS (ARGON PLASMA COAGULATION/BICAP);  Surgeon: Gatha Mayer, MD;  Location: [facility];  Service: Endoscopy;  Laterality: N/A;  . POLYPECTOMY      Family History  Problem Relation Age of Onset  . Colon cancer Mother 68  . Heart disease Mother   . Heart disease Father   . Cancer - Other Daughter        glioblastoma- died age 62   . Obesity Neg Hx   . Sleep apnea Neg Hx   . Stroke Neg Hx   . Heart attack Neg Hx   . Colon polyps Neg Hx   . Rectal cancer Neg Hx   . Stomach cancer Neg Hx   . Esophageal cancer Neg Hx     Social History:  reports that he has never smoked. He has never used smokeless tobacco. He reports that he drinks alcohol. He reports that he does not use drugs.    Review of Systems       Lipids: Has been on Lipitor for about 10 years for hypercholesterolemia, Also followed by PCP. LDL is controlled but his HDL has been  low  Lab Results  Component Value Date   CHOL 108 12/22/2016   HDL 29.70 (L) 12/22/2016   LDLCALC 62 12/22/2016   TRIG 79.0 12/22/2016   CHOLHDL 4 12/22/2016           Has history of sleep apnea  HYPERTENSION: He is on Diovan 160 mg daily.   Blood pressure Fairly good today Blood pressure may be  better with his not taking the 15 mg meloxicam regularly as he was doing previously  BP Readings from Last 3 Encounters:  12/27/16 133/74  08/30/16 (!) 144/68  07/13/16 (!) 166/67   He says 7.5 mg meloxicam helps his knee pain, not taking daily  Last foot exam in 7/17  Physical Examination:  BP 133/74   Pulse 73   Ht 6' (1.829 m)   Wt 289 lb 9.6 oz (131.4 kg)   SpO2 96%   BMI 39.28 kg/m   ASSESSMENT:  Diabetes type 2, With obesity   He has had type II diabetes with obesity, overall mild  See history of present illness for detailed discussion of current diabetes management, blood sugar patterns and problems identified His A1c is below 6.5 with metformin and Trulicity  As before he still has difficulty with weight management  He is not able to exercise Also not able to control portions or snacks even with taking Trulicity now Monitoring blood sugar very sporadically  HYPERTENSION: His blood pressure is better now Advised him to take meloxicam only as needed   PLAN:   Discussed how to use Ozempic and showed him how to inject with the pen device He will use 0.5 mg weekly When finished he will call for a new prescription, given him co-pay card If he is not improving with satiety or starting to lose weight he'll go back to Trulicity More consistent glucose monitoring Start exercise when able to   Patient Instructions  Check blood sugars on waking up 2/7   Also check blood sugars about 2 hours after a meal and do this after different meals by rotation  Recommended blood sugar levels on waking up is 90-130 and about 2 hours after meal is 130-160  Please bring your blood sugar monitor to each visit, thank you  Ozempic 0.5mg  weekly  Exercise, watch calories!     Mark White 12/27/2016, 9:07 PM

## 2016-12-27 NOTE — Patient Instructions (Addendum)
Check blood sugars on waking up 2/7   Also check blood sugars about 2 hours after a meal and do this after different meals by rotation  Recommended blood sugar levels on waking up is 90-130 and about 2 hours after meal is 130-160  Please bring your blood sugar monitor to each visit, thank you  Ozempic 0.5mg  weekly  Exercise, watch calories!

## 2017-02-25 ENCOUNTER — Other Ambulatory Visit: Payer: Self-pay | Admitting: Endocrinology

## 2017-03-23 ENCOUNTER — Other Ambulatory Visit (INDEPENDENT_AMBULATORY_CARE_PROVIDER_SITE_OTHER): Payer: BLUE CROSS/BLUE SHIELD

## 2017-03-23 DIAGNOSIS — E1165 Type 2 diabetes mellitus with hyperglycemia: Secondary | ICD-10-CM | POA: Diagnosis not present

## 2017-03-23 LAB — BASIC METABOLIC PANEL
BUN: 19 mg/dL (ref 6–23)
CHLORIDE: 106 meq/L (ref 96–112)
CO2: 26 mEq/L (ref 19–32)
CREATININE: 1.1 mg/dL (ref 0.40–1.50)
Calcium: 9.6 mg/dL (ref 8.4–10.5)
GFR: 72.17 mL/min (ref >60.00)
Glucose, Bld: 140 mg/dL — ABNORMAL HIGH (ref 70–99)
POTASSIUM: 4.1 meq/L (ref 3.5–5.1)
Sodium: 139 mEq/L (ref 135–145)

## 2017-03-23 LAB — HEMOGLOBIN A1C: HEMOGLOBIN A1C: 7.1 % — AB (ref 4.6–6.5)

## 2017-03-29 ENCOUNTER — Ambulatory Visit (INDEPENDENT_AMBULATORY_CARE_PROVIDER_SITE_OTHER): Payer: BLUE CROSS/BLUE SHIELD | Admitting: Endocrinology

## 2017-03-29 ENCOUNTER — Encounter: Payer: Self-pay | Admitting: Endocrinology

## 2017-03-29 VITALS — BP 128/68 | HR 71 | Ht 72.0 in | Wt 294.4 lb

## 2017-03-29 DIAGNOSIS — E1165 Type 2 diabetes mellitus with hyperglycemia: Secondary | ICD-10-CM | POA: Diagnosis not present

## 2017-03-29 MED ORDER — SEMAGLUTIDE(0.25 OR 0.5MG/DOS) 2 MG/1.5ML ~~LOC~~ SOPN: 0.5000 mg | PEN_INJECTOR | SUBCUTANEOUS | 2 refills | Status: DC

## 2017-03-29 NOTE — Progress Notes (Signed)
Patient ID: Mark White, male   DOB: 06-29-1956, 61 y.o.   MRN: [id number]   Reason for Appointment : Followup for Type 2 Diabetes  History of Present Illness          Diagnosis: Type 2 diabetes mellitus, date of diagnosis:   2013        Past history:  At diagnosis he was having borderline hyperglycemia. He had previously been taking metformin from his PCP. He was prescribed this twice a day but would tend to forget and take it once a day.  His A1c in early 2014 was 7.2   Because of his relatively high A1c of 7.5 and obesity he was started on Trulicity 3.29 mg weekly in 06/2013 Previously in 09/2014 he had made substantial changes in his diet and lost over 20 pounds His A1c with this was also much better than usual at 5.8  Recent history:   Non-insulin hypoglycemic drugs the patient is taking are: Metformin ER 750 mg, 2 tablets daily at dinnertime  His A1c has been as low as 6.2 when he takes Trulicity and watches his diet  It is now up to 7.1  He was given OZEMPIC on his last visit in May but he claims that he lost the sample pen and did not call about this  Current blood sugar patterns and problems identified:  He has checked a few blood sugars at home and these are usually mildly increased with higher readings as much is 272 when he eats more carbohydrate or foods like biscuits in the morning  Otherwise his readings in the mornings are mostly below 130, checked very sporadically  not been able to take any weight off on this visit  Because of knee problem he has not done any walking again for exercise, he thinks she can walk only up to half a mile   Side effects from medications have been: None      Compliance with the medical regimen:  Fair   Glucose monitoring:  done sporadically       Glucometer:    One Touch   Blood Glucose readings: As above  Self-care: The diet that the patient has been following is: No specific diet Diet history:  Typical breakfast is  an egg, [place] sausage with  bread but sometimes a fruit bowl Periodically eating out  Physical activity: exercise:   Minimal      Dietician visit: Most recent: 5/14, does not want to pay for this again.      Weight history: Usually ranging from 290-305  Wt Readings from Last 3 Encounters:  03/29/17 294 lb 6.4 oz (133.5 kg)  12/27/16 289 lb 9.6 oz (131.4 kg)  08/30/16 289 lb (131.1 kg)    Diabetes control:    Lab Results  Component Value Date   HGBA1C 7.1 (H) 03/23/2017   HGBA1C 6.4 12/22/2016   HGBA1C 6.2 08/11/2016   Lab Results  Component Value Date   MICROALBUR <0.7 12/22/2016   [place] 62 12/22/2016   CREATININE 1.10 03/23/2017    Lab on 03/23/2017  Component Date Value Ref Range Status  . Hgb A1c MFr Bld 03/23/2017 7.1* 4.6 - 6.5 % Final   Glycemic Control Guidelines for People with Diabetes:Non Diabetic:  <6%Goal of Therapy: <7%Additional Action Suggested:  >8%   . Sodium 03/23/2017 139  135 - 145 mEq/L Final  . Potassium 03/23/2017 4.1  3.5 - 5.1 mEq/L Final  . Chloride 03/23/2017 106  96 - 112  mEq/L Final  . CO2 03/23/2017 26  19 - 32 mEq/L Final  . Glucose, Bld 03/23/2017 140* 70 - 99 mg/dL Final  . BUN 03/23/2017 19  6 - 23 mg/dL Final  . Creatinine, Ser 03/23/2017 1.10  0.40 - 1.50 mg/dL Final  . Calcium 03/23/2017 9.6  8.4 - 10.5 mg/dL Final  . GFR 03/23/2017 72.17  >60.00 mL/min Final    Allergies as of 03/29/2017   No Known Allergies     Medication List       Accurate as of 03/29/17 10:38 AM. Always use your most recent med list.          aspirin 81 MG tablet Take 1 tablet (81 mg total) by mouth daily. STOP TAKING AND RESTART Dec 29 2011   atorvastatin 40 MG tablet Commonly known as:  LIPITOR Take 40 mg by mouth daily.   Dulaglutide 1.5 MG/0.5ML Sopn Commonly known as:  TRULICITY Inject in the skin once weekly   fenofibrate 145 MG tablet Commonly known as:  TRICOR Take 145 mg by mouth daily.   losartan 100 MG tablet Commonly known  as:  COZAAR Take by mouth.   meloxicam 15 MG tablet Commonly known as:  MOBIC as needed.   metFORMIN 750 MG 24 hr tablet Commonly known as:  GLUCOPHAGE-XR Take 2 tablets (1,500 mg total) by mouth daily with breakfast.   ONE TOUCH ULTRA TEST test strip Generic drug:  glucose blood USE TO CHECK BLOOD SUGAR ONCE A DAY DX CODE Y30.16   ONETOUCH DELICA LANCETS 01U Misc Use to check blood sugar once a day dx code E11.65   valsartan 160 MG tablet Commonly known as:  DIOVAN Take 160 mg by mouth daily.       Allergies: No Known Allergies  Past Medical History:  Diagnosis Date  . Diabetes mellitus without complication ([place])   . Hyperlipidemia   . Hypertension   . Personal history of adenomatous colonic polyps 01/19/2011  . Sleep apnea    not using CPAP  . Ulcer    30 years ago    Past Surgical History:  Procedure Laterality Date  . COLONOSCOPY  12/14/2011   Procedure: COLONOSCOPY;  Surgeon: Gatha Mayer, MD;  Location: [facility];  Service: Endoscopy;  Laterality: N/A;  colon wiht APC for follow up of colon polyps  . COLONOSCOPY N/A 10/10/2012   Procedure: COLONOSCOPY;  Surgeon: Gatha Mayer, MD;  Location: [facility];  Service: Endoscopy;  Laterality: N/A;  Need APC  . COLONOSCOPY    . COLONOSCOPY W/ POLYPECTOMY  01/13/11   3 polyps, largest 3 cm cecal polyp - adenomas  . HOT HEMOSTASIS  12/14/2011   Procedure: HOT HEMOSTASIS (ARGON PLASMA COAGULATION/BICAP);  Surgeon: Gatha Mayer, MD;  Location: [facility];  Service: Endoscopy;  Laterality: N/A;  . POLYPECTOMY      Family History  Problem Relation Age of Onset  . Colon cancer Mother 44  . Heart disease Mother   . Heart disease Father   . Cancer - Other Daughter        glioblastoma- died age 3   . Obesity Neg Hx   . Sleep apnea Neg Hx   . Stroke Neg Hx   . Heart attack Neg Hx   . Colon polyps Neg Hx   . Rectal cancer Neg Hx   . Stomach cancer Neg Hx   . Esophageal cancer Neg Hx     Social History:   reports that he has never  smoked. He has never used smokeless tobacco. He reports that he drinks alcohol. He reports that he does not use drugs.    Review of Systems       Lipids: Has been on Lipitor for about 10 years for hypercholesterolemia, Also followed by PCP. LDL is controlled but his HDL has been  low  Lab Results  Component Value Date   CHOL 108 12/22/2016   HDL 29.70 (L) 12/22/2016   LDLCALC 62 12/22/2016   TRIG 79.0 12/22/2016   CHOLHDL 4 12/22/2016           Has history of sleep apnea  HYPERTENSION: He is on Diovan 160 mg daily He is about to be switched to LOSARTAN from his PCP   Blood pressure Fairly good today   BP Readings from Last 3 Encounters:  03/29/17 128/68  12/27/16 133/74  08/30/16 (!) 144/68   He says 7.5 mg meloxicam helps his knee pain, not takingMuch recently  Last foot exam in 7/17  Physical Examination:  BP 128/68   Pulse 71   Ht 6' (1.829 m)   Wt 294 lb 6.4 oz (133.5 kg)   SpO2 95%   BMI 39.93 kg/m   ASSESSMENT:  Diabetes type 2, With obesity   He has had type II diabetes with obesity  See history of present illness for detailed discussion of current diabetes management, blood sugar patterns and problems identified  He has been totally noncompliant with self-care and medications and not very concerned about his diabetes  His A1c is  7.1 He is not able to manage his weight and diet if he does not take a GLP-1 drug Although he was started on Ozempic on the last visit he did not start the sample and is now coming here for follow-up He does not consistently watch his diet but also has found out that his sugars may be well over 200 if he eats high carbohydrate/high fat foods add mealtime or snacks  HYPERTENSION: His blood pressure is currently controlled, he will follow-up with PCP in couple of weeks also   PLAN:   Discussed starting on  Ozempic and showed him how to inject with the pen device He will be given a new  prescription  He will use 0.25 mg for the first 2 weeks and then 0.5 mg  More consistentglucose monitoring At various times including after meals  Start exercise with walking, he thinks he can take meloxicam on the days he needs to walk Follow-up in 3 months He will call if he has any difficulty with the Ozempic Consistent diet   There are no Patient Instructions on file for this visit.   Debe Anfinson 03/29/2017, 10:38 AM

## 2017-03-29 NOTE — Patient Instructions (Signed)
0.25mg  for 2 weeks then 0.5

## 2017-04-04 ENCOUNTER — Other Ambulatory Visit: Payer: Self-pay | Admitting: Endocrinology

## 2017-05-23 DIAGNOSIS — H524 Presbyopia: Secondary | ICD-10-CM | POA: Diagnosis not present

## 2017-06-26 ENCOUNTER — Other Ambulatory Visit (INDEPENDENT_AMBULATORY_CARE_PROVIDER_SITE_OTHER): Payer: BLUE CROSS/BLUE SHIELD

## 2017-06-26 DIAGNOSIS — E1165 Type 2 diabetes mellitus with hyperglycemia: Secondary | ICD-10-CM

## 2017-06-26 LAB — BASIC METABOLIC PANEL
BUN: 18 mg/dL (ref 6–23)
CO2: 28 mEq/L (ref 19–32)
CREATININE: 1.1 mg/dL (ref 0.40–1.50)
Calcium: 9.8 mg/dL (ref 8.4–10.5)
Chloride: 108 mEq/L (ref 96–112)
GFR: 72.11 mL/min (ref >60.00)
GLUCOSE: 149 mg/dL — AB (ref 70–99)
Potassium: 3.9 mEq/L (ref 3.5–5.1)
Sodium: 143 mEq/L (ref 135–145)

## 2017-06-26 LAB — HEMOGLOBIN A1C: HEMOGLOBIN A1C: 6.5 % (ref 4.6–6.5)

## 2017-06-29 ENCOUNTER — Encounter: Payer: Self-pay | Admitting: Endocrinology

## 2017-06-29 ENCOUNTER — Ambulatory Visit (INDEPENDENT_AMBULATORY_CARE_PROVIDER_SITE_OTHER): Payer: BLUE CROSS/BLUE SHIELD | Admitting: Endocrinology

## 2017-06-29 VITALS — BP 130/72 | HR 75 | Ht 72.0 in | Wt 295.4 lb

## 2017-06-29 DIAGNOSIS — E1165 Type 2 diabetes mellitus with hyperglycemia: Secondary | ICD-10-CM | POA: Diagnosis not present

## 2017-06-29 MED ORDER — LORCASERIN HCL ER 20 MG PO TB24: 20.0000 mg | ORAL_TABLET | Freq: Every day | ORAL | 3 refills | Status: DC

## 2017-06-29 NOTE — Progress Notes (Signed)
Patient ID: Mark White, male   DOB: 10-Mar-1956, 61 y.o.   MRN: [id number]   Reason for Appointment : Followup for Type 2 Diabetes  History of Present Illness          Diagnosis: Type 2 diabetes mellitus, date of diagnosis:   2013        Past history:  At diagnosis he was having borderline hyperglycemia. He had previously been taking metformin from his PCP. He was prescribed this twice a day but would tend to forget and take it once a day.  His A1c in early 2014 was 7.2   Because of his relatively high A1c of 7.5 and obesity he was started on Trulicity 6.76 mg weekly in 06/2013 Previously in 09/2014 he had made substantial changes in his diet and lost over 20 pounds His A1c with this was also much better than usual at 5.8  Recent history:   Non-insulin hypoglycemic drugs the patient is taking are: Metformin ER 750 mg, 2 tablets daily at dinnertime  His A1c 6.5, has been as low as 6.2 and as high as 7.1  He was given OZEMPIC on his last visit but he is not taking this now  Current blood sugar patterns and problems identified:  He apparently did try taking his Ozempic 0.5 mg weekly and he felt that it helped his satiety for only the first month or so and then it did not help  He says that his blood sugars are high when he is overeating with meals and snacks  He stopped his Ozempic  Also has not checked his blood sugars and over a month  Previous blood sugar patterns as follows  Currently not doing much exercise because of his knee pain  Overall highest blood sugars were after breakfast and around suppertime   Side effects from medications have been: None      Compliance with the medical regimen:  Fair   Glucose monitoring:  done sporadically       Glucometer:    One Touch   Blood Glucose readings: As   Mean values apply above for all meters except median for One Touch  PRE-MEAL Fasting Lunch Dinner Bedtime Overall  Glucose range: 113-179     102-204     Mean/median: 141  139  155   147    POST-MEAL PC Breakfast PC Lunch PC Dinner  Glucose range:     Mean/median: 162        Self-care: The diet that the patient has been following is: No specific diet Diet history:  Typical breakfast is an egg, [place] sausage with  bread  Periodically eating out  Physical activity: exercise:   Minimal      Dietician visit: Most recent: 5/14, does not want to pay for this again.      Weight history: Usually ranging from 290-305  Wt Readings from Last 3 Encounters:  06/29/17 295 lb 6.4 oz (134 kg)  03/29/17 294 lb 6.4 oz (133.5 kg)  12/27/16 289 lb 9.6 oz (131.4 kg)    Diabetes control:    Lab Results  Component Value Date   HGBA1C 6.5 06/26/2017   HGBA1C 7.1 (H) 03/23/2017   HGBA1C 6.4 12/22/2016   Lab Results  Component Value Date   MICROALBUR <0.7 12/22/2016   LDLCALC 62 12/22/2016   CREATININE 1.10 06/26/2017    Lab on 06/26/2017  Component Date Value Ref Range Status  . Sodium 06/26/2017 143  135 - 145 mEq/L  Final  . Potassium 06/26/2017 3.9  3.5 - 5.1 mEq/L Final  . Chloride 06/26/2017 108  96 - 112 mEq/L Final  . CO2 06/26/2017 28  19 - 32 mEq/L Final  . Glucose, Bld 06/26/2017 149* 70 - 99 mg/dL Final  . BUN 06/26/2017 18  6 - 23 mg/dL Final  . Creatinine, Ser 06/26/2017 1.10  0.40 - 1.50 mg/dL Final  . Calcium 06/26/2017 9.8  8.4 - 10.5 mg/dL Final  . GFR 06/26/2017 72.11  >60.00 mL/min Final  . Hgb A1c MFr Bld 06/26/2017 6.5  4.6 - 6.5 % Final   Glycemic Control Guidelines for People with Diabetes:Non Diabetic:  <6%Goal of Therapy: <7%Additional Action Suggested:  >8%     Allergies as of 06/29/2017   No Known Allergies     Medication List        Accurate as of 06/29/17 11:01 AM. Always use your most recent med list.          aspirin 81 MG tablet Take 1 tablet (81 mg total) by mouth daily. STOP TAKING AND RESTART Dec 29 2011   atorvastatin 40 MG tablet Commonly known as:  LIPITOR Take 40 mg by mouth  daily.   fenofibrate 145 MG tablet Commonly known as:  TRICOR Take 145 mg by mouth daily.   losartan 100 MG tablet Commonly known as:  COZAAR Take by mouth.   meloxicam 15 MG tablet Commonly known as:  MOBIC as needed.   metFORMIN 750 MG 24 hr tablet Commonly known as:  GLUCOPHAGE-XR Take 2 tablets (1,500 mg total) by mouth daily with breakfast.   ONE TOUCH ULTRA TEST test strip Generic drug:  glucose blood USE TO CHECK BLOOD SUGAR ONCE A DAY DX CODE N82.95   ONETOUCH DELICA LANCETS 62Z Misc USE TO CHECK BLOOD SUGAR ONCE A DAY DX CODE E11.65   Semaglutide 0.25 or 0.5 MG/DOSE Sopn Commonly known as:  OZEMPIC Inject 0.5 mg into the skin once a week.       Allergies: No Known Allergies  Past Medical History:  Diagnosis Date  . Diabetes mellitus without complication ([place])   . Hyperlipidemia   . Hypertension   . Personal history of adenomatous colonic polyps 01/19/2011  . Sleep apnea    not using CPAP  . Ulcer    30 years ago    Past Surgical History:  Procedure Laterality Date  . COLONOSCOPY  12/14/2011   Procedure: COLONOSCOPY;  Surgeon: Gatha Mayer, MD;  Location: [facility];  Service: Endoscopy;  Laterality: N/A;  colon wiht APC for follow up of colon polyps  . COLONOSCOPY N/A 10/10/2012   Procedure: COLONOSCOPY;  Surgeon: Gatha Mayer, MD;  Location: [facility];  Service: Endoscopy;  Laterality: N/A;  Need APC  . COLONOSCOPY    . COLONOSCOPY W/ POLYPECTOMY  01/13/11   3 polyps, largest 3 cm cecal polyp - adenomas  . HOT HEMOSTASIS  12/14/2011   Procedure: HOT HEMOSTASIS (ARGON PLASMA COAGULATION/BICAP);  Surgeon: Gatha Mayer, MD;  Location: [facility];  Service: Endoscopy;  Laterality: N/A;  . POLYPECTOMY      Family History  Problem Relation Age of Onset  . Colon cancer Mother 73  . Heart disease Mother   . Heart disease Father   . Cancer - Other Daughter        glioblastoma- died age 58   . Obesity Neg Hx   . Sleep apnea Neg Hx   .  Stroke Neg Hx   .  Heart attack Neg Hx   . Colon polyps Neg Hx   . Rectal cancer Neg Hx   . Stomach cancer Neg Hx   . Esophageal cancer Neg Hx     Social History:  reports that  has never smoked. he has never used smokeless tobacco. He reports that he drinks alcohol. He reports that he does not use drugs.    Review of Systems       Lipids: Has been on Lipitor for over 10 years for hypercholesterolemia, Also followed by PCP. LDL is controlled but his HDL has been  low  Lab Results  Component Value Date   CHOL 108 12/22/2016   HDL 29.70 (L) 12/22/2016   LDLCALC 62 12/22/2016   TRIG 79.0 12/22/2016   CHOLHDL 4 12/22/2016           Has history of sleep apnea  HYPERTENSION: He is on losartan 100 mg daily from his PCP   Blood pressure Fairly good    BP Readings from Last 3 Encounters:  06/29/17 130/72  03/29/17 128/68  12/27/16 133/74   He says 7.5 mg meloxicam helps his knee pain, taking as needed  Last foot exam in 7/17  Physical Examination:  BP 130/72   Pulse 75   Ht 6' (1.829 m)   Wt 295 lb 6.4 oz (134 kg)   SpO2 98%   BMI 40.06 kg/m   ASSESSMENT:  Diabetes type 2, With obesity    See history of present illness for discussion of current diabetes management, blood sugar patterns and problems identified  He has been irregular with his treatment plan especially Trulicity or Ozempic and unable to control his blood sugars mostly because of poor diet  His A1c is  6.5 and may have helped for him to take Ozempic which he has stopped He thinks that he wants a medication that would help him with satiety and portion control which would help his weight loss also He has not done much exercise    HYPERTENSION: His blood pressure is currently controlled,   PLAN:   Discussed using a weight-loss drug to help him with portion control, weight loss and improved blood sugar control Given him information on Belviq along with the patient weight management program  associated with the drug Discussed possible side effects and need to let us know if it is not effective Patient co-pay card given He will continue metformin Needs to check blood sugars again periodically both fasting and after meals Encouraged him to start more exercise    There are no Patient Instructions on file for this visit.   Constant Mandeville 06/29/2017, 11:01 AM

## 2017-07-12 DIAGNOSIS — Z1159 Encounter for screening for other viral diseases: Secondary | ICD-10-CM | POA: Diagnosis not present

## 2017-07-12 DIAGNOSIS — E119 Type 2 diabetes mellitus without complications: Secondary | ICD-10-CM | POA: Diagnosis not present

## 2017-07-12 DIAGNOSIS — E1159 Type 2 diabetes mellitus with other circulatory complications: Secondary | ICD-10-CM | POA: Diagnosis not present

## 2017-07-12 DIAGNOSIS — I10 Essential (primary) hypertension: Secondary | ICD-10-CM | POA: Diagnosis not present

## 2017-07-12 DIAGNOSIS — Z23 Encounter for immunization: Secondary | ICD-10-CM | POA: Diagnosis not present

## 2017-07-12 DIAGNOSIS — E785 Hyperlipidemia, unspecified: Secondary | ICD-10-CM | POA: Diagnosis not present

## 2017-09-22 ENCOUNTER — Other Ambulatory Visit (INDEPENDENT_AMBULATORY_CARE_PROVIDER_SITE_OTHER): Payer: BLUE CROSS/BLUE SHIELD

## 2017-09-22 DIAGNOSIS — E1165 Type 2 diabetes mellitus with hyperglycemia: Secondary | ICD-10-CM

## 2017-09-22 LAB — COMPREHENSIVE METABOLIC PANEL
ALT: 31 U/L (ref 0–53)
AST: 18 U/L (ref 0–37)
Albumin: 4.2 g/dL (ref 3.5–5.2)
Alkaline Phosphatase: 60 U/L (ref 39–117)
BILIRUBIN TOTAL: 0.7 mg/dL (ref 0.2–1.2)
BUN: 18 mg/dL (ref 6–23)
CALCIUM: 9.8 mg/dL (ref 8.4–10.5)
CO2: 27 meq/L (ref 19–32)
CREATININE: 1.07 mg/dL (ref 0.40–1.50)
Chloride: 106 mEq/L (ref 96–112)
GFR: 74.39 mL/min (ref >60.00)
Glucose, Bld: 136 mg/dL — ABNORMAL HIGH (ref 70–99)
Potassium: 4.1 mEq/L (ref 3.5–5.1)
Sodium: 141 mEq/L (ref 135–145)
TOTAL PROTEIN: 6.7 g/dL (ref 6.0–8.3)

## 2017-09-22 LAB — LIPID PANEL
CHOL/HDL RATIO: 5
CHOLESTEROL: 131 mg/dL (ref 0–200)
HDL: 27.9 mg/dL — ABNORMAL LOW (ref >39.00)
LDL Cholesterol: 79 mg/dL (ref 0–99)
NonHDL: 103.48
TRIGLYCERIDES: 121 mg/dL (ref 0.0–149.0)
VLDL: 24.2 mg/dL (ref 0.0–40.0)

## 2017-09-22 LAB — HEMOGLOBIN A1C: Hgb A1c MFr Bld: 7.4 % — ABNORMAL HIGH (ref 4.6–6.5)

## 2017-09-26 NOTE — Progress Notes (Signed)
Patient ID: Mark White, male   DOB: 1955-12-12, 62 y.o.   MRN: [id number]   Reason for Appointment : Followup for Type 2 Diabetes  History of Present Illness          Diagnosis: Type 2 diabetes mellitus, date of diagnosis:   2013        Past history:  At diagnosis he was having borderline hyperglycemia. He had previously been taking metformin from his PCP. He was prescribed this twice a day but would tend to forget and take it once a day.  His A1c in early 2014 was 7.2   Because of his relatively high A1c of 7.5 and obesity he was started on Trulicity 8.09 mg weekly in 06/2013 Previously in 09/2014 he had made substantial changes in his diet and lost over 20 pounds His A1c with this was also much better than usual at 5.8  Recent history:   Non-insulin hypoglycemic drugs the patient is taking are: Metformin ER 750 mg, 2 tablets daily at dinnertime  His A1c 7.4, higher than usual  Current blood sugar patterns and problems identified:  He was told to try Belviq since his main difficulty with his control and obesity was not watching his diet  Also he didn't have consistent improvement with taking Ozempic 0.5 mg after the first couple of injections  Since Belviq was not covered he has not taken any replacement and he did not request to go back on Ozempic  He also has been traveling a lot and not watching his diet  He thinks that previously even with checking his blood sugars it did not help him do better with his diet as he did not find any particular pattern of foods  However he may have done better with writing down what he was eating  Currently not doing much exercise because of his knee pain  Has not checked his blood sugars also, does have test strips at home  Mostly was be having higher postprandial readings since his fasting glucose in the lab was only 136   Side effects from medications have been: None      Compliance with the medical regimen:  Fair,  recently worse   Glucose monitoring:  done sporadically       Glucometer:    One Touch   Blood Glucose readings: None     Self-care: The diet that the patient has been following is: No specific diet Diet history:  Typical breakfast is an egg, [place] sausage with  bread   Periodically eating outEspecially when traveling  Physical activity: exercise:   Minimal      Dietician visit: Most recent: 5/14, does not want to pay for this again.      Weight history: Usually ranging from 290-305  Wt Readings from Last 3 Encounters:  09/27/17 (!) 301 lb 3.2 oz (136.6 kg)  06/29/17 295 lb 6.4 oz (134 kg)  03/29/17 294 lb 6.4 oz (133.5 kg)    Diabetes control:    Lab Results  Component Value Date   HGBA1C 7.4 (H) 09/22/2017   HGBA1C 6.5 06/26/2017   HGBA1C 7.1 (H) 03/23/2017   Lab Results  Component Value Date   MICROALBUR <0.7 12/22/2016   LDLCALC 79 09/22/2017   CREATININE 1.07 09/22/2017    Lab on 09/22/2017  Component Date Value Ref Range Status  . Cholesterol 09/22/2017 131  0 - 200 mg/dL Final   ATP III Classification       Desirable:  <  200 mg/dL               Borderline High:  200 - 239 mg/dL          High:  > = 240 mg/dL  . Triglycerides 09/22/2017 121.0  0.0 - 149.0 mg/dL Final   Normal:  <150 mg/dLBorderline High:  150 - 199 mg/dL  . HDL 09/22/2017 27.90* >39.00 mg/dL Final  . VLDL 09/22/2017 24.2  0.0 - 40.0 mg/dL Final  . LDL Cholesterol 09/22/2017 79  0 - 99 mg/dL Final  . Total CHOL/HDL Ratio 09/22/2017 5   Final                  Men          Women1/2 Average Risk     3.4          3.3Average Risk          5.0          4.42X Average Risk          9.6          7.13X Average Risk          15.0          11.0                      . NonHDL 09/22/2017 103.48   Final   NOTE:  Non-HDL goal should be 30 mg/dL higher than patient's LDL goal (i.e. LDL goal of < 70 mg/dL, would have non-HDL goal of < 100 mg/dL)  . Sodium 09/22/2017 141  135 - 145 mEq/L Final  . Potassium  09/22/2017 4.1  3.5 - 5.1 mEq/L Final  . Chloride 09/22/2017 106  96 - 112 mEq/L Final  . CO2 09/22/2017 27  19 - 32 mEq/L Final  . Glucose, Bld 09/22/2017 136* 70 - 99 mg/dL Final  . BUN 09/22/2017 18  6 - 23 mg/dL Final  . Creatinine, Ser 09/22/2017 1.07  0.40 - 1.50 mg/dL Final  . Total Bilirubin 09/22/2017 0.7  0.2 - 1.2 mg/dL Final  . Alkaline Phosphatase 09/22/2017 60  39 - 117 U/L Final  . AST 09/22/2017 18  0 - 37 U/L Final  . ALT 09/22/2017 31  0 - 53 U/L Final  . Total Protein 09/22/2017 6.7  6.0 - 8.3 g/dL Final  . Albumin 09/22/2017 4.2  3.5 - 5.2 g/dL Final  . Calcium 09/22/2017 9.8  8.4 - 10.5 mg/dL Final  . GFR 09/22/2017 74.39  >60.00 mL/min Final  . Hgb A1c MFr Bld 09/22/2017 7.4* 4.6 - 6.5 % Final   Glycemic Control Guidelines for People with Diabetes:Non Diabetic:  <6%Goal of Therapy: <7%Additional Action Suggested:  >8%     Allergies as of 09/27/2017   No Known Allergies     Medication List        Accurate as of 09/27/17 10:24 AM. Always use your most recent med list.          aspirin 81 MG tablet Take 1 tablet (81 mg total) by mouth daily. STOP TAKING AND RESTART Dec 29 2011   atorvastatin 40 MG tablet Commonly known as:  LIPITOR Take 40 mg by mouth daily.   fenofibrate 145 MG tablet Commonly known as:  TRICOR Take 145 mg by mouth daily.   Lorcaserin HCl ER 20 MG Tb24 Commonly known as:  BELVIQ XR Take 20 mg by mouth daily.   losartan 100 MG tablet Commonly known  as:  COZAAR Take by mouth.   meloxicam 15 MG tablet Commonly known as:  MOBIC as needed.   metFORMIN 750 MG 24 hr tablet Commonly known as:  GLUCOPHAGE-XR Take 2 tablets (1,500 mg total) by mouth daily with breakfast.   ONE TOUCH ULTRA TEST test strip Generic drug:  glucose blood USE TO CHECK BLOOD SUGAR ONCE A DAY DX CODE G83.66   ONETOUCH DELICA LANCETS 29U Misc USE TO CHECK BLOOD SUGAR ONCE A DAY DX CODE E11.65       Allergies: No Known Allergies  Past Medical  History:  Diagnosis Date  . Diabetes mellitus without complication ([place])   . Hyperlipidemia   . Hypertension   . Personal history of adenomatous colonic polyps 01/19/2011  . Sleep apnea    not using CPAP  . Ulcer    30 years ago    Past Surgical History:  Procedure Laterality Date  . COLONOSCOPY  12/14/2011   Procedure: COLONOSCOPY;  Surgeon: Gatha Mayer, MD;  Location: [facility];  Service: Endoscopy;  Laterality: N/A;  colon wiht APC for follow up of colon polyps  . COLONOSCOPY N/A 10/10/2012   Procedure: COLONOSCOPY;  Surgeon: Gatha Mayer, MD;  Location: [facility];  Service: Endoscopy;  Laterality: N/A;  Need APC  . COLONOSCOPY    . COLONOSCOPY W/ POLYPECTOMY  01/13/11   3 polyps, largest 3 cm cecal polyp - adenomas  . HOT HEMOSTASIS  12/14/2011   Procedure: HOT HEMOSTASIS (ARGON PLASMA COAGULATION/BICAP);  Surgeon: Gatha Mayer, MD;  Location: [facility];  Service: Endoscopy;  Laterality: N/A;  . POLYPECTOMY      Family History  Problem Relation Age of Onset  . Colon cancer Mother 36  . Heart disease Mother   . Heart disease Father   . Cancer - Other Daughter        glioblastoma- died age 72   . Obesity Neg Hx   . Sleep apnea Neg Hx   . Stroke Neg Hx   . Heart attack Neg Hx   . Colon polyps Neg Hx   . Rectal cancer Neg Hx   . Stomach cancer Neg Hx   . Esophageal cancer Neg Hx     Social History:  reports that  has never smoked. he has never used smokeless tobacco. He reports that he drinks alcohol. He reports that he does not use drugs.    Review of Systems       Lipids: Has been on Lipitor for over 10 years for hypercholesterolemia, Also followed by PCP. LDL is controlled but his HDL has been persistently  low  Lab Results  Component Value Date   CHOL 131 09/22/2017   HDL 27.90 (L) 09/22/2017   LDLCALC 79 09/22/2017   TRIG 121.0 09/22/2017   CHOLHDL 5 09/22/2017           Has history of sleep apnea  HYPERTENSION: He is on losartan 100 mg  daily from his PCP   Blood pressure is good    BP Readings from Last 3 Encounters:  09/27/17 132/80  06/29/17 130/72  03/29/17 128/68   He says 7.5 mg meloxicam helps his knee pain, taking as needed  Last foot exam in 2/19   Physical Examination:  BP 132/80   Pulse 69   Ht 6' (1.829 m)   Wt (!) 301 lb 3.2 oz (136.6 kg)   SpO2 94%   BMI 40.85 kg/m   Diabetic Foot Exam - Simple   Simple  Foot Form Diabetic Foot exam was performed with the following findings:  Yes   Visual Inspection No deformities, no ulcerations, no other skin breakdown bilaterally:  Yes Sensation Testing Intact to touch and monofilament testing bilaterally:  Yes Pulse Check Posterior Tibialis and Dorsalis pulse intact bilaterally:  Yes Comments     ASSESSMENT:  Diabetes type 2, With obesity    See history of present illness for discussion of current diabetes management, blood sugar patterns and problems identified  He has generally been noncompliant with all measures of self-care including diet, exercise and monitoring He did not let us know that he was not taking Belviq and with not taking the Ozempic his blood sugars are overall poorly controlled now He does need to be taking a GLP-1 drug for improvement in his blood sugars and obesity He thinks he will do better with Trulicity   HYPERTENSION: His blood pressure is controlled,Followed by PCP  Hyperlipidemia: LDL is well controlled but he does need to lose weight to improve his HDL   PLAN:   Trulicity 1.5 mg weekly  He will continue metformin Needs to check blood sugarsespecially after meals Encouraged him to start more exercise And he will try to get back on his exercise bike she has instead of walking which she cannot tolerate   There are no Patient Instructions on file for this visit.   Elayne Snare 09/27/2017, 10:24 AM

## 2017-09-27 ENCOUNTER — Encounter: Payer: Self-pay | Admitting: Endocrinology

## 2017-09-27 ENCOUNTER — Ambulatory Visit (INDEPENDENT_AMBULATORY_CARE_PROVIDER_SITE_OTHER): Payer: BLUE CROSS/BLUE SHIELD | Admitting: Endocrinology

## 2017-09-27 VITALS — BP 132/80 | HR 69 | Ht 72.0 in | Wt 301.2 lb

## 2017-09-27 DIAGNOSIS — E1165 Type 2 diabetes mellitus with hyperglycemia: Secondary | ICD-10-CM

## 2017-09-27 MED ORDER — DULAGLUTIDE 1.5 MG/0.5ML ~~LOC~~ SOAJ: SUBCUTANEOUS | 1 refills | Status: DC

## 2017-09-27 NOTE — Patient Instructions (Addendum)
Check blood sugars on waking up  1/7  Also check blood sugars about 2 hours after a meal and do this after different meals by rotation  Recommended blood sugar levels on waking up is 90-130 and about 2 hours after meal is 130-160  Please bring your blood sugar monitor to each visit, thank you  

## 2017-11-10 DIAGNOSIS — I10 Essential (primary) hypertension: Secondary | ICD-10-CM | POA: Diagnosis not present

## 2017-11-10 DIAGNOSIS — E785 Hyperlipidemia, unspecified: Secondary | ICD-10-CM | POA: Diagnosis not present

## 2017-11-10 DIAGNOSIS — E1159 Type 2 diabetes mellitus with other circulatory complications: Secondary | ICD-10-CM | POA: Diagnosis not present

## 2017-11-10 DIAGNOSIS — E1169 Type 2 diabetes mellitus with other specified complication: Secondary | ICD-10-CM | POA: Diagnosis not present

## 2017-11-23 ENCOUNTER — Other Ambulatory Visit: Payer: Self-pay | Admitting: Endocrinology

## 2017-12-22 ENCOUNTER — Other Ambulatory Visit (INDEPENDENT_AMBULATORY_CARE_PROVIDER_SITE_OTHER): Payer: BLUE CROSS/BLUE SHIELD

## 2017-12-22 DIAGNOSIS — E1165 Type 2 diabetes mellitus with hyperglycemia: Secondary | ICD-10-CM

## 2017-12-22 LAB — BASIC METABOLIC PANEL
BUN: 19 mg/dL (ref 6–23)
CALCIUM: 9.9 mg/dL (ref 8.4–10.5)
CO2: 27 meq/L (ref 19–32)
CREATININE: 1.08 mg/dL (ref 0.40–1.50)
Chloride: 106 mEq/L (ref 96–112)
GFR: 73.54 mL/min (ref >60.00)
GLUCOSE: 204 mg/dL — AB (ref 70–99)
Potassium: 4.2 mEq/L (ref 3.5–5.1)
SODIUM: 140 meq/L (ref 135–145)

## 2017-12-22 LAB — HEMOGLOBIN A1C: Hgb A1c MFr Bld: 7.4 % — ABNORMAL HIGH (ref 4.6–6.5)

## 2017-12-27 ENCOUNTER — Encounter: Payer: Self-pay | Admitting: Endocrinology

## 2017-12-27 ENCOUNTER — Ambulatory Visit: Payer: BLUE CROSS/BLUE SHIELD | Admitting: Endocrinology

## 2017-12-27 VITALS — BP 134/78 | HR 74 | Ht 72.0 in | Wt 304.6 lb

## 2017-12-27 DIAGNOSIS — E1165 Type 2 diabetes mellitus with hyperglycemia: Secondary | ICD-10-CM | POA: Diagnosis not present

## 2017-12-27 NOTE — Progress Notes (Signed)
Patient ID: Mark White, male   DOB: 02/10/1956, 62 y.o.   MRN: [id number]   Reason for Appointment : Followup for Type 2 Diabetes  History of Present Illness          Diagnosis: Type 2 diabetes mellitus, date of diagnosis:   2013        Past history:  At diagnosis he was having borderline hyperglycemia. He had previously been taking metformin from his PCP. He was prescribed this twice a day but would tend to forget and take it once a day.  His A1c in early 2014 was 7.2   Because of his relatively high A1c of 7.5 and obesity he was started on Trulicity 2.70 mg weekly in 06/2013 Previously in 09/2014 he had made substantial changes in his diet and lost over 20 pounds His A1c with this was also much better than usual at 5.8  Recent history:   Non-insulin hypoglycemic drugs the patient is taking are: Metformin ER 750 mg, 2 tablets daily at dinnertime  His A1c 7.4 again  Current blood sugar patterns and problems identified:  He was told to restart his Trulicity after his visit in February and he took this only for a month  After that he did not remember to take it and his blood sugars are back up  He has not had any consistent improvement in his diet and continues to gain weight slowly  Although he is trying to be a little more active he is not doing much formal exercise  He has not checked his sugars much except the last week or so and most of his high readings were from eating large breakfast meals   Side effects from medications have been: None      Compliance with the medical regimen:  Fair, recently worse   Glucose monitoring:  done sporadically       Glucometer:    One Touch ultra mini Blood Glucose readings:   Recent range 159-271 with only one good reading at suppertime yesterday, other readings mostly after breakfast or lunch Average of 7 readings = 228   Self-care: The diet that the patient has been following is: No specific diet Diet history:  Typical  breakfast is an egg, [place] sausage with  bread   Periodically eating out including when traveling  Physical activity: exercise:  some walking      Dietician visit: Most recent: 5/14, does not want to pay for this again.      Weight history: Usually ranging from 290-305  Wt Readings from Last 3 Encounters:  12/27/17 (!) 304 lb 9.6 oz (138.2 kg)  09/27/17 (!) 301 lb 3.2 oz (136.6 kg)  06/29/17 295 lb 6.4 oz (134 kg)    Diabetes control:    Lab Results  Component Value Date   HGBA1C 7.4 (H) 12/22/2017   HGBA1C 7.4 (H) 09/22/2017   HGBA1C 6.5 06/26/2017   Lab Results  Component Value Date   MICROALBUR <0.7 12/22/2016   LDLCALC 79 09/22/2017   CREATININE 1.08 12/22/2017    Lab on 12/22/2017  Component Date Value Ref Range Status  . Sodium 12/22/2017 140  135 - 145 mEq/L Final  . Potassium 12/22/2017 4.2  3.5 - 5.1 mEq/L Final  . Chloride 12/22/2017 106  96 - 112 mEq/L Final  . CO2 12/22/2017 27  19 - 32 mEq/L Final  . Glucose, Bld 12/22/2017 204* 70 - 99 mg/dL Final  . BUN 12/22/2017 19  6 - 23  mg/dL Final  . Creatinine, Ser 12/22/2017 1.08  0.40 - 1.50 mg/dL Final  . Calcium 12/22/2017 9.9  8.4 - 10.5 mg/dL Final  . GFR 12/22/2017 73.54  >60.00 mL/min Final  . Hgb A1c MFr Bld 12/22/2017 7.4* 4.6 - 6.5 % Final   Glycemic Control Guidelines for People with Diabetes:Non Diabetic:  <6%Goal of Therapy: <7%Additional Action Suggested:  >8%     Allergies as of 12/27/2017   No Known Allergies     Medication List        Accurate as of 12/27/17 10:52 AM. Always use your most recent med list.          aspirin 81 MG tablet Take 1 tablet (81 mg total) by mouth daily. STOP TAKING AND RESTART Dec 29 2011   atorvastatin 40 MG tablet Commonly known as:  LIPITOR Take 40 mg by mouth daily.   fenofibrate 145 MG tablet Commonly known as:  TRICOR Take 145 mg by mouth daily.   losartan 100 MG tablet Commonly known as:  COZAAR Take by mouth.   meloxicam 15 MG  tablet Commonly known as:  MOBIC as needed.   metFORMIN 750 MG 24 hr tablet Commonly known as:  GLUCOPHAGE-XR Take 2 tablets (1,500 mg total) by mouth daily with breakfast.   ONE TOUCH ULTRA TEST test strip Generic drug:  glucose blood USE TO CHECK BLOOD SUGAR ONCE A DAY DX CODE M19.62   ONETOUCH DELICA LANCETS 22L Misc USE TO CHECK BLOOD SUGAR ONCE A DAY DX CODE N98.92   TRULICITY 1.5 JJ/9.4RD Sopn Generic drug:  Dulaglutide INJECT 1.5 MG WEEKLY       Allergies: No Known Allergies  Past Medical History:  Diagnosis Date  . Diabetes mellitus without complication ([place])   . Hyperlipidemia   . Hypertension   . Personal history of adenomatous colonic polyps 01/19/2011  . Sleep apnea    not using CPAP  . Ulcer    30 years ago    Past Surgical History:  Procedure Laterality Date  . COLONOSCOPY  12/14/2011   Procedure: COLONOSCOPY;  Surgeon: Gatha Mayer, MD;  Location: [facility];  Service: Endoscopy;  Laterality: N/A;  colon wiht APC for follow up of colon polyps  . COLONOSCOPY N/A 10/10/2012   Procedure: COLONOSCOPY;  Surgeon: Gatha Mayer, MD;  Location: [facility];  Service: Endoscopy;  Laterality: N/A;  Need APC  . COLONOSCOPY    . COLONOSCOPY W/ POLYPECTOMY  01/13/11   3 polyps, largest 3 cm cecal polyp - adenomas  . HOT HEMOSTASIS  12/14/2011   Procedure: HOT HEMOSTASIS (ARGON PLASMA COAGULATION/BICAP);  Surgeon: Gatha Mayer, MD;  Location: [facility];  Service: Endoscopy;  Laterality: N/A;  . POLYPECTOMY      Family History  Problem Relation Age of Onset  . Colon cancer Mother 37  . Heart disease Mother   . Heart disease Father   . Cancer - Other Daughter        glioblastoma- died age 96   . Obesity Neg Hx   . Sleep apnea Neg Hx   . Stroke Neg Hx   . Heart attack Neg Hx   . Colon polyps Neg Hx   . Rectal cancer Neg Hx   . Stomach cancer Neg Hx   . Esophageal cancer Neg Hx     Social History:  reports that he has never smoked. He has never  used smokeless tobacco. He reports that he drinks alcohol. He reports that he does not  use drugs.    Review of Systems       Lipids: Has been on Lipitor for over 10 years for hypercholesterolemia, Also followed by PCP. LDL is controlled but his HDL has been Recently has been out of this medication low, prescribed by PCP  Lab Results  Component Value Date   CHOL 131 09/22/2017   HDL 27.90 (L) 09/22/2017   LDLCALC 79 09/22/2017   TRIG 121.0 09/22/2017   CHOLHDL 5 09/22/2017           Has history of sleep apnea, not treated  HYPERTENSION: He is on losartan 100 mg daily from his PCP   Blood pressure is controlled   BP Readings from Last 3 Encounters:  12/27/17 134/78  09/27/17 132/80  06/29/17 130/72   He says 7.5 mg meloxicam helps his knee pain, taking as needed and not daily  Last foot exam in 2/19   Physical Examination:  BP 134/78 (BP Location: Left Arm, Patient Position: Sitting, Cuff Size: Normal)   Pulse 74   Ht 6' (1.829 m)   Wt (!) 304 lb 9.6 oz (138.2 kg)   SpO2 95%   BMI 41.31 kg/m    ASSESSMENT:  Diabetes type 2, With obesity    See history of present illness for discussion of current diabetes management, blood sugar patterns and problems identified  His A1c is 7.4 again His postprandial readings are as high as 271 recently  He apparently is at his maximum weight in the last few years Again he is not consistent with taking his Trulicity and says that he will start doing this now since it has previously helped him both to keep his weight down and help and follow his diet and better blood sugar control also    HYPERTENSION: His blood pressure is controlled,Followed by PCP     PLAN:   Trulicity 1.5 mg weekly to be taken regularly He will continue metformin 1500 mg for now Start checking blood sugars very regularly at least 3 times a week can bring monitor on next follow-up More regular exercise and increase walking Avoid high fat meals and  may try low carbohydrate diet He will also let us know if his insurance needs to change his blood sugar meter and to find out the covered brand   There are no Patient Instructions on file for this visit.   Elayne Snare 12/27/2017, 10:52 AM

## 2017-12-27 NOTE — Patient Instructions (Addendum)
Check brand of meter covered  Check blood sugars on waking up  2=3/7  Also check blood sugars about 2 hours after a meal and do this after different meals by rotation  Recommended blood sugar levels on waking up is 90-130 and about 2 hours after meal is 130-160  Please bring your blood sugar monitor to each visit, thank you  More exercise: ? Gym  Check coverage for dietician

## 2018-03-15 DIAGNOSIS — E1159 Type 2 diabetes mellitus with other circulatory complications: Secondary | ICD-10-CM | POA: Diagnosis not present

## 2018-03-15 DIAGNOSIS — I10 Essential (primary) hypertension: Secondary | ICD-10-CM | POA: Diagnosis not present

## 2018-03-15 DIAGNOSIS — E1169 Type 2 diabetes mellitus with other specified complication: Secondary | ICD-10-CM | POA: Diagnosis not present

## 2018-03-22 ENCOUNTER — Other Ambulatory Visit (INDEPENDENT_AMBULATORY_CARE_PROVIDER_SITE_OTHER): Payer: BLUE CROSS/BLUE SHIELD

## 2018-03-22 DIAGNOSIS — E1165 Type 2 diabetes mellitus with hyperglycemia: Secondary | ICD-10-CM | POA: Diagnosis not present

## 2018-03-22 LAB — COMPREHENSIVE METABOLIC PANEL
ALK PHOS: 66 U/L (ref 39–117)
ALT: 43 U/L (ref 0–53)
AST: 25 U/L (ref 0–37)
Albumin: 4.4 g/dL (ref 3.5–5.2)
BILIRUBIN TOTAL: 0.6 mg/dL (ref 0.2–1.2)
BUN: 16 mg/dL (ref 6–23)
CALCIUM: 10.8 mg/dL — AB (ref 8.4–10.5)
CO2: 28 meq/L (ref 19–32)
CREATININE: 1.14 mg/dL (ref 0.40–1.50)
Chloride: 104 mEq/L (ref 96–112)
GFR: 69.03 mL/min (ref >60.00)
Glucose, Bld: 123 mg/dL — ABNORMAL HIGH (ref 70–99)
Potassium: 4.5 mEq/L (ref 3.5–5.1)
Sodium: 139 mEq/L (ref 135–145)
TOTAL PROTEIN: 7.3 g/dL (ref 6.0–8.3)

## 2018-03-22 LAB — HEMOGLOBIN A1C: Hgb A1c MFr Bld: 7.7 % — ABNORMAL HIGH (ref 4.6–6.5)

## 2018-03-22 LAB — LIPID PANEL
CHOLESTEROL: 119 mg/dL (ref 0–200)
HDL: 28.9 mg/dL — AB (ref >39.00)
LDL CALC: 65 mg/dL (ref 0–99)
NonHDL: 89.83
TRIGLYCERIDES: 125 mg/dL (ref 0.0–149.0)
Total CHOL/HDL Ratio: 4
VLDL: 25 mg/dL (ref 0.0–40.0)

## 2018-03-22 LAB — MICROALBUMIN / CREATININE URINE RATIO
CREATININE, U: 158.3 mg/dL
Microalb Creat Ratio: 0.8 mg/g (ref 0.0–30.0)
Microalb, Ur: 1.3 mg/dL (ref 0.0–1.9)

## 2018-03-27 ENCOUNTER — Ambulatory Visit: Payer: BLUE CROSS/BLUE SHIELD | Admitting: Endocrinology

## 2018-03-27 ENCOUNTER — Encounter: Payer: Self-pay | Admitting: Endocrinology

## 2018-03-27 VITALS — BP 128/72 | HR 77 | Ht 72.0 in | Wt 300.8 lb

## 2018-03-27 DIAGNOSIS — E1165 Type 2 diabetes mellitus with hyperglycemia: Secondary | ICD-10-CM | POA: Diagnosis not present

## 2018-03-27 NOTE — Progress Notes (Signed)
Patient ID: Mark White, male   DOB: [birth year], 62 y.o.   MRN: [id number]   Reason for Appointment : Followup for Type 2 Diabetes  History of Present Illness          Diagnosis: Type 2 diabetes mellitus, date of diagnosis:   2013        Past history:  At diagnosis he was having borderline hyperglycemia. He had previously been taking metformin from his PCP. He was prescribed this twice a day but would tend to forget and take it once a day.  His A1c in early 2014 was 7.2   Because of his relatively high A1c of 7.5 and obesity he was started on Trulicity 3.32 mg weekly in 06/2013 Previously in 09/2014 he had made substantial changes in his diet and lost over 20 pounds His A1c with this was also much better than usual at 5.8  Recent history:   Non-insulin hypoglycemic drugs the patient is taking are: Metformin ER 750 mg, 2 tablets daily at dinnertime, Trulicity 1.5 mg weekly  His A1c has gone up to 7.7 from 7.4   Current blood sugar patterns and problems identified:  He was told to increase his Trulicity after his visit in 5/19 but he says he did not do so as he was traveling he did not want to get sick or or carry the pen with him when traveling  He normally does not have any significant nausea when he goes up from 0.75 up to 1.5 and not clear why he did not take this until the week before last  With this he was having significant decreased appetite and some nausea but is better after the second injection  As before he has not done any walking or other exercise because of knee pain  Blood sugar monitoring has been very sporadic also  His dietary compliance has been inconsistent one relatively high reading of 292 and blood sugars were significantly high when he was not taking Trulicity also  His weight came down only 4 pounds recently with reducing his caloric intake   Side effects from medications have been: None      Compliance with the medical regimen:  Fair,  recently worse   Glucose monitoring:  done sporadically       Glucometer:    One Touch ultra mini Blood Glucose readings:   Range 106-292 with readings mostly postprandial and only once significantly high MEDIAN 138  Self-care: The diet that the patient has been following is: No specific diet Diet history:  Typical breakfast is an egg,  with  bread   Periodically eating out including when traveling  Physical activity: exercise:  some walking      Dietician visit: Most recent: 5/14, does not want to pay for this again.      Weight history: Usually ranging from 290-305  Wt Readings from Last 3 Encounters:  03/27/18 (!) 300 lb 12.8 oz (136.4 kg)  12/27/17 (!) 304 lb 9.6 oz (138.2 kg)  09/27/17 (!) 301 lb 3.2 oz (136.6 kg)    Diabetes control:    Lab Results  Component Value Date   HGBA1C 7.7 (H) 03/22/2018   HGBA1C 7.4 (H) 12/22/2017   HGBA1C 7.4 (H) 09/22/2017   Lab Results  Component Value Date   MICROALBUR 1.3 03/22/2018   LDLCALC 65 03/22/2018   CREATININE 1.14 03/22/2018    Lab on 03/22/2018  Component Date Value Ref Range Status  . Cholesterol 03/22/2018 119  0 -  200 mg/dL Final   ATP III Classification       Desirable:  < 200 mg/dL               Borderline High:  200 - 239 mg/dL          High:  > = 240 mg/dL  . Triglycerides 03/22/2018 125.0  0.0 - 149.0 mg/dL Final   Normal:  <150 mg/dLBorderline High:  150 - 199 mg/dL  . HDL 03/22/2018 28.90* >39.00 mg/dL Final  . VLDL 03/22/2018 25.0  0.0 - 40.0 mg/dL Final  . LDL Cholesterol 03/22/2018 65  0 - 99 mg/dL Final  . Total CHOL/HDL Ratio 03/22/2018 4   Final                  Men          Women1/2 Average Risk     3.4          3.3Average Risk          5.0          4.42X Average Risk          9.6          7.13X Average Risk          15.0          11.0                      . NonHDL 03/22/2018 89.83   Final   NOTE:  Non-HDL goal should be 30 mg/dL higher than patient's LDL goal (i.e. LDL goal of < 70 mg/dL, would  have non-HDL goal of < 100 mg/dL)  . Microalb, Ur 03/22/2018 1.3  0.0 - 1.9 mg/dL Final  . Creatinine,U 03/22/2018 158.3  mg/dL Final  . Microalb Creat Ratio 03/22/2018 0.8  0.0 - 30.0 mg/g Final  . Sodium 03/22/2018 139  135 - 145 mEq/L Final  . Potassium 03/22/2018 4.5  3.5 - 5.1 mEq/L Final  . Chloride 03/22/2018 104  96 - 112 mEq/L Final  . CO2 03/22/2018 28  19 - 32 mEq/L Final  . Glucose, Bld 03/22/2018 123* 70 - 99 mg/dL Final  . BUN 03/22/2018 16  6 - 23 mg/dL Final  . Creatinine, Ser 03/22/2018 1.14  0.40 - 1.50 mg/dL Final  . Total Bilirubin 03/22/2018 0.6  0.2 - 1.2 mg/dL Final  . Alkaline Phosphatase 03/22/2018 66  39 - 117 U/L Final  . AST 03/22/2018 25  0 - 37 U/L Final  . ALT 03/22/2018 43  0 - 53 U/L Final  . Total Protein 03/22/2018 7.3  6.0 - 8.3 g/dL Final  . Albumin 03/22/2018 4.4  3.5 - 5.2 g/dL Final  . Calcium 03/22/2018 10.8* 8.4 - 10.5 mg/dL Final  . GFR 03/22/2018 69.03  >60.00 mL/min Final  . Hgb A1c MFr Bld 03/22/2018 7.7* 4.6 - 6.5 % Final   Glycemic Control Guidelines for People with Diabetes:Non Diabetic:  <6%Goal of Therapy: <7%Additional Action Suggested:  >8%     Allergies as of 03/27/2018   No Known Allergies     Medication List        Accurate as of 03/27/18 10:18 AM. Always use your most recent med list.          aspirin 81 MG tablet Take 1 tablet (81 mg total) by mouth daily. STOP TAKING AND RESTART Dec 29 2011   atorvastatin 40 MG tablet Commonly known as:  LIPITOR Take 40  mg by mouth daily.   fenofibrate 145 MG tablet Commonly known as:  TRICOR Take 145 mg by mouth daily.   losartan 100 MG tablet Commonly known as:  COZAAR Take by mouth.   meloxicam 15 MG tablet Commonly known as:  MOBIC as needed.   metFORMIN 750 MG 24 hr tablet Commonly known as:  GLUCOPHAGE-XR Take 2 tablets (1,500 mg total) by mouth daily with breakfast.   ONE TOUCH ULTRA TEST test strip Generic drug:  glucose blood USE TO CHECK BLOOD SUGAR ONCE A  DAY DX CODE G38.75   ONETOUCH DELICA LANCETS 64P Misc USE TO CHECK BLOOD SUGAR ONCE A DAY DX CODE P29.51   TRULICITY 1.5 OA/4.1YS Sopn Generic drug:  Dulaglutide INJECT 1.5 MG WEEKLY       Allergies: No Known Allergies  Past Medical History:  Diagnosis Date  . Diabetes mellitus without complication ([place])   . Hyperlipidemia   . Hypertension   . Personal history of adenomatous colonic polyps 01/19/2011  . Sleep apnea    not using CPAP  . Ulcer    30 years ago    Past Surgical History:  Procedure Laterality Date  . COLONOSCOPY  12/14/2011   Procedure: COLONOSCOPY;  Surgeon: Gatha Mayer, MD;  Location: [facility];  Service: Endoscopy;  Laterality: N/A;  colon wiht APC for follow up of colon polyps  . COLONOSCOPY N/A 10/10/2012   Procedure: COLONOSCOPY;  Surgeon: Gatha Mayer, MD;  Location: [facility];  Service: Endoscopy;  Laterality: N/A;  Need APC  . COLONOSCOPY    . COLONOSCOPY W/ POLYPECTOMY  01/13/11   3 polyps, largest 3 cm cecal polyp - adenomas  . HOT HEMOSTASIS  12/14/2011   Procedure: HOT HEMOSTASIS (ARGON PLASMA COAGULATION/BICAP);  Surgeon: Gatha Mayer, MD;  Location: [facility];  Service: Endoscopy;  Laterality: N/A;  . POLYPECTOMY      Family History  Problem Relation Age of Onset  . Colon cancer Mother 45  . Heart disease Mother   . Heart disease Father   . Cancer - Other Daughter        glioblastoma- died age 14   . Obesity Neg Hx   . Sleep apnea Neg Hx   . Stroke Neg Hx   . Heart attack Neg Hx   . Colon polyps Neg Hx   . Rectal cancer Neg Hx   . Stomach cancer Neg Hx   . Esophageal cancer Neg Hx     Social History:  reports that he has never smoked. He has never used smokeless tobacco. He reports that he drinks alcohol. He reports that he does not use drugs.    Review of Systems       Lipids: Has been on Lipitor for over 10 years for hypercholesterolemia, Also followed by PCP. Triglycerides are controlled with fenofibrate LDL is  controlled but his HDL has been consistently low    Lab Results  Component Value Date   CHOL 119 03/22/2018   HDL 28.90 (L) 03/22/2018   LDLCALC 65 03/22/2018   TRIG 125.0 03/22/2018   CHOLHDL 4 03/22/2018           Has history of sleep apnea, not treated, not significantly symptomatic at this time  HYPERTENSION: He is on losartan 100 mg daily from his PCP   Blood pressure is controlled   BP Readings from Last 3 Encounters:  03/27/18 128/72  12/27/17 134/78  09/27/17 132/80    7.5 mg meloxicam helps his knee pain  which he has more when he is walking, taking as needed and every couple of weeks usually   Last foot exam in 2/19   Physical Examination:  BP 128/72 (BP Location: Left Arm, Patient Position: Sitting, Cuff Size: Large)   Pulse 77   Ht 6' (1.829 m)   Wt (!) 300 lb 12.8 oz (136.4 kg)   SpO2 96%   BMI 40.80 kg/m    ASSESSMENT:  Diabetes type 2, With obesity    See history of present illness for discussion of current diabetes management, blood sugar patterns and problems identified  His A1c is 7.7 and higher  He is again very inconsistent with taking his Trulicity which normally helps his blood sugar and appetite control He does not exercise because of knee pain on walking Also inconsistent with diet especially with traveling  He apparently is at his maximum weight in the last few years Again he is not consistent with taking his Trulicity and says that he will start doing this now since it has previously helped him both to keep his weight down and help and follow his diet and better blood sugar control also   HYPERTENSION: His blood pressure is controlled, Followed by PCP     PLAN:   Continue Trulicity 1.5 mg weekly to be taken regularly He can try doing water aerobics for exercise Advised him to be consistent with efforts to lose weight and take his medication consistently If he still has poor control and no weight loss in the next visit may  consider adding phentermine and Topamax since Belviq is not covered  There are no Patient Instructions on file for this visit.   Elayne Snare 03/27/2018, 10:18 AM

## 2018-05-14 DIAGNOSIS — H60502 Unspecified acute noninfective otitis externa, left ear: Secondary | ICD-10-CM | POA: Diagnosis not present

## 2018-05-14 DIAGNOSIS — E1159 Type 2 diabetes mellitus with other circulatory complications: Secondary | ICD-10-CM | POA: Diagnosis not present

## 2018-05-14 DIAGNOSIS — I10 Essential (primary) hypertension: Secondary | ICD-10-CM | POA: Diagnosis not present

## 2018-07-02 ENCOUNTER — Other Ambulatory Visit (INDEPENDENT_AMBULATORY_CARE_PROVIDER_SITE_OTHER): Payer: BLUE CROSS/BLUE SHIELD

## 2018-07-02 DIAGNOSIS — E1165 Type 2 diabetes mellitus with hyperglycemia: Secondary | ICD-10-CM | POA: Diagnosis not present

## 2018-07-02 LAB — BASIC METABOLIC PANEL
BUN: 20 mg/dL (ref 6–23)
CO2: 27 mEq/L (ref 19–32)
CREATININE: 1.16 mg/dL (ref 0.40–1.50)
Calcium: 9.7 mg/dL (ref 8.4–10.5)
Chloride: 106 mEq/L (ref 96–112)
GFR: 67.6 mL/min (ref >60.00)
Glucose, Bld: 249 mg/dL — ABNORMAL HIGH (ref 70–99)
POTASSIUM: 3.8 meq/L (ref 3.5–5.1)
Sodium: 140 mEq/L (ref 135–145)

## 2018-07-02 LAB — HEMOGLOBIN A1C: Hgb A1c MFr Bld: 7.9 % — ABNORMAL HIGH (ref 4.6–6.5)

## 2018-07-06 ENCOUNTER — Ambulatory Visit: Payer: BLUE CROSS/BLUE SHIELD | Admitting: Endocrinology

## 2018-07-12 ENCOUNTER — Encounter: Payer: Self-pay | Admitting: Endocrinology

## 2018-07-12 ENCOUNTER — Ambulatory Visit: Payer: BLUE CROSS/BLUE SHIELD | Admitting: Endocrinology

## 2018-07-12 ENCOUNTER — Other Ambulatory Visit: Payer: Self-pay

## 2018-07-12 VITALS — BP 142/70 | HR 88 | Ht 72.0 in | Wt 299.0 lb

## 2018-07-12 DIAGNOSIS — E1165 Type 2 diabetes mellitus with hyperglycemia: Secondary | ICD-10-CM | POA: Diagnosis not present

## 2018-07-12 MED ORDER — GLIMEPIRIDE 2 MG PO TABS: 2.0000 mg | ORAL_TABLET | Freq: Every day | ORAL | 2 refills | Status: DC

## 2018-07-12 MED ORDER — GLUCOSE BLOOD VI STRP: ORAL_STRIP | 3 refills | Status: DC

## 2018-07-12 MED ORDER — DULAGLUTIDE 0.75 MG/0.5ML ~~LOC~~ SOAJ: SUBCUTANEOUS | 0 refills | Status: DC

## 2018-07-12 NOTE — Progress Notes (Signed)
Patient ID: Mark White, male   DOB: 05-20-1956, 62 y.o.   MRN: [id number]   Reason for Appointment : Followup for Type 2 Diabetes  History of Present Illness          Diagnosis: Type 2 diabetes mellitus, date of diagnosis:   2013        Past history:  At diagnosis he was having borderline hyperglycemia. He had previously been taking metformin from his PCP. He was prescribed this twice a day but would tend to forget and take it once a day.  His A1c in early 2014 was 7.2   Because of his relatively high A1c of 7.5 and obesity he was started on Trulicity 9.38 mg weekly in 06/2013 Previously in 09/2014 he had made substantial changes in his diet and lost over 20 pounds His A1c with this was also much better than usual at 5.8  Recent history:   Non-insulin hypoglycemic drugs the patient is taking are: Metformin ER 750 mg, 2 tablets daily at dinnertime, Trulicity 1.5 mg weekly  His A1c has gone up to 7.9 now  Current blood sugar patterns and problems identified:  He stopped taking his Trulicity after his last visit  Even though there was no discussion about stopping the medication he thought he was going to just try diet and exercise with metformin  Previously had difficulties with not getting enough satiety with taking Trulicity after being on it for a while  Also has been irregular with checking his blood sugars and only started checking again when he was starting to feel more tired and lethargic.  Highest blood sugar has been 290 at home.  Blood sugars tend to be higher with more high carbohydrate meals  However he does not understand that his blood sugars can be higher with high-fat meals also Lately has not been doing much exercise His weight is about the same  Side effects from medications have been: None      Compliance with the medical regimen:  Fair, recently worse   Glucose monitoring:  done sporadically       Glucometer:    One Touch ultra mini Blood  Glucose readings:   Range 180-290  Blood sugars being checked at various times MEDIAN 213, previously 138  Self-care: The diet that the patient has been following is: No specific diet Diet history:  Typical breakfast is an egg,  with bread   Periodically eating out including when traveling  Physical activity: exercise:  some walking      Dietician visit: Most recent: 5/14, does not want to pay for this again.      Weight history: Usually ranging from 290-305  Wt Readings from Last 3 Encounters:  07/12/18 299 lb (135.6 kg)  03/27/18 (!) 300 lb 12.8 oz (136.4 kg)  12/27/17 (!) 304 lb 9.6 oz (138.2 kg)    Diabetes control:    Lab Results  Component Value Date   HGBA1C 7.9 (H) 07/02/2018   HGBA1C 7.7 (H) 03/22/2018   HGBA1C 7.4 (H) 12/22/2017   Lab Results  Component Value Date   MICROALBUR 1.3 03/22/2018   LDLCALC 65 03/22/2018   CREATININE 1.16 07/02/2018    No visits with results within 1 Week(s) from this visit.  Latest known visit with results is:  Lab on 07/02/2018  Component Date Value Ref Range Status  . Sodium 07/02/2018 140  135 - 145 mEq/L Final  . Potassium 07/02/2018 3.8  3.5 - 5.1 mEq/L Final  .  Chloride 07/02/2018 106  96 - 112 mEq/L Final  . CO2 07/02/2018 27  19 - 32 mEq/L Final  . Glucose, Bld 07/02/2018 249* 70 - 99 mg/dL Final  . BUN 07/02/2018 20  6 - 23 mg/dL Final  . Creatinine, Ser 07/02/2018 1.16  0.40 - 1.50 mg/dL Final  . Calcium 07/02/2018 9.7  8.4 - 10.5 mg/dL Final  . GFR 07/02/2018 67.60  >60.00 mL/min Final  . Hgb A1c MFr Bld 07/02/2018 7.9* 4.6 - 6.5 % Final   Glycemic Control Guidelines for People with Diabetes:Non Diabetic:  <6%Goal of Therapy: <7%Additional Action Suggested:  >8%     Allergies as of 07/12/2018   No Known Allergies     Medication List       Accurate as of July 12, 2018 10:59 AM. Always use your most recent med list.        aspirin 81 MG tablet Take 1 tablet (81 mg total) by mouth daily. STOP  TAKING AND RESTART Dec 29 2011   atorvastatin 40 MG tablet Commonly known as:  LIPITOR Take 40 mg by mouth daily.   fenofibrate 145 MG tablet Commonly known as:  TRICOR Take 145 mg by mouth daily.   losartan 100 MG tablet Commonly known as:  COZAAR Take by mouth.   metFORMIN 750 MG 24 hr tablet Commonly known as:  GLUCOPHAGE-XR Take 2 tablets (1,500 mg total) by mouth daily with breakfast.   ONE TOUCH ULTRA TEST test strip Generic drug:  glucose blood USE TO CHECK BLOOD SUGAR ONCE A DAY DX CODE B76.28   ONETOUCH DELICA LANCETS 31D Misc USE TO CHECK BLOOD SUGAR ONCE A DAY DX CODE E11.65       Allergies: No Known Allergies  Past Medical History:  Diagnosis Date  . Diabetes mellitus without complication ([place])   . Hyperlipidemia   . Hypertension   . Personal history of adenomatous colonic polyps 01/19/2011  . Sleep apnea    not using CPAP  . Ulcer    30 years ago    Past Surgical History:  Procedure Laterality Date  . COLONOSCOPY  12/14/2011   Procedure: COLONOSCOPY;  Surgeon: Gatha Mayer, MD;  Location: [facility];  Service: Endoscopy;  Laterality: N/A;  colon wiht APC for follow up of colon polyps  . COLONOSCOPY N/A 10/10/2012   Procedure: COLONOSCOPY;  Surgeon: Gatha Mayer, MD;  Location: [facility];  Service: Endoscopy;  Laterality: N/A;  Need APC  . COLONOSCOPY    . COLONOSCOPY W/ POLYPECTOMY  01/13/11   3 polyps, largest 3 cm cecal polyp - adenomas  . HOT HEMOSTASIS  12/14/2011   Procedure: HOT HEMOSTASIS (ARGON PLASMA COAGULATION/BICAP);  Surgeon: Gatha Mayer, MD;  Location: [facility];  Service: Endoscopy;  Laterality: N/A;  . POLYPECTOMY      Family History  Problem Relation Age of Onset  . Colon cancer Mother 64  . Heart disease Mother   . Heart disease Father   . Cancer - Other Daughter        glioblastoma- died age 76   . Obesity Neg Hx   . Sleep apnea Neg Hx   . Stroke Neg Hx   . Heart attack Neg Hx   . Colon polyps Neg Hx   .  Rectal cancer Neg Hx   . Stomach cancer Neg Hx   . Esophageal cancer Neg Hx     Social History:  reports that he has never smoked. He has never used smokeless tobacco.  He reports current alcohol use. He reports that he does not use drugs.    Review of Systems       Lipids: Has been on Lipitor for over 10 years for hypercholesterolemia, Also followed by PCP. Triglycerides are controlled with fenofibrate LDL is controlled but his HDL has been consistently low    Lab Results  Component Value Date   CHOL 119 03/22/2018   HDL 28.90 (L) 03/22/2018   LDLCALC 65 03/22/2018   TRIG 125.0 03/22/2018   CHOLHDL 4 03/22/2018           Has history of sleep apnea, not treated, not significantly symptomatic at this time  HYPERTENSION: He is on losartan 100 mg from his PCP    BP Readings from Last 3 Encounters:  07/12/18 (!) 142/70  03/27/18 128/72  12/27/17 134/78   Has used meloxicam at times for knee pain  Last foot exam in 2/19   Physical Examination:  BP (!) 142/70 (BP Location: Left Arm, Patient Position: Sitting, Cuff Size: Large)   Pulse 88   Ht 6' (1.829 m)   Wt 299 lb (135.6 kg)   SpO2 96%   BMI 40.55 kg/m    ASSESSMENT:  Diabetes type 2, With obesity    See history of present illness for discussion of current diabetes management, blood sugar patterns and problems identified  His A1c is 7.9 and higher than usual  He is not taking Trulicity He does not understand that this is indicated to control his blood sugars more than just appetite and he misunderstood the discussion previously However it is likely that he may have had some progression of his diabetes since his blood sugars are significantly high with metformin alone Also diet can be significantly better at times Currently not able to exercise much Also not checking blood sugars consistently   HYPERTENSION: His blood pressure is controlled, Followed by PCP     PLAN:   Restart Trulicity but will  need to try a 0.75 for 4 weeks before going to 1.5 mg weekly Because of his increased hyperglycemia he will also add Amaryl 2 mg daily at dinnertime He will stop this after his blood sugars come back down to about 100-110 Cut back on high fat foods along with reduced carbohydrates Consider Invokana on the next visit Follow-up in 6 weeks  There are no Patient Instructions on file for this visit.   Elayne Snare 07/12/2018, 10:59 AM

## 2018-07-17 DIAGNOSIS — E1169 Type 2 diabetes mellitus with other specified complication: Secondary | ICD-10-CM | POA: Diagnosis not present

## 2018-07-17 DIAGNOSIS — Z0000 Encounter for general adult medical examination without abnormal findings: Secondary | ICD-10-CM | POA: Diagnosis not present

## 2018-07-17 DIAGNOSIS — Z1159 Encounter for screening for other viral diseases: Secondary | ICD-10-CM | POA: Diagnosis not present

## 2018-07-17 DIAGNOSIS — E1159 Type 2 diabetes mellitus with other circulatory complications: Secondary | ICD-10-CM | POA: Diagnosis not present

## 2018-07-17 DIAGNOSIS — I10 Essential (primary) hypertension: Secondary | ICD-10-CM | POA: Diagnosis not present

## 2018-08-15 ENCOUNTER — Telehealth: Payer: Self-pay | Admitting: Endocrinology

## 2018-08-15 ENCOUNTER — Other Ambulatory Visit: Payer: Self-pay

## 2018-08-15 MED ORDER — DULAGLUTIDE 0.75 MG/0.5ML ~~LOC~~ SOAJ: SUBCUTANEOUS | 4 refills | Status: DC

## 2018-08-15 NOTE — Telephone Encounter (Signed)
Rx sent 

## 2018-08-15 NOTE — Telephone Encounter (Signed)
Patient requests RX for Trulicty-75. If Dr. Dwyane Dee prefers to send Trulicity 458 that will be okaay but patient prefers the Trulicity 75 be sent to [facility] in [facility].

## 2018-08-23 ENCOUNTER — Other Ambulatory Visit (INDEPENDENT_AMBULATORY_CARE_PROVIDER_SITE_OTHER): Payer: BLUE CROSS/BLUE SHIELD

## 2018-08-23 DIAGNOSIS — E1165 Type 2 diabetes mellitus with hyperglycemia: Secondary | ICD-10-CM

## 2018-08-23 LAB — BASIC METABOLIC PANEL
BUN: 15 mg/dL (ref 6–23)
CO2: 26 mEq/L (ref 19–32)
Calcium: 9.8 mg/dL (ref 8.4–10.5)
Chloride: 105 mEq/L (ref 96–112)
Creatinine, Ser: 1.15 mg/dL (ref 0.40–1.50)
GFR: 64.21 mL/min (ref >60.00)
Glucose, Bld: 81 mg/dL (ref 70–99)
Potassium: 4 mEq/L (ref 3.5–5.1)
Sodium: 139 mEq/L (ref 135–145)

## 2018-08-24 LAB — FRUCTOSAMINE: Fructosamine: 213 umol/L (ref 0–285)

## 2018-08-28 ENCOUNTER — Ambulatory Visit (INDEPENDENT_AMBULATORY_CARE_PROVIDER_SITE_OTHER): Payer: BLUE CROSS/BLUE SHIELD | Admitting: Endocrinology

## 2018-08-28 ENCOUNTER — Encounter: Payer: Self-pay | Admitting: Endocrinology

## 2018-08-28 VITALS — BP 128/74 | HR 84 | Ht 72.0 in | Wt 297.6 lb

## 2018-08-28 DIAGNOSIS — E1165 Type 2 diabetes mellitus with hyperglycemia: Secondary | ICD-10-CM | POA: Diagnosis not present

## 2018-08-28 NOTE — Progress Notes (Signed)
Patient ID: Mark White, male   DOB: Dec 10, 1955, 63 y.o.   MRN: [id number]   Reason for Appointment : Followup for Type 2 Diabetes  History of Present Illness          Diagnosis: Type 2 diabetes mellitus, date of diagnosis:   2013        Past history:  At diagnosis he was having borderline hyperglycemia. He had previously been taking metformin from his PCP. He was prescribed this twice a day but would tend to forget and take it once a day.  His A1c in early 2014 was 7.2   Because of his relatively high A1c of 7.5 and obesity he was started on Trulicity 8.65 mg weekly in 06/2013 Previously in 09/2014 he had made substantial changes in his diet and lost over 20 pounds His A1c with this was also much better than usual at 5.8  Recent history:   Non-insulin hypoglycemic drugs: Metformin ER 750 mg, 2 tablets daily at dinnertime, Trulicity 7.84 mg weekly, Amaryl 2 mg daily  His A1c has gone up to 7.9 as of 12/19 but fructosamine is excellent at 213  Current blood sugar patterns and problems identified:  He restarted taking his Trulicity after his last visit  However he was supposed to go up to 1.5 mg and he wanted not to do this because he thinks sometimes he feels bad with the medication which is nonspecific  He is checking his blood sugars with an outdated test strips, and appears to have mostly high readings up to 244  His lab glucose fasting was only 81  He is only complaining of some fatigue in the afternoon after lunch but no symptoms of hypoglycemia with adding AMARYL on his last visit  He is reportedly more active but not doing a lot of formal exercise  Has a slight improvement in his weight despite the holidays He is not clear what brand of meter his insurance will cover  Side effects from medications have been: None      Compliance with the medical regimen:  Fair, recently worse   Glucose monitoring:  done sporadically       Glucometer:    One Touch ultra  mini Blood Glucose readings:   Range 118-244, mostly checking in the mornings, some at that time   Self-care: The diet that the patient has been following is: No specific diet Diet history:  Typical breakfast is an egg,  with bread   Periodically eating out including when traveling  Physical activity: exercise:  some walking      Dietician visit: Most recent: 5/14, does not want to pay for this again.      Weight history: Usually ranging from 290-305  Wt Readings from Last 3 Encounters:  08/28/18 297 lb 9.6 oz (135 kg)  07/12/18 299 lb (135.6 kg)  03/27/18 (!) 300 lb 12.8 oz (136.4 kg)    Diabetes control:    Lab Results  Component Value Date   HGBA1C 7.9 (H) 07/02/2018   HGBA1C 7.7 (H) 03/22/2018   HGBA1C 7.4 (H) 12/22/2017   Lab Results  Component Value Date   MICROALBUR 1.3 03/22/2018   LDLCALC 65 03/22/2018   CREATININE 1.15 08/23/2018    Lab on 08/23/2018  Component Date Value Ref Range Status  . Sodium 08/23/2018 139  135 - 145 mEq/L Final  . Potassium 08/23/2018 4.0  3.5 - 5.1 mEq/L Final  . Chloride 08/23/2018 105  96 - 112 mEq/L Final  .  CO2 08/23/2018 26  19 - 32 mEq/L Final  . Glucose, Bld 08/23/2018 81  70 - 99 mg/dL Final  . BUN 08/23/2018 15  6 - 23 mg/dL Final  . Creatinine, Ser 08/23/2018 1.15  0.40 - 1.50 mg/dL Final  . Calcium 08/23/2018 9.8  8.4 - 10.5 mg/dL Final  . GFR 08/23/2018 64.21  >60.00 mL/min Final  . Fructosamine 08/23/2018 213  0 - 285 umol/L Final   Comment: Published reference interval for apparently healthy subjects between age 1 and 72 is 36 - 285 umol/L and in a poorly controlled diabetic population is 228 - 563 umol/L with a mean of 396 umol/L.     Allergies as of 08/28/2018   No Known Allergies     Medication List       Accurate as of August 28, 2018  3:50 PM. Always use your most recent med list.        aspirin 81 MG tablet Take 1 tablet (81 mg total) by mouth daily. STOP TAKING AND RESTART Dec 29 2011    atorvastatin 40 MG tablet Commonly known as:  LIPITOR Take 40 mg by mouth daily.   Dulaglutide 0.75 MG/0.5ML Sopn Commonly known as:  TRULICITY Inject in the abdominal skin as directed once a week   fenofibrate 145 MG tablet Commonly known as:  TRICOR Take 145 mg by mouth daily.   glimepiride 2 MG tablet Commonly known as:  AMARYL Take 1 tablet (2 mg total) by mouth daily after supper.   glucose blood test strip Commonly known as:  ONE TOUCH ULTRA TEST USE TO CHECK BLOOD SUGAR ONCE A DAY DX CODE E11.65   losartan 100 MG tablet Commonly known as:  COZAAR Take by mouth.   metFORMIN 750 MG 24 hr tablet Commonly known as:  GLUCOPHAGE-XR Take 2 tablets (1,500 mg total) by mouth daily with breakfast.   ONETOUCH DELICA LANCETS 52W Misc USE TO CHECK BLOOD SUGAR ONCE A DAY DX CODE E11.65       Allergies: No Known Allergies  Past Medical History:  Diagnosis Date  . Diabetes mellitus without complication ([place])   . Hyperlipidemia   . Hypertension   . Personal history of adenomatous colonic polyps 01/19/2011  . Sleep apnea    not using CPAP  . Ulcer    30 years ago    Past Surgical History:  Procedure Laterality Date  . COLONOSCOPY  12/14/2011   Procedure: COLONOSCOPY;  Surgeon: Gatha Mayer, MD;  Location: [facility];  Service: Endoscopy;  Laterality: N/A;  colon wiht APC for follow up of colon polyps  . COLONOSCOPY N/A 10/10/2012   Procedure: COLONOSCOPY;  Surgeon: Gatha Mayer, MD;  Location: [facility];  Service: Endoscopy;  Laterality: N/A;  Need APC  . COLONOSCOPY    . COLONOSCOPY W/ POLYPECTOMY  01/13/11   3 polyps, largest 3 cm cecal polyp - adenomas  . HOT HEMOSTASIS  12/14/2011   Procedure: HOT HEMOSTASIS (ARGON PLASMA COAGULATION/BICAP);  Surgeon: Gatha Mayer, MD;  Location: [facility];  Service: Endoscopy;  Laterality: N/A;  . POLYPECTOMY      Family History  Problem Relation Age of Onset  . Colon cancer Mother 47  . Heart disease Mother   .  Heart disease Father   . Cancer - Other Daughter        glioblastoma- died age 90   . Obesity Neg Hx   . Sleep apnea Neg Hx   . Stroke Neg Hx   .  Heart attack Neg Hx   . Colon polyps Neg Hx   . Rectal cancer Neg Hx   . Stomach cancer Neg Hx   . Esophageal cancer Neg Hx     Social History:  reports that he has never smoked. He has never used smokeless tobacco. He reports current alcohol use. He reports that he does not use drugs.    Review of Systems       Lipids: Has been on Lipitor for over 10 years for hypercholesterolemia,  followed by PCP. Triglycerides are controlled with fenofibrate LDL is controlled but his HDL has been consistently low    Lab Results  Component Value Date   CHOL 119 03/22/2018   HDL 28.90 (L) 03/22/2018   LDLCALC 65 03/22/2018   TRIG 125.0 03/22/2018   CHOLHDL 4 03/22/2018           Has history of sleep apnea with variable symptoms  HYPERTENSION: He is on losartan 100 mg from his PCP   BP Readings from Last 3 Encounters:  08/28/18 128/74  07/12/18 (!) 142/70  03/27/18 128/72   Has used meloxicam at times for knee pain  Last foot exam in 2/19   Physical Examination:  BP 128/74 (BP Location: Left Arm, Patient Position: Sitting, Cuff Size: Normal)   Pulse 84   Ht 6' (1.829 m)   Wt 297 lb 9.6 oz (135 kg)   SpO2 98%   BMI 40.36 kg/m    ASSESSMENT:  Diabetes type 2, With obesity    See history of present illness for discussion of current diabetes management, blood sugar patterns and problems identified  His A1c is 7.9 last month However with adding Amaryl and restarting Trulicity his fructosamine is excellent at 213  His blood sugar readings are unreliable and from his home meter Blood glucose down to 81 He is doing a little better with his exercise regimen although can do much better with efforts to lose weight He is still not wanting to increase his Trulicity but wants to try on his own Since his blood sugars may be as low  as 81 he will reduce his Amaryl to half a tablet   HYPERTENSION: His blood pressure is controlled, Followed by PCP    PLAN:  As above He will call to let us know what meter his insurance will cover He will need to check readings consistently and call if consistently high or low Follow-up in 2 months for A1c  Patient Instructions  Check blood sugars on waking up 3 days a week  Also check blood sugars about 2 hours after meals and do this after different meals by rotation  Recommended blood sugar levels on waking up are 90-130 and about 2 hours after meal is 130-160  Please bring your blood sugar monitor to each visit, thank you   Reduce Glimeperide to 1/2 pill   More walking       Mark White 08/28/2018, 3:50 PM

## 2018-08-28 NOTE — Patient Instructions (Addendum)
Check blood sugars on waking up 3 days a week  Also check blood sugars about 2 hours after meals and do this after different meals by rotation  Recommended blood sugar levels on waking up are 90-130 and about 2 hours after meal is 130-160  Please bring your blood sugar monitor to each visit, thank you   Reduce Glimeperide to 1/2 pill   More walking

## 2018-09-04 ENCOUNTER — Other Ambulatory Visit: Payer: Self-pay | Admitting: Endocrinology

## 2018-09-07 ENCOUNTER — Other Ambulatory Visit: Payer: Self-pay

## 2018-09-07 MED ORDER — ACCU-CHEK GUIDE W/DEVICE KIT: 1.0000 | PACK | Freq: Every day | 0 refills | Status: AC

## 2018-10-07 ENCOUNTER — Other Ambulatory Visit: Payer: Self-pay | Admitting: Endocrinology

## 2018-10-20 ENCOUNTER — Other Ambulatory Visit: Payer: Self-pay | Admitting: Endocrinology

## 2018-10-25 ENCOUNTER — Other Ambulatory Visit: Payer: Self-pay

## 2018-10-29 ENCOUNTER — Other Ambulatory Visit: Payer: Self-pay

## 2018-10-29 ENCOUNTER — Other Ambulatory Visit (INDEPENDENT_AMBULATORY_CARE_PROVIDER_SITE_OTHER): Payer: BLUE CROSS/BLUE SHIELD

## 2018-10-29 DIAGNOSIS — E1165 Type 2 diabetes mellitus with hyperglycemia: Secondary | ICD-10-CM

## 2018-10-29 LAB — BASIC METABOLIC PANEL
BUN: 19 mg/dL (ref 6–23)
CO2: 26 meq/L (ref 19–32)
Calcium: 9.7 mg/dL (ref 8.4–10.5)
Chloride: 107 mEq/L (ref 96–112)
Creatinine, Ser: 1.12 mg/dL (ref 0.40–1.50)
GFR: 66.16 mL/min (ref >60.00)
Glucose, Bld: 113 mg/dL — ABNORMAL HIGH (ref 70–99)
Potassium: 3.9 mEq/L (ref 3.5–5.1)
Sodium: 140 mEq/L (ref 135–145)

## 2018-10-29 LAB — HEMOGLOBIN A1C: Hgb A1c MFr Bld: 6.6 % — ABNORMAL HIGH (ref 4.6–6.5)

## 2018-10-31 ENCOUNTER — Encounter: Payer: Self-pay | Admitting: Endocrinology

## 2018-10-31 ENCOUNTER — Ambulatory Visit (INDEPENDENT_AMBULATORY_CARE_PROVIDER_SITE_OTHER): Payer: BLUE CROSS/BLUE SHIELD | Admitting: Endocrinology

## 2018-10-31 ENCOUNTER — Other Ambulatory Visit: Payer: Self-pay

## 2018-10-31 DIAGNOSIS — E1165 Type 2 diabetes mellitus with hyperglycemia: Secondary | ICD-10-CM

## 2018-10-31 DIAGNOSIS — E782 Mixed hyperlipidemia: Secondary | ICD-10-CM

## 2018-10-31 NOTE — Progress Notes (Addendum)
Patient ID: Mark White, male   DOB: November 18, 1955, 63 y.o.   MRN: [id number]   Today's office visit was provided via telemedicine using video technique Explained to the patient and the the limitations of evaluation and management by telemedicine and the availability of in person appointments.  The patient understood the limitations and agreed to proceed. Patient also understood that the telehealth visit is billable. . Location of the patient: Home . Location of the provider: Office Only the patient and myself were participating in the encounter    Reason for Appointment : Followup for Type 2 Diabetes  History of Present Illness          Diagnosis: Type 2 diabetes mellitus, date of diagnosis:   2013        Past history:  At diagnosis he was having borderline hyperglycemia. He had previously been taking metformin from his PCP. He was prescribed this twice a day but would tend to forget and take it once a day.  His A1c in early 2014 was 7.2   Because of his relatively high A1c of 7.5 and obesity he was started on Trulicity 1.15 mg weekly in 06/2013 Previously in 09/2014 he had made substantial changes in his diet and lost over 20 pounds His A1c with this was also much better than usual at 5.8  Recent history:   Non-insulin hypoglycemic drugs: Metformin ER 750 mg, 2 tablets daily at dinnertime, Trulicity 7.26 mg weekly, Amaryl 2 mg daily  His A1c had gone up to 7.9 as of 12/19 but now 6.6  Current blood sugar patterns and problems identified:  He was also put on Amaryl in addition to his Trulicity in 20/35  He was recommended 1.5 Trulicity but he wanted to try on his own with 0.75 mg since he has been feeling of malaise and not feeling well with the Trulicity higher dose for a day or 2  He is again reporting difficulty controlling portions and increased hunger  Not sure if his weight has gone down or not, he does not check his weight regularly  He is also not doing  much walking because of knee pain and lack of motivation, doing mostly some yard work  He is now using an Accu-Chek meter and doing blood sugar testing sort of sporadically  He has a few postprandial readings that are over 160 and he thinks his postprandial average is 168 from his meter Is still taking his Amaryl as before and metformin  Side effects from medications have been: None      Compliance with the medical regimen:  Fair, recently worse   Glucose monitoring:  done every 2 to 3 days      Glucometer:    Accu-Chek Blood Glucose readings:   Postprandial average 168 with blood sugar after meals recent range 145-226 Overall blood sugar range at home 111-226 His lab fasting glucose was 113  Self-care: The diet that the patient has been following is: No specific diet Diet history:  Typical breakfast is an egg,  with bread   Periodically eating out including when traveling  Physical activity: exercise:  some walking      Dietician visit: Most recent: 5/14, does not want to pay for this again.      Weight history: Usually ranging from 290-305  Wt Readings from Last 3 Encounters:  08/28/18 297 lb 9.6 oz (135 kg)  07/12/18 299 lb (135.6 kg)  03/27/18 (!) 300 lb 12.8 oz (136.4 kg)  Diabetes control:    Lab Results  Component Value Date   HGBA1C 6.6 (H) 10/29/2018   HGBA1C 7.9 (H) 07/02/2018   HGBA1C 7.7 (H) 03/22/2018   Lab Results  Component Value Date   MICROALBUR 1.3 03/22/2018   LDLCALC 65 03/22/2018   CREATININE 1.12 10/29/2018    Lab on 10/29/2018  Component Date Value Ref Range Status  . Sodium 10/29/2018 140  135 - 145 mEq/L Final  . Potassium 10/29/2018 3.9  3.5 - 5.1 mEq/L Final  . Chloride 10/29/2018 107  96 - 112 mEq/L Final  . CO2 10/29/2018 26  19 - 32 mEq/L Final  . Glucose, Bld 10/29/2018 113* 70 - 99 mg/dL Final  . BUN 10/29/2018 19  6 - 23 mg/dL Final  . Creatinine, Ser 10/29/2018 1.12  0.40 - 1.50 mg/dL Final  . Calcium 10/29/2018 9.7  8.4 -  10.5 mg/dL Final  . GFR 10/29/2018 66.16  >60.00 mL/min Final  . Hgb A1c MFr Bld 10/29/2018 6.6* 4.6 - 6.5 % Final   Glycemic Control Guidelines for People with Diabetes:Non Diabetic:  <6%Goal of Therapy: <7%Additional Action Suggested:  >8%     Allergies as of 10/31/2018   No Known Allergies     Medication List       Accurate as of October 31, 2018  9:56 AM. Always use your most recent med list.        Accu-Chek Guide test strip Generic drug:  glucose blood USE TO CHECK BLOOD SUGAR ONCE DAILY.**E11.65**   Accu-Chek Guide w/Device Kit 1 each by Does not apply route daily. Use glucose meter to check blood sugar as prescribed.   aspirin 81 MG tablet Take 1 tablet (81 mg total) by mouth daily. STOP TAKING AND RESTART Dec 29 2011   atorvastatin 40 MG tablet Commonly known as:  LIPITOR Take 40 mg by mouth daily.   Dulaglutide 0.75 MG/0.5ML Sopn Commonly known as:  Trulicity Inject in the abdominal skin as directed once a week   fenofibrate 145 MG tablet Commonly known as:  TRICOR Take 145 mg by mouth daily.   glimepiride 2 MG tablet Commonly known as:  AMARYL TAKE 1 TABLET (2 MG TOTAL) BY MOUTH DAILY AFTER SUPPER.   losartan 100 MG tablet Commonly known as:  COZAAR Take by mouth.   metFORMIN 750 MG 24 hr tablet Commonly known as:  GLUCOPHAGE-XR Take 2 tablets (1,500 mg total) by mouth daily with breakfast.   OneTouch Delica Lancets 57S Misc USE TO CHECK BLOOD SUGAR ONCE A DAY DX CODE E11.65       Allergies: No Known Allergies  Past Medical History:  Diagnosis Date  . Diabetes mellitus without complication ([place])   . Hyperlipidemia   . Hypertension   . Personal history of adenomatous colonic polyps 01/19/2011  . Sleep apnea    not using CPAP  . Ulcer    30 years ago    Past Surgical History:  Procedure Laterality Date  . COLONOSCOPY  12/14/2011   Procedure: COLONOSCOPY;  Surgeon: Gatha Mayer, MD;  Location: [facility];  Service: Endoscopy;  Laterality:  N/A;  colon wiht APC for follow up of colon polyps  . COLONOSCOPY N/A 10/10/2012   Procedure: COLONOSCOPY;  Surgeon: Gatha Mayer, MD;  Location: [facility];  Service: Endoscopy;  Laterality: N/A;  Need APC  . COLONOSCOPY    . COLONOSCOPY W/ POLYPECTOMY  01/13/11   3 polyps, largest 3 cm cecal polyp - adenomas  . HOT HEMOSTASIS  12/14/2011   Procedure: HOT HEMOSTASIS (ARGON PLASMA COAGULATION/BICAP);  Surgeon: Gatha Mayer, MD;  Location: [facility];  Service: Endoscopy;  Laterality: N/A;  . POLYPECTOMY      Family History  Problem Relation Age of Onset  . Colon cancer Mother 64  . Heart disease Mother   . Heart disease Father   . Cancer - Other Daughter        glioblastoma- died age 68   . Obesity Neg Hx   . Sleep apnea Neg Hx   . Stroke Neg Hx   . Heart attack Neg Hx   . Colon polyps Neg Hx   . Rectal cancer Neg Hx   . Stomach cancer Neg Hx   . Esophageal cancer Neg Hx     Social History:  reports that he has never smoked. He has never used smokeless tobacco. He reports current alcohol use. He reports that he does not use drugs.    Review of Systems       Lipids: Has been on Lipitor for over 10 years for hypercholesterolemia,  followed by PCP. Triglycerides are controlled with fenofibrate LDL is controlled but his HDL has been consistently low    Lab Results  Component Value Date   CHOL 119 03/22/2018   HDL 28.90 (L) 03/22/2018   LDLCALC 65 03/22/2018   TRIG 125.0 03/22/2018   CHOLHDL 4 03/22/2018           Has history of sleep apnea with variable symptoms  HYPERTENSION: He is on losartan 100 mg  Does not monitor at home  BP Readings from Last 3 Encounters:  08/28/18 128/74  07/12/18 (!) 142/70  03/27/18 128/72     Last foot exam in 2/19   Physical Examination:  There were no vitals taken for this visit.   ASSESSMENT:  Diabetes type 2, With obesity    See history of present illness for discussion of current diabetes management, blood  sugar patterns and problems identified  His A1c is significantly better at 6.6 This is with adding Amaryl to his Trulicity  He still has relatively higher readings after meals and he is reporting increased hunger Not exercising very much and not clear if he has lost any weight Discussed increasing Trulicity dose which previously had caused nonspecific malaise for a day or 2 after the injection   HYPERTENSION: His blood pressure has been controlled with losartan but needs follow-up measurement    PLAN:  He will try 1.5 mg of Trulicity by using 2 pens at the same time today He will call if he has any persistent side effects but may take some time to get used to the higher dose However if he needs to stop his Amaryl he will let us know because of potential for hypoglycemia with higher dose of Trulicity He will also try to cut back on carbohydrates like mashed potatoes and other starchy foods He will try to walk with his wife more regularly in the neighborhood rather than just doing yard work for exercise To check more readings after meals and knows what makes his blood sugar go up  He will need follow-up blood pressure and foot exam on the next visit Also need report of his eye exam  There are no Patient Instructions on file for this visit.   Elayne Snare 10/31/2018, 9:56 AM

## 2019-01-05 ENCOUNTER — Other Ambulatory Visit: Payer: Self-pay | Admitting: Endocrinology

## 2019-01-22 DIAGNOSIS — E785 Hyperlipidemia, unspecified: Secondary | ICD-10-CM | POA: Diagnosis not present

## 2019-01-22 DIAGNOSIS — E1159 Type 2 diabetes mellitus with other circulatory complications: Secondary | ICD-10-CM | POA: Diagnosis not present

## 2019-01-22 DIAGNOSIS — E1169 Type 2 diabetes mellitus with other specified complication: Secondary | ICD-10-CM | POA: Diagnosis not present

## 2019-01-22 DIAGNOSIS — I10 Essential (primary) hypertension: Secondary | ICD-10-CM | POA: Diagnosis not present

## 2019-02-05 ENCOUNTER — Telehealth: Payer: Self-pay | Admitting: Endocrinology

## 2019-02-05 ENCOUNTER — Other Ambulatory Visit: Payer: Self-pay

## 2019-02-05 ENCOUNTER — Other Ambulatory Visit (INDEPENDENT_AMBULATORY_CARE_PROVIDER_SITE_OTHER): Payer: BC Managed Care – PPO

## 2019-02-05 DIAGNOSIS — E782 Mixed hyperlipidemia: Secondary | ICD-10-CM

## 2019-02-05 DIAGNOSIS — E1165 Type 2 diabetes mellitus with hyperglycemia: Secondary | ICD-10-CM

## 2019-02-05 LAB — COMPREHENSIVE METABOLIC PANEL
ALT: 31 U/L (ref 0–53)
AST: 18 U/L (ref 0–37)
Albumin: 4.2 g/dL (ref 3.5–5.2)
Alkaline Phosphatase: 62 U/L (ref 39–117)
BUN: 16 mg/dL (ref 6–23)
CO2: 25 mEq/L (ref 19–32)
Calcium: 9.3 mg/dL (ref 8.4–10.5)
Chloride: 105 mEq/L (ref 96–112)
Creatinine, Ser: 1.03 mg/dL (ref 0.40–1.50)
GFR: 72.82 mL/min (ref >60.00)
Glucose, Bld: 148 mg/dL — ABNORMAL HIGH (ref 70–99)
Potassium: 3.9 mEq/L (ref 3.5–5.1)
Sodium: 138 mEq/L (ref 135–145)
Total Bilirubin: 0.7 mg/dL (ref 0.2–1.2)
Total Protein: 6.7 g/dL (ref 6.0–8.3)

## 2019-02-05 LAB — MICROALBUMIN / CREATININE URINE RATIO
Creatinine,U: 142.1 mg/dL
Microalb Creat Ratio: 0.6 mg/g (ref 0.0–30.0)
Microalb, Ur: 0.8 mg/dL (ref 0.0–1.9)

## 2019-02-05 LAB — LIPID PANEL
Cholesterol: 144 mg/dL (ref 0–200)
HDL: 27.2 mg/dL — ABNORMAL LOW (ref >39.00)
LDL Cholesterol: 88 mg/dL (ref 0–99)
NonHDL: 116.35
Total CHOL/HDL Ratio: 5
Triglycerides: 144 mg/dL (ref 0.0–149.0)
VLDL: 28.8 mg/dL (ref 0.0–40.0)

## 2019-02-05 LAB — HEMOGLOBIN A1C: Hgb A1c MFr Bld: 7 % — ABNORMAL HIGH (ref 4.6–6.5)

## 2019-02-05 MED ORDER — ONETOUCH DELICA LANCETS 33G MISC: 0 refills | Status: DC

## 2019-02-05 MED ORDER — ACCU-CHEK GUIDE VI STRP: ORAL_STRIP | 0 refills | Status: DC

## 2019-02-05 NOTE — Telephone Encounter (Signed)
Pt is requesting RX for lancets and strips for his Accu-Chek  [id number] - [place], [place] - [address] AT [place]

## 2019-02-05 NOTE — Telephone Encounter (Signed)
Rx has been sent  

## 2019-02-08 ENCOUNTER — Encounter: Payer: Self-pay | Admitting: Endocrinology

## 2019-02-08 ENCOUNTER — Other Ambulatory Visit: Payer: Self-pay

## 2019-02-08 ENCOUNTER — Ambulatory Visit (INDEPENDENT_AMBULATORY_CARE_PROVIDER_SITE_OTHER): Payer: BC Managed Care – PPO | Admitting: Endocrinology

## 2019-02-08 DIAGNOSIS — E1165 Type 2 diabetes mellitus with hyperglycemia: Secondary | ICD-10-CM

## 2019-02-08 MED ORDER — TRULICITY 1.5 MG/0.5ML ~~LOC~~ SOAJ: SUBCUTANEOUS | 2 refills | Status: DC

## 2019-02-08 NOTE — Progress Notes (Signed)
Patient ID: Mark White, male   DOB: May 26, 1956, 63 y.o.   MRN: [id number]   Today's office visit was provided via telemedicine using video technique Explained to the patient and the the limitations of evaluation and management by telemedicine and the availability of in person appointments.  The patient understood the limitations and agreed to proceed. Patient also understood that the telehealth visit is billable. . Location of the patient: Home . Location of the provider: Office Only the patient and myself were participating in the encounter    Reason for Appointment : Followup for Type 2 Diabetes  History of Present Illness          Diagnosis: Type 2 diabetes mellitus, date of diagnosis:   2013        Past history:  At diagnosis he was having borderline hyperglycemia. He had previously been taking metformin from his PCP. He was prescribed this twice a day but would tend to forget and take it once a day.  His A1c in early 2014 was 7.2   Because of his relatively high A1c of 7.5 and obesity he was started on Trulicity 4.16 mg weekly in 06/2013 Previously in 09/2014 he had made substantial changes in his diet and lost over 20 pounds His A1c with this was also much better than usual at 5.8  Recent history:   Non-insulin hypoglycemic drugs: Metformin ER 750 mg, 2 tablets daily at dinnertime, Trulicity 3.84 mg weekly, Amaryl 2 mg daily  His A1c is higher than his last visit at 7% 6  Current blood sugar patterns and problems identified:  He has again been irregular with his treatment regimen  He was told to go up to 1.5 mg on the Trulicity and he did try taking 2 of the 0.75 doses when he had the prescription  However about a month or so ago he ran out and he did not take any injections until yesterday  He says he tends to forget the weekly injection periodically also  He does not think he has any change in his satiety level with increasing the dose  However most of  his difficulties with excessive appetite appear to be only for a period of about 2 weeks and this happens every couple months or so  He continues to take Amaryl and metformin  As before he is very erratic with checking his blood sugar with starting to do it only yesterday  Lab glucose 148.  He does try to exercise recently with using his bike  His weight is about the same or slightly higher   Side effects from medications have been: None      Compliance with the medical regimen:  Fair, recently worse   Glucose monitoring:  done every 2 to 3 days      Glucometer:    Accu-Chek Blood Glucose readings:   Last 3 readings 165-220 with some readings after meals  Self-care: The diet that the patient has been following is: No specific diet Diet history:  Typical breakfast is an egg,  with bread   Physical activity: exercise:  some walking      Dietician visit: Most recent: 5/14, does not want to pay for this again.      Weight history: Usually ranging from 290-305  Wt Readings from Last 3 Encounters:  08/28/18 297 lb 9.6 oz (135 kg)  07/12/18 299 lb (135.6 kg)  03/27/18 (!) 300 lb 12.8 oz (136.4 kg)    Diabetes control:  Lab Results  Component Value Date   HGBA1C 7.0 (H) 02/05/2019   HGBA1C 6.6 (H) 10/29/2018   HGBA1C 7.9 (H) 07/02/2018   Lab Results  Component Value Date   MICROALBUR 0.8 02/05/2019   LDLCALC 88 02/05/2019   CREATININE 1.03 02/05/2019    Lab on 02/05/2019  Component Date Value Ref Range Status  . Cholesterol 02/05/2019 144  0 - 200 mg/dL Final   ATP III Classification       Desirable:  < 200 mg/dL               Borderline High:  200 - 239 mg/dL          High:  > = 240 mg/dL  . Triglycerides 02/05/2019 144.0  0.0 - 149.0 mg/dL Final   Normal:  <150 mg/dLBorderline High:  150 - 199 mg/dL  . HDL 02/05/2019 27.20* >39.00 mg/dL Final  . VLDL 02/05/2019 28.8  0.0 - 40.0 mg/dL Final  . LDL Cholesterol 02/05/2019 88  0 - 99 mg/dL Final  . Total CHOL/HDL  Ratio 02/05/2019 5   Final                  Men          Women1/2 Average Risk     3.4          3.3Average Risk          5.0          4.42X Average Risk          9.6          7.13X Average Risk          15.0          11.0                      . NonHDL 02/05/2019 116.35   Final   NOTE:  Non-HDL goal should be 30 mg/dL higher than patient's LDL goal (i.e. LDL goal of < 70 mg/dL, would have non-HDL goal of < 100 mg/dL)  . Microalb, Ur 02/05/2019 0.8  0.0 - 1.9 mg/dL Final  . Creatinine,U 02/05/2019 142.1  mg/dL Final  . Microalb Creat Ratio 02/05/2019 0.6  0.0 - 30.0 mg/g Final  . Sodium 02/05/2019 138  135 - 145 mEq/L Final  . Potassium 02/05/2019 3.9  3.5 - 5.1 mEq/L Final  . Chloride 02/05/2019 105  96 - 112 mEq/L Final  . CO2 02/05/2019 25  19 - 32 mEq/L Final  . Glucose, Bld 02/05/2019 148* 70 - 99 mg/dL Final  . BUN 02/05/2019 16  6 - 23 mg/dL Final  . Creatinine, Ser 02/05/2019 1.03  0.40 - 1.50 mg/dL Final  . Total Bilirubin 02/05/2019 0.7  0.2 - 1.2 mg/dL Final  . Alkaline Phosphatase 02/05/2019 62  39 - 117 U/L Final  . AST 02/05/2019 18  0 - 37 U/L Final  . ALT 02/05/2019 31  0 - 53 U/L Final  . Total Protein 02/05/2019 6.7  6.0 - 8.3 g/dL Final  . Albumin 02/05/2019 4.2  3.5 - 5.2 g/dL Final  . Calcium 02/05/2019 9.3  8.4 - 10.5 mg/dL Final  . GFR 02/05/2019 72.82  >60.00 mL/min Final  . Hgb A1c MFr Bld 02/05/2019 7.0* 4.6 - 6.5 % Final   Glycemic Control Guidelines for People with Diabetes:Non Diabetic:  <6%Goal of Therapy: <7%Additional Action Suggested:  >8%     Allergies as of 02/08/2019   No Known  Allergies     Medication List       Accurate as of February 08, 2019 10:21 AM. If you have any questions, ask your nurse or doctor.        Accu-Chek Guide test strip Generic drug: glucose blood USE TO CHECK BLOOD SUGAR ONCE DAILY.**E11.65**   Accu-Chek Guide w/Device Kit 1 each by Does not apply route daily. Use glucose meter to check blood sugar as prescribed.   aspirin  81 MG tablet Take 1 tablet (81 mg total) by mouth daily. STOP TAKING AND RESTART Dec 29 2011 What changed: additional instructions   atorvastatin 40 MG tablet Commonly known as: LIPITOR Take 40 mg by mouth daily.   Dulaglutide 0.75 MG/0.5ML Sopn Commonly known as: Trulicity Inject in the abdominal skin as directed once a week   fenofibrate 145 MG tablet Commonly known as: TRICOR Take 145 mg by mouth daily.   glimepiride 2 MG tablet Commonly known as: AMARYL Take 0.5 tablets (1 mg total) by mouth daily after supper.   losartan 100 MG tablet Commonly known as: COZAAR Take by mouth.   metFORMIN 750 MG 24 hr tablet Commonly known as: GLUCOPHAGE-XR Take 2 tablets (1,500 mg total) by mouth daily with breakfast. What changed: when to take this   OneTouch Delica Lancets 27P Misc USE TO CHECK BLOOD SUGAR ONCE A DAY DX CODE E11.65       Allergies: No Known Allergies  Past Medical History:  Diagnosis Date  . Diabetes mellitus without complication ([place])   . Hyperlipidemia   . Hypertension   . Personal history of adenomatous colonic polyps 01/19/2011  . Sleep apnea    not using CPAP  . Ulcer    30 years ago    Past Surgical History:  Procedure Laterality Date  . COLONOSCOPY  12/14/2011   Procedure: COLONOSCOPY;  Surgeon: Gatha Mayer, MD;  Location: [facility];  Service: Endoscopy;  Laterality: N/A;  colon wiht APC for follow up of colon polyps  . COLONOSCOPY N/A 10/10/2012   Procedure: COLONOSCOPY;  Surgeon: Gatha Mayer, MD;  Location: [facility];  Service: Endoscopy;  Laterality: N/A;  Need APC  . COLONOSCOPY    . COLONOSCOPY W/ POLYPECTOMY  01/13/11   3 polyps, largest 3 cm cecal polyp - adenomas  . HOT HEMOSTASIS  12/14/2011   Procedure: HOT HEMOSTASIS (ARGON PLASMA COAGULATION/BICAP);  Surgeon: Gatha Mayer, MD;  Location: [facility];  Service: Endoscopy;  Laterality: N/A;  . POLYPECTOMY      Family History  Problem Relation Age of Onset  . Colon  cancer Mother 102  . Heart disease Mother   . Heart disease Father   . Cancer - Other Daughter        glioblastoma- died age 37   . Obesity Neg Hx   . Sleep apnea Neg Hx   . Stroke Neg Hx   . Heart attack Neg Hx   . Colon polyps Neg Hx   . Rectal cancer Neg Hx   . Stomach cancer Neg Hx   . Esophageal cancer Neg Hx     Social History:  reports that he has never smoked. He has never used smokeless tobacco. He reports current alcohol use. He reports that he does not use drugs.    Review of Systems       Lipids: Has been on Lipitor for over 10 years for hypercholesterolemia,  followed by PCP. Triglycerides are controlled with fenofibrate LDL is controlled but his HDL has  been consistently low    Lab Results  Component Value Date   CHOL 144 02/05/2019   HDL 27.20 (L) 02/05/2019   LDLCALC 88 02/05/2019   TRIG 144.0 02/05/2019   CHOLHDL 5 02/05/2019           Has history of sleep apnea with variable symptoms  HYPERTENSION: He is on losartan 100 mg  Does not monitor at home, he did check 1 time with the family members monitor and it was reportedly normal  BP Readings from Last 3 Encounters:  08/28/18 128/74  07/12/18 (!) 142/70  03/27/18 128/72     Last foot exam in 2/19   Physical Examination:  There were no vitals taken for this visit.   ASSESSMENT:  Diabetes type 2, With obesity    See history of present illness for discussion of current diabetes management, blood sugar patterns and problems identified  His A1c is relatively higher at 7  This is despite his not taking Trulicity for a month  However may be benefiting with his control with 1.5 mg dose which he can tolerate generally except for the first couple of days after the injection He also has difficulty with excessive appetite which is occurring randomly and not related to anxiety or stress apparently He has only recently exercise but not losing weight Also very erratic with checking his blood sugar   He forgets to take his Trulicity periodically because of leaving it in the refrigerator   HYPERTENSION: His blood pressure has been controlled with losartan but needs to start checking at home and he will do so  LIPIDS: Well controlled    PLAN:  Continue 1.5 mg Trulicity He can leave his Trulicity pen with his oral medication so he can take it regularly He currently does not want to try Rybelsus because of having to take it in the morning Continue increasing exercise Consistent diet Check blood sugars regularly  There are no Patient Instructions on file for this visit.   Elayne Snare 02/08/2019, 10:21 AM

## 2019-05-07 ENCOUNTER — Telehealth: Payer: Self-pay | Admitting: Endocrinology

## 2019-05-07 ENCOUNTER — Other Ambulatory Visit: Payer: Self-pay

## 2019-05-07 MED ORDER — METFORMIN HCL ER 750 MG PO TB24: 1500.0000 mg | ORAL_TABLET | Freq: Every day | ORAL | 3 refills | Status: DC

## 2019-05-07 NOTE — Telephone Encounter (Signed)
Rx sent 

## 2019-05-07 NOTE — Telephone Encounter (Signed)
MEDICATION: Metforman XR  750 MG 2x a day  PHARMACY:  [facility] [place] :   IS PATIENT OUT OF MEDICATION:   IF NOT; HOW MUCH IS LEFT: days worth left  LAST APPOINTMENT DATE: @7 /05/2019  NEXT APPOINTMENT DATE:@10 /07/2019  DO WE HAVE YOUR PERMISSION TO LEAVE A DETAILED MESSAGE: yes, [phone]  OTHER COMMENTS:    **Let patient know to contact pharmacy at the end of the day to make sure medication is ready. **  ** Please notify patient to allow 48-72 hours to process**  **Encourage patient to contact the pharmacy for refills or they can request refills through [facility]**

## 2019-05-13 ENCOUNTER — Other Ambulatory Visit (INDEPENDENT_AMBULATORY_CARE_PROVIDER_SITE_OTHER): Payer: BC Managed Care – PPO

## 2019-05-13 ENCOUNTER — Other Ambulatory Visit: Payer: Self-pay

## 2019-05-13 DIAGNOSIS — E1165 Type 2 diabetes mellitus with hyperglycemia: Secondary | ICD-10-CM

## 2019-05-13 LAB — HEMOGLOBIN A1C: Hgb A1c MFr Bld: 7.8 % — ABNORMAL HIGH (ref 4.6–6.5)

## 2019-05-13 LAB — COMPREHENSIVE METABOLIC PANEL
ALT: 27 U/L (ref 0–53)
AST: 15 U/L (ref 0–37)
Albumin: 4 g/dL (ref 3.5–5.2)
Alkaline Phosphatase: 66 U/L (ref 39–117)
BUN: 17 mg/dL (ref 6–23)
CO2: 29 mEq/L (ref 19–32)
Calcium: 9.9 mg/dL (ref 8.4–10.5)
Chloride: 106 mEq/L (ref 96–112)
Creatinine, Ser: 1.13 mg/dL (ref 0.40–1.50)
GFR: 65.38 mL/min (ref >60.00)
Glucose, Bld: 189 mg/dL — ABNORMAL HIGH (ref 70–99)
Potassium: 4.2 mEq/L (ref 3.5–5.1)
Sodium: 140 mEq/L (ref 135–145)
Total Bilirubin: 0.5 mg/dL (ref 0.2–1.2)
Total Protein: 6.5 g/dL (ref 6.0–8.3)

## 2019-05-16 ENCOUNTER — Other Ambulatory Visit: Payer: Self-pay

## 2019-05-16 ENCOUNTER — Ambulatory Visit (INDEPENDENT_AMBULATORY_CARE_PROVIDER_SITE_OTHER): Payer: BC Managed Care – PPO | Admitting: Endocrinology

## 2019-05-16 ENCOUNTER — Encounter: Payer: Self-pay | Admitting: Endocrinology

## 2019-05-16 DIAGNOSIS — E1165 Type 2 diabetes mellitus with hyperglycemia: Secondary | ICD-10-CM

## 2019-05-16 NOTE — Progress Notes (Signed)
Patient ID: Mark White, male   DOB: 03-Feb-1956, 63 y.o.   MRN: [id number]   Today's office visit was provided via telemedicine using video technique Explained to the patient and the the limitations of evaluation and management by telemedicine and the availability of in person appointments.  The patient understood the limitations and agreed to proceed. Patient also understood that the telehealth visit is billable. . Location of the patient: Home . Location of the provider: Office Only the patient and myself were participating in the encounter    Reason for Appointment : Followup for Type 2 Diabetes  History of Present Illness          Diagnosis: Type 2 diabetes mellitus, date of diagnosis:   2013        Past history:  At diagnosis he was having borderline hyperglycemia. He had previously been taking metformin from his PCP. He was prescribed this twice a day but would tend to forget and take it once a day.  His A1c in early 2014 was 7.2   Because of his relatively high A1c of 7.5 and obesity he was started on Trulicity 1.53 mg weekly in 06/2013 Previously in 09/2014 he had made substantial changes in his diet and lost over 20 pounds His A1c with this was also much better than usual at 5.8  Recent history:   Non-insulin hypoglycemic drugs: Metformin ER 750 mg, 2 tablets daily at dinnertime, Amaryl 2 mg daily  His A1c is higher than his last visit at 7.8, has been as low as 6.6  Current blood sugar patterns and problems identified:  He has again stopped Trulicity at least 4 weeks ago  He says that for a couple of days after he takes this injection he feels tired and listless even with the lower dose of 0.75 mg  As before has difficulty losing weight and he thinks this is about the same  He continues to take Amaryl and metformin  At times he has done some biking for exercise but not consistently  Diet has been inconsistent also, sometimes eating sweets and  carbohydrates  Does have a couple of readings over 200 at home  His only fasting reading was high at 155  Glucose 189 nonfasting   Side effects from medications have been: None      Compliance with the medical regimen:  Fair, recently worse   Glucose monitoring:  done every 2 to 3 days      Glucometer:    Accu-Chek Blood Glucose readings:   Recent range 99-226 Blood sugars are high except midday and afternoon  Self-care: The diet that the patient has been following is: No specific diet Diet history:  Typical breakfast is an egg,  with bread   Physical activity: exercise: See above      Dietician visit: Most recent: 5/14, does not want to pay for this again.      Weight history: Usually ranging from 290-305  Wt Readings from Last 3 Encounters:  08/28/18 297 lb 9.6 oz (135 kg)  07/12/18 299 lb (135.6 kg)  03/27/18 (!) 300 lb 12.8 oz (136.4 kg)    Diabetes control:    Lab Results  Component Value Date   HGBA1C 7.8 (H) 05/13/2019   HGBA1C 7.0 (H) 02/05/2019   HGBA1C 6.6 (H) 10/29/2018   Lab Results  Component Value Date   MICROALBUR 0.8 02/05/2019   LDLCALC 88 02/05/2019   CREATININE 1.13 05/13/2019    Lab on 05/13/2019  Component Date Value Ref Range Status  . Sodium 05/13/2019 140  135 - 145 mEq/L Final  . Potassium 05/13/2019 4.2  3.5 - 5.1 mEq/L Final  . Chloride 05/13/2019 106  96 - 112 mEq/L Final  . CO2 05/13/2019 29  19 - 32 mEq/L Final  . Glucose, Bld 05/13/2019 189* 70 - 99 mg/dL Final  . BUN 05/13/2019 17  6 - 23 mg/dL Final  . Creatinine, Ser 05/13/2019 1.13  0.40 - 1.50 mg/dL Final  . Total Bilirubin 05/13/2019 0.5  0.2 - 1.2 mg/dL Final  . Alkaline Phosphatase 05/13/2019 66  39 - 117 U/L Final  . AST 05/13/2019 15  0 - 37 U/L Final  . ALT 05/13/2019 27  0 - 53 U/L Final  . Total Protein 05/13/2019 6.5  6.0 - 8.3 g/dL Final  . Albumin 05/13/2019 4.0  3.5 - 5.2 g/dL Final  . Calcium 05/13/2019 9.9  8.4 - 10.5 mg/dL Final  . GFR 05/13/2019 65.38   >60.00 mL/min Final  . Hgb A1c MFr Bld 05/13/2019 7.8* 4.6 - 6.5 % Final   Glycemic Control Guidelines for People with Diabetes:Non Diabetic:  <6%Goal of Therapy: <7%Additional Action Suggested:  >8%     Allergies as of 05/16/2019   No Known Allergies     Medication List       Accurate as of May 16, 2019 10:31 AM. If you have any questions, ask your nurse or doctor.        STOP taking these medications   Trulicity 1.5 FG/1.8EX Sopn Generic drug: Dulaglutide Stopped by: Elayne Snare, MD     TAKE these medications   Accu-Chek Guide test strip Generic drug: glucose blood USE TO CHECK BLOOD SUGAR ONCE DAILY.**E11.65**   Accu-Chek Guide w/Device Kit 1 each by Does not apply route daily. Use glucose meter to check blood sugar as prescribed.   aspirin 81 MG tablet Take 1 tablet (81 mg total) by mouth daily. STOP TAKING AND RESTART Dec 29 2011 What changed: additional instructions   atorvastatin 40 MG tablet Commonly known as: LIPITOR Take 40 mg by mouth daily.   fenofibrate 145 MG tablet Commonly known as: TRICOR Take 145 mg by mouth daily.   glimepiride 2 MG tablet Commonly known as: AMARYL Take 0.5 tablets (1 mg total) by mouth daily after supper.   losartan 100 MG tablet Commonly known as: COZAAR Take by mouth.   metFORMIN 750 MG 24 hr tablet Commonly known as: GLUCOPHAGE-XR Take 2 tablets (1,500 mg total) by mouth daily with supper.   OneTouch Delica Lancets 93Z Misc USE TO CHECK BLOOD SUGAR ONCE A DAY DX CODE E11.65       Allergies: No Known Allergies  Past Medical History:  Diagnosis Date  . Diabetes mellitus without complication ([place])   . Hyperlipidemia   . Hypertension   . Personal history of adenomatous colonic polyps 01/19/2011  . Sleep apnea    not using CPAP  . Ulcer    30 years ago    Past Surgical History:  Procedure Laterality Date  . COLONOSCOPY  12/14/2011   Procedure: COLONOSCOPY;  Surgeon: Gatha Mayer, MD;  Location: [facility];  Service: Endoscopy;  Laterality: N/A;  colon wiht APC for follow up of colon polyps  . COLONOSCOPY N/A 10/10/2012   Procedure: COLONOSCOPY;  Surgeon: Gatha Mayer, MD;  Location: [facility];  Service: Endoscopy;  Laterality: N/A;  Need APC  . COLONOSCOPY    . COLONOSCOPY W/ POLYPECTOMY  01/13/11   3 polyps, largest 3 cm cecal polyp - adenomas  . HOT HEMOSTASIS  12/14/2011   Procedure: HOT HEMOSTASIS (ARGON PLASMA COAGULATION/BICAP);  Surgeon: Gatha Mayer, MD;  Location: [facility];  Service: Endoscopy;  Laterality: N/A;  . POLYPECTOMY      Family History  Problem Relation Age of Onset  . Colon cancer Mother 70  . Heart disease Mother   . Heart disease Father   . Cancer - Other Daughter        glioblastoma- died age 68   . Obesity Neg Hx   . Sleep apnea Neg Hx   . Stroke Neg Hx   . Heart attack Neg Hx   . Colon polyps Neg Hx   . Rectal cancer Neg Hx   . Stomach cancer Neg Hx   . Esophageal cancer Neg Hx     Social History:  reports that he has never smoked. He has never used smokeless tobacco. He reports current alcohol use. He reports that he does not use drugs.    Review of Systems       Lipids: Has been on Lipitor for over 10 years for hypercholesterolemia,  followed by PCP. Triglycerides are controlled with fenofibrate LDL is controlled but his HDL has been consistently low    Lab Results  Component Value Date   CHOL 144 02/05/2019   HDL 27.20 (L) 02/05/2019   LDLCALC 88 02/05/2019   TRIG 144.0 02/05/2019   CHOLHDL 5 02/05/2019           Has history of sleep apnea with variable symptoms  HYPERTENSION: He is on losartan 100 mg  Does not monitor at home, has not had blood pressure check with PCP  BP Readings from Last 3 Encounters:  08/28/18 128/74  07/12/18 (!) 142/70  03/27/18 128/72     Last foot exam in 2/19   Physical Examination:  There were no vitals taken for this visit.   ASSESSMENT:  Diabetes type 2, With obesity     See history of present illness for discussion of current diabetes management, blood sugar patterns and problems identified  His A1c is relatively higher at 7.8  Again has not taken Trulicity for a month  He does not want to continue Trulicity even though it appears to generally control his blood sugars He thinks he has significant malaise for couple of days after taking it Also can do better with diet and exercise  Weight is about the same   HYPERTENSION: His blood pressure has been controlled with losartan but needs to be seen in the office to make sure the blood pressure is controlled    PLAN:  Trial of Rybelsus Discussed how this is taken, benefits and possible side effects He will be given a sample of 3 mg to take before breakfast daily He will call after 3 weeks to report his progress and if tolerated will go up to 7 mg  Follow-up in the office in 2 months  There are no Patient Instructions on file for this visit.   Elayne Snare 05/16/2019, 10:31 AM

## 2019-06-07 ENCOUNTER — Other Ambulatory Visit: Payer: Self-pay | Admitting: Endocrinology

## 2019-06-19 ENCOUNTER — Other Ambulatory Visit: Payer: Self-pay

## 2019-06-19 ENCOUNTER — Telehealth: Payer: Self-pay

## 2019-06-19 MED ORDER — RYBELSUS 7 MG PO TABS: 7.0000 mg | ORAL_TABLET | Freq: Every day | ORAL | 1 refills | Status: DC

## 2019-06-19 NOTE — Telephone Encounter (Signed)
Called pt and left detailed voicemail with MD message. Rx sent.

## 2019-06-19 NOTE — Telephone Encounter (Signed)
After taking the 3 mg for 30 days he will take 7 mg in the morning.  Also needs to make his appointment for follow-up in about 6 weeks with labs

## 2019-06-19 NOTE — Telephone Encounter (Signed)
Patient called in stating Dr gave him samples of Rybelsus 3mg  per tablet to take. Patient states he likes the medication and would like a script sent to pharmacy    [id number] - [place], [address]

## 2019-06-19 NOTE — Telephone Encounter (Signed)
Send 3mg  tabs or 7?

## 2019-07-11 DIAGNOSIS — E785 Hyperlipidemia, unspecified: Secondary | ICD-10-CM | POA: Diagnosis not present

## 2019-07-11 DIAGNOSIS — E1169 Type 2 diabetes mellitus with other specified complication: Secondary | ICD-10-CM | POA: Diagnosis not present

## 2019-07-11 DIAGNOSIS — E1159 Type 2 diabetes mellitus with other circulatory complications: Secondary | ICD-10-CM | POA: Diagnosis not present

## 2019-07-11 DIAGNOSIS — I10 Essential (primary) hypertension: Secondary | ICD-10-CM | POA: Diagnosis not present

## 2019-08-02 HISTORY — PX: COLONOSCOPY: SHX174

## 2019-08-04 ENCOUNTER — Other Ambulatory Visit: Payer: Self-pay | Admitting: Endocrinology

## 2019-08-06 ENCOUNTER — Ambulatory Visit: Payer: BC Managed Care – PPO | Admitting: Endocrinology

## 2019-08-06 ENCOUNTER — Other Ambulatory Visit (INDEPENDENT_AMBULATORY_CARE_PROVIDER_SITE_OTHER): Payer: BC Managed Care – PPO

## 2019-08-06 DIAGNOSIS — E1165 Type 2 diabetes mellitus with hyperglycemia: Secondary | ICD-10-CM

## 2019-08-06 LAB — GLUCOSE, RANDOM: Glucose, Bld: 111 mg/dL — ABNORMAL HIGH (ref 70–99)

## 2019-08-07 LAB — FRUCTOSAMINE: Fructosamine: 216 umol/L (ref 0–285)

## 2019-08-12 ENCOUNTER — Other Ambulatory Visit: Payer: Self-pay | Admitting: Endocrinology

## 2019-08-12 ENCOUNTER — Ambulatory Visit (INDEPENDENT_AMBULATORY_CARE_PROVIDER_SITE_OTHER): Payer: BC Managed Care – PPO | Admitting: Endocrinology

## 2019-08-12 ENCOUNTER — Encounter: Payer: Self-pay | Admitting: Endocrinology

## 2019-08-12 ENCOUNTER — Other Ambulatory Visit: Payer: Self-pay

## 2019-08-12 DIAGNOSIS — E1165 Type 2 diabetes mellitus with hyperglycemia: Secondary | ICD-10-CM | POA: Diagnosis not present

## 2019-08-12 DIAGNOSIS — E782 Mixed hyperlipidemia: Secondary | ICD-10-CM

## 2019-08-12 NOTE — Progress Notes (Signed)
  Patient ID: Mark White, male   DOB: 10/08/1955, 64 y.o.   MRN: [id number]   Today's office visit was provided via telemedicine using video technique Explained to the patient and the the limitations of evaluation and management by telemedicine and the availability of in person appointments.  The patient understood the limitations and agreed to proceed. Patient also understood that the telehealth visit is billable. . Location of the patient: Home . Location of the provider: Office Only the patient and myself were participating in the encounter    Reason for Appointment : Followup for Type 2 Diabetes  History of Present Illness          Diagnosis: Type 2 diabetes mellitus, date of diagnosis:   2013        Past history:  At diagnosis he was having borderline hyperglycemia. He had previously been taking metformin from his PCP. He was prescribed this twice a day but would tend to forget and take it once a day.  His A1c in early 2014 was 7.2   Because of his relatively high A1c of 7.5 and obesity he was started on Trulicity 0.75 mg weekly in 06/2013 Previously in 09/2014 he had made substantial changes in his diet and lost over 20 pounds His A1c with this was also much better than usual at 5.8  Recent history:   Non-insulin hypoglycemic drugs: Metformin ER 750 mg, 2 tablets daily at dinnertime, Amaryl 2 mg daily, Rybelsus 7 mg daily  His A1c was higher on last visit at 7.8 and with his PCP was 7.4 in December This, has been as low as 6.6  Fructosamine is 216  Current blood sugar patterns and problems identified:  He has been able to tolerate Rybelsus better than Trulicity which was making him feel tired and nauseated  Because of his high A1c in December he started improving his diet and cutting back on carbohydrates and portions  With this he thinks he has lost about 8 pounds and his weight is now about 291  However he checks his blood sugars very infrequently and has  only 2 readings on his monitor over the last month which are about 128  Lab glucose 111 and he thinks fasting glucose was 102 at home today  He takes his Rybelsus consistently about 30-minute before breakfast  This does help him somewhat with satiety  As before he is not able to do much exercise and does not like to go out in the cold to walk   Side effects from medications have been: Fatigue and nausea from Trulicity  Glucose monitoring:  done every 2 to 3 days      Glucometer:    Accu-Chek Blood Glucose readings: As above  Self-care: The diet that the patient has been following is: No specific diet Diet history:  Typical breakfast is an egg,  with bread   Physical activity: exercise: See above      Dietician visit: Most recent: 5/14, does not want to pay for this again.      Weight history: Usually ranging from 290-305  Wt Readings from Last 3 Encounters:  08/28/18 297 lb 9.6 oz (135 kg)  07/12/18 299 lb (135.6 kg)  03/27/18 (!) 300 lb 12.8 oz (136.4 kg)    Diabetes control:    Lab Results  Component Value Date   HGBA1C 7.8 (H) 05/13/2019   HGBA1C 7.0 (H) 02/05/2019   HGBA1C 6.6 (H) 10/29/2018   Lab Results  Component Value Date     MICROALBUR 0.8 02/05/2019   LDLCALC 88 02/05/2019   CREATININE 1.13 05/13/2019    Lab on 08/06/2019  Component Date Value Ref Range Status  . Glucose, Bld 08/06/2019 111* 70 - 99 mg/dL Final  . Fructosamine 08/06/2019 216  0 - 285 umol/L Final   Comment: Published reference interval for apparently healthy subjects between age 60 and 76 is 73 - 285 umol/L and in a poorly controlled diabetic population is 228 - 563 umol/L with a mean of 396 umol/L.     Allergies as of 08/12/2019   No Known Allergies     Medication List       Accurate as of August 12, 2019 11:24 AM. If you have any questions, ask your nurse or doctor.        STOP taking these medications   fenofibrate 145 MG tablet Commonly known as: TRICOR Stopped by:  Elayne Snare, MD     TAKE these medications   Accu-Chek Guide test strip Generic drug: glucose blood USE TO CHECK BLOOD SUGAR ONCE DAILY.**E11.65**   Accu-Chek Guide w/Device Kit 1 each by Does not apply route daily. Use glucose meter to check blood sugar as prescribed.   aspirin 81 MG tablet Take 1 tablet (81 mg total) by mouth daily. STOP TAKING AND RESTART Dec 29 2011 What changed: additional instructions   atorvastatin 40 MG tablet Commonly known as: LIPITOR Take 40 mg by mouth daily.   glimepiride 2 MG tablet Commonly known as: AMARYL Take 0.5 tablets (1 mg total) by mouth daily after supper.   losartan 100 MG tablet Commonly known as: COZAAR Take by mouth.   metFORMIN 750 MG 24 hr tablet Commonly known as: GLUCOPHAGE-XR Take 2 tablets (1,500 mg total) by mouth daily with supper.   OneTouch Delica Plus [id number] Misc USE TO CHECK BLOOD SUGAR ONCE A DAY DX CODE E11.65   Rybelsus 7 MG Tabs Generic drug: Semaglutide TAKE 7 MG BY MOUTH DAILY.       Allergies: No Known Allergies  Past Medical History:  Diagnosis Date  . Diabetes mellitus without complication ([place])   . Hyperlipidemia   . Hypertension   . Personal history of adenomatous colonic polyps 01/19/2011  . Sleep apnea    not using CPAP  . Ulcer    30 years ago    Past Surgical History:  Procedure Laterality Date  . COLONOSCOPY  12/14/2011   Procedure: COLONOSCOPY;  Surgeon: Gatha Mayer, MD;  Location: [facility];  Service: Endoscopy;  Laterality: N/A;  colon wiht APC for follow up of colon polyps  . COLONOSCOPY N/A 10/10/2012   Procedure: COLONOSCOPY;  Surgeon: Gatha Mayer, MD;  Location: [facility];  Service: Endoscopy;  Laterality: N/A;  Need APC  . COLONOSCOPY    . COLONOSCOPY W/ POLYPECTOMY  01/13/11   3 polyps, largest 3 cm cecal polyp - adenomas  . HOT HEMOSTASIS  12/14/2011   Procedure: HOT HEMOSTASIS (ARGON PLASMA COAGULATION/BICAP);  Surgeon: Gatha Mayer, MD;  Location: [facility];  Service: Endoscopy;  Laterality: N/A;  . POLYPECTOMY      Family History  Problem Relation Age of Onset  . Colon cancer Mother 79  . Heart disease Mother   . Heart disease Father   . Cancer - Other Daughter        glioblastoma- died age 13   . Obesity Neg Hx   . Sleep apnea Neg Hx   . Stroke Neg Hx   . Heart attack Neg Hx   .  Colon polyps Neg Hx   . Rectal cancer Neg Hx   . Stomach cancer Neg Hx   . Esophageal cancer Neg Hx     Social History:  reports that he has never smoked. He has never used smokeless tobacco. He reports current alcohol use. He reports that he does not use drugs.    Review of Systems       Lipids: Has been on Lipitor for over 10 years for hypercholesterolemia,  followed by PCP. Triglycerides had been controlled with fenofibrate However because of lack of availability at the pharmacy he has not taken this for some time and does not remember how long Triglycerides in December were normal with PCP  LDL is controlled but his HDL has been consistently low    Lab Results  Component Value Date   CHOL 144 02/05/2019   HDL 27.20 (L) 02/05/2019   LDLCALC 88 02/05/2019   TRIG 144.0 02/05/2019   CHOLHDL 5 02/05/2019           Has history of sleep apnea with variable symptoms  HYPERTENSION: He is on losartan 100 mg  Does not monitor at home but his blood pressure was 157 systolic with his PCP in December, no changes were made  BP Readings from Last 3 Encounters:  08/28/18 128/74  07/12/18 (!) 142/70  03/27/18 128/72     Last foot exam done with PCP   Physical Examination:  There were no vitals taken for this visit.   ASSESSMENT:  Diabetes type 2, With obesity    See history of present illness for discussion of current diabetes management, blood sugar patterns and problems identified  His A1c is relatively higher at 7.4  He has done much better with Rybelsus and improving his diet and blood sugars recently look near normal  Fructosamine is 216 indicating good recent control However will need to check his A1c on the next visit Also can do better with exercise   HYPERTENSION: His blood pressure has been treated with losartan but his blood pressure was high with his PCP He can start checking blood pressure at home and he says he will go and purchase a blood pressure meter  LIPIDS: Checked by PCP in December and were adequate even though he thinks he may not have been on fenofibrate at that time, continue Lipitor   PLAN:  For diabetes: Continue 7 mg Rybelsus Start checking blood sugars regularly including after meals Encouraged him to start upper body exercises regularly at home He should try and continue improving his diet and aim for further weight loss  For other problems see above  Follow-up in the office in 2 months for A1c  There are no Patient Instructions on file for this visit.   Ajay Kumar 08/12/2019, 11:24 AM    

## 2019-08-19 ENCOUNTER — Encounter: Payer: Self-pay | Admitting: Internal Medicine

## 2019-08-20 ENCOUNTER — Telehealth: Payer: Self-pay | Admitting: Internal Medicine

## 2019-08-20 NOTE — Telephone Encounter (Signed)
LM on Vmail to call back to schedule his recall colonoscopy per Dr. Celesta Aver recall assessment

## 2019-10-08 ENCOUNTER — Encounter: Payer: Self-pay | Admitting: Internal Medicine

## 2019-10-10 DIAGNOSIS — E1169 Type 2 diabetes mellitus with other specified complication: Secondary | ICD-10-CM | POA: Diagnosis not present

## 2019-10-10 DIAGNOSIS — E785 Hyperlipidemia, unspecified: Secondary | ICD-10-CM | POA: Diagnosis not present

## 2019-10-15 ENCOUNTER — Other Ambulatory Visit: Payer: Self-pay | Admitting: Endocrinology

## 2019-10-17 DIAGNOSIS — H35033 Hypertensive retinopathy, bilateral: Secondary | ICD-10-CM | POA: Diagnosis not present

## 2019-10-17 DIAGNOSIS — E119 Type 2 diabetes mellitus without complications: Secondary | ICD-10-CM | POA: Diagnosis not present

## 2019-10-17 DIAGNOSIS — H2513 Age-related nuclear cataract, bilateral: Secondary | ICD-10-CM | POA: Diagnosis not present

## 2019-10-17 DIAGNOSIS — H524 Presbyopia: Secondary | ICD-10-CM | POA: Diagnosis not present

## 2019-11-05 ENCOUNTER — Ambulatory Visit (AMBULATORY_SURGERY_CENTER): Payer: Self-pay | Admitting: *Deleted

## 2019-11-05 ENCOUNTER — Other Ambulatory Visit: Payer: Self-pay

## 2019-11-05 VITALS — Temp 96.9°F | Ht 72.0 in | Wt 297.0 lb

## 2019-11-05 DIAGNOSIS — Z86010 Personal history of colonic polyps: Secondary | ICD-10-CM

## 2019-11-05 DIAGNOSIS — Z800 Family history of malignant neoplasm of digestive organs: Secondary | ICD-10-CM

## 2019-11-05 MED ORDER — SUPREP BOWEL PREP KIT 17.5-3.13-1.6 GM/177ML PO SOLN: 1.0000 | Freq: Once | ORAL | 0 refills | Status: AC

## 2019-11-05 NOTE — Progress Notes (Signed)
Comp covid vaccines 10-14-2019  No egg or soy allergy known to patient  No issues with past sedation with any surgeries  or procedures, no intubation problems  No diet pills per patient No home 02 use per patient  No blood thinners per patient  Pt denies issues with constipation  No A fib or A flutter  EMMI video sent to pt's e mail   Due to the COVID-19 pandemic we are asking patients to follow these guidelines. Please only bring one care partner. Please be aware that your care partner may wait in the car in the parking lot or if they feel like they will be too hot to wait in the car, they may wait in the lobby on the 4th floor. All care partners are required to wear a mask the entire time (we do not have any that we can provide them), they need to practice social distancing, and we will do a Covid check for all patient's and care partners when you arrive. Also we will check their temperature and your temperature. If the care partner waits in their car they need to stay in the parking lot the entire time and we will call them on their cell phone when the patient is ready for discharge so they can bring the car to the front of the building. Also all patient's will need to wear a mask into building.  Suprep code and coupon

## 2019-11-08 ENCOUNTER — Telehealth: Payer: Self-pay | Admitting: Internal Medicine

## 2019-11-08 NOTE — Telephone Encounter (Signed)
Pt is scheduled for a colonoscopy 11/19/19 and stated that insurance does not cover prep soln.

## 2019-11-08 NOTE — Telephone Encounter (Signed)
Pt states prep is $90.  I explained that the Suprep would be better because he only had an adequate prep with Miralax with his last procedure.  I offered him a 2 day Miralax/Miralax prep and he declined.  I told him that I would call him if any samples came in next week

## 2019-11-19 ENCOUNTER — Other Ambulatory Visit: Payer: Self-pay

## 2019-11-19 ENCOUNTER — Encounter: Payer: Self-pay | Admitting: Internal Medicine

## 2019-11-19 ENCOUNTER — Ambulatory Visit (AMBULATORY_SURGERY_CENTER): Payer: BC Managed Care – PPO | Admitting: Internal Medicine

## 2019-11-19 VITALS — BP 129/68 | HR 52 | Temp 97.1°F | Resp 11 | Ht 72.0 in | Wt 297.0 lb

## 2019-11-19 DIAGNOSIS — D123 Benign neoplasm of transverse colon: Secondary | ICD-10-CM | POA: Diagnosis not present

## 2019-11-19 DIAGNOSIS — Z1211 Encounter for screening for malignant neoplasm of colon: Secondary | ICD-10-CM | POA: Diagnosis not present

## 2019-11-19 DIAGNOSIS — D124 Benign neoplasm of descending colon: Secondary | ICD-10-CM

## 2019-11-19 DIAGNOSIS — D120 Benign neoplasm of cecum: Secondary | ICD-10-CM | POA: Diagnosis not present

## 2019-11-19 DIAGNOSIS — Z86010 Personal history of colonic polyps: Secondary | ICD-10-CM | POA: Diagnosis not present

## 2019-11-19 MED ORDER — SODIUM CHLORIDE 0.9 % IV SOLN: 500.0000 mL | Freq: Once | INTRAVENOUS | Status: DC

## 2019-11-19 NOTE — Progress Notes (Signed)
pt tolerated well. VSS. awake and to recovery. Report given to RN.  

## 2019-11-19 NOTE — Progress Notes (Signed)
Temp by JB Vitals by DT  Pt's states no medical or surgical changes since previsit or office visit. 

## 2019-11-19 NOTE — Patient Instructions (Addendum)
I found and removed 5 tiny-small polyps.  I will let you know pathology results and when to have another routine colonoscopy by mail and/or My Chart.  I appreciate the opportunity to care for you. Gatha Mayer, MD, [facility]   Read all of the handouts given to you by your recovery room nurse.  YOU HAD AN ENDOSCOPIC PROCEDURE TODAY AT [facility]:   Refer to the procedure report that was given to you for any specific questions about what was found during the examination.  If the procedure report does not answer your questions, please call your gastroenterologist to clarify.  If you requested that your care partner not be given the details of your procedure findings, then the procedure report has been included in a sealed envelope for you to review at your convenience later.  YOU SHOULD EXPECT: Some feelings of bloating in the abdomen. Passage of more gas than usual.  Walking can help get rid of the air that was put into your GI tract during the procedure and reduce the bloating. If you had a lower endoscopy (such as a colonoscopy or flexible sigmoidoscopy) you may notice spotting of blood in your stool or on the toilet paper. If you underwent a bowel prep for your procedure, you may not have a normal bowel movement for a few days.  Please Note:  You might notice some irritation and congestion in your nose or some drainage.  This is from the oxygen used during your procedure.  There is no need for concern and it should clear up in a day or so.  SYMPTOMS TO REPORT IMMEDIATELY:   Following lower endoscopy (colonoscopy or flexible sigmoidoscopy):  Excessive amounts of blood in the stool  Significant tenderness or worsening of abdominal pains  Swelling of the abdomen that is new, acute  Fever of 100F or higher   For urgent or emergent issues, a gastroenterologist can be reached at any hour by calling [phone]. Do not use MyChart messaging for urgent concerns.    DIET:   We do recommend a small meal at first, but then you may proceed to your regular diet.  Drink plenty of fluids but you should avoid alcoholic beverages for 24 hours.  ACTIVITY:  You should plan to take it easy for the rest of today and you should NOT DRIVE or use heavy machinery until tomorrow (because of the sedation medicines used during the test).    FOLLOW UP: Our staff will call the number listed on your records 48-72 hours following your procedure to check on you and address any questions or concerns that you may have regarding the information given to you following your procedure. If we do not reach you, we will leave a message.  We will attempt to reach you two times.  During this call, we will ask if you have developed any symptoms of COVID 19. If you develop any symptoms (ie: fever, flu-like symptoms, shortness of breath, cough etc.) before then, please call [phone].  If you test positive for Covid 19 in the 2 weeks post procedure, please call and report this information to [place].    If any biopsies were taken you will be contacted by phone or by letter within the next 1-3 weeks.  Please call us at [phone] if you have not heard about the biopsies in 3 weeks.    SIGNATURES/CONFIDENTIALITY: You and/or your care partner have signed paperwork which will be entered into your electronic medical record.  These signatures attest to the fact that that the information above on your After Visit Summary has been reviewed and is understood.  Full responsibility of the confidentiality of this discharge information lies with you and/or your care-partner.

## 2019-11-19 NOTE — Progress Notes (Signed)
Called to room to assist during endoscopic procedure.  Patient ID and intended procedure confirmed with present staff. Received instructions for my participation in the procedure from the performing physician.  

## 2019-11-19 NOTE — Op Note (Signed)
[facility] Patient Name: Mark White Procedure Date: 11/19/2019 10:26 AM MRN: [id number] Endoscopist: Gatha Mayer , MD Age: 64 Referring MD:  Date of Birth: 1955-11-22 Gender: Male Account #: [id number] Procedure:                Colonoscopy Indications:              Surveillance: Personal history of adenomatous                            polyps on last colonoscopy > 3 years ago Medicines:                Propofol per Anesthesia, Monitored Anesthesia Care Procedure:                Pre-Anesthesia Assessment:                           - Prior to the procedure, a History and Physical                            was performed, and patient medications and                            allergies were reviewed. The patient's tolerance of                            previous anesthesia was also reviewed. The risks                            and benefits of the procedure and the sedation                            options and risks were discussed with the patient.                            All questions were answered, and informed consent                            was obtained. Prior Anticoagulants: The patient has                            taken no previous anticoagulant or antiplatelet                            agents. ASA Grade Assessment: III - A patient with                            severe systemic disease. After reviewing the risks                            and benefits, the patient was deemed in                            satisfactory condition to undergo the procedure.  After obtaining informed consent, the colonoscope                            was passed under direct vision. Throughout the                            procedure, the patient's blood pressure, pulse, and                            oxygen saturations were monitored continuously. The                            Colonoscope was introduced through the anus and   advanced to the the cecum, identified by                            appendiceal orifice and ileocecal valve. The                            colonoscopy was somewhat difficult due to                            significant looping. Successful completion of the                            procedure was aided by applying abdominal pressure.                            The patient tolerated the procedure well. The                            quality of the bowel preparation was good. The                            bowel preparation used was Miralax via split dose                            instruction. The ileocecal valve, appendiceal                            orifice, and rectum were photographed. Scope In: 10:44:42 AM Scope Out: 11:05:58 AM Scope Withdrawal Time: 0 hours 15 minutes 21 seconds  Total Procedure Duration: 0 hours 21 minutes 16 seconds  Findings:                 The perianal and digital rectal examinations were                            normal.                           Five sessile polyps were found in the descending                            colon, transverse colon and cecum. The polyps were  diminutive in size. These polyps were removed with                            a cold snare. Resection and retrieval were                            complete. Verification of patient identification                            for the specimen was done. Estimated blood loss was                            minimal.                           The exam was otherwise without abnormality on                            direct and retroflexion views. Complications:            No immediate complications. Estimated Blood Loss:     Estimated blood loss was minimal. Impression:               - Five diminutive polyps in the descending colon,                            in the transverse colon and in the cecum, removed                            with a cold snare. Resected and  retrieved.                           - The examination was otherwise normal on direct                            and retroflexion views.                           - Personal history of colonic polyps. Adenomas and                            FHx CRCA mom at 62 Recommendation:           - Patient has a contact number available for                            emergencies. The signs and symptoms of potential                            delayed complications were discussed with the                            patient. Return to normal activities tomorrow.                            Written  discharge instructions were provided to the                            patient.                           - Resume previous diet.                           - Continue present medications.                           - Repeat colonoscopy is recommended for                            surveillance. The colonoscopy date will be                            determined after pathology results from today's                            exam become available for review. Gatha Mayer, MD 11/19/2019 11:17:17 AM This report has been signed electronically.

## 2019-11-21 ENCOUNTER — Telehealth: Payer: Self-pay

## 2019-11-21 NOTE — Telephone Encounter (Signed)
  Follow up Call-  Call back number 11/19/2019  Post procedure Call Back phone  # [phone]  Permission to leave phone message Yes  Some recent data might be hidden     Patient questions:  Do you have a fever, pain , or abdominal swelling? No. Pain Score  0 *  Have you tolerated food without any problems? Yes.    Have you been able to return to your normal activities? Yes.    Do you have any questions about your discharge instructions: Diet   No. Medications  No. Follow up visit  No.  Do you have questions or concerns about your Care? No.  Actions: * If pain score is 4 or above: No action needed, pain <4.   1. Have you developed a fever since your procedure? No  2.   Have you had an respiratory symptoms (SOB or cough) since your procedure? No  3.   Have you tested positive for COVID 19 since your procedure No  4.   Have you had any family members/close contacts diagnosed with the COVID 19 since your procedure?  No   If yes to any of these questions please route to Joylene John, RN and Erenest Rasher, RN

## 2019-11-26 ENCOUNTER — Encounter: Payer: Self-pay | Admitting: Internal Medicine

## 2019-11-26 DIAGNOSIS — Z86010 Personal history of colonic polyps: Secondary | ICD-10-CM

## 2019-12-20 ENCOUNTER — Other Ambulatory Visit: Payer: Self-pay | Admitting: Endocrinology

## 2020-01-07 ENCOUNTER — Other Ambulatory Visit: Payer: Self-pay

## 2020-01-07 ENCOUNTER — Telehealth: Payer: Self-pay

## 2020-01-07 DIAGNOSIS — E785 Hyperlipidemia, unspecified: Secondary | ICD-10-CM | POA: Diagnosis not present

## 2020-01-07 DIAGNOSIS — E1159 Type 2 diabetes mellitus with other circulatory complications: Secondary | ICD-10-CM | POA: Diagnosis not present

## 2020-01-07 DIAGNOSIS — E1169 Type 2 diabetes mellitus with other specified complication: Secondary | ICD-10-CM | POA: Diagnosis not present

## 2020-01-07 DIAGNOSIS — I152 Hypertension secondary to endocrine disorders: Secondary | ICD-10-CM | POA: Diagnosis not present

## 2020-01-07 DIAGNOSIS — Z125 Encounter for screening for malignant neoplasm of prostate: Secondary | ICD-10-CM | POA: Diagnosis not present

## 2020-01-07 LAB — COMPREHENSIVE METABOLIC PANEL: Calcium: 9.4 (ref 8.7–10.7)

## 2020-01-07 LAB — BASIC METABOLIC PANEL
BUN: 15 (ref 4–21)
Chloride: 105 (ref 99–108)
Creatinine: 1 (ref <=1.3)
Glucose: 148
Potassium: 4.8 (ref 3.4–5.3)
Sodium: 143 (ref 137–147)

## 2020-01-07 LAB — LIPID PANEL
Cholesterol: 125 (ref 0–200)
HDL: 29 — AB (ref 35–70)
LDL Cholesterol: 80
Triglycerides: 77 (ref 40–160)

## 2020-01-07 LAB — HEMOGLOBIN A1C: Hemoglobin A1C: 6.5

## 2020-01-07 MED ORDER — RYBELSUS 7 MG PO TABS: 7.0000 mg | ORAL_TABLET | Freq: Every day | ORAL | 0 refills | Status: DC

## 2020-01-07 MED ORDER — METFORMIN HCL ER 750 MG PO TB24: 1500.0000 mg | ORAL_TABLET | Freq: Every day | ORAL | 0 refills | Status: DC

## 2020-01-07 NOTE — Telephone Encounter (Signed)
Medication Refill Request  Did you call your pharmacy and request this refill first? yes . If patient has not contacted pharmacy first, instruct them to do so for future refills.  . Remind them that contacting the pharmacy for their refill is the quickest method to get the refill.  . Refill policy also stated that it will take anywhere between 24-72 hours to receive the refill.    Name of medication? rybelsus and metformin  Is this a 90 day supply? yes  Name and location of pharmacy?  [id number] - [place], [place] (Phone) [phone] (Fax)     . Is the request for diabetes test strips? no . If yes, what brand?n/a

## 2020-01-07 NOTE — Telephone Encounter (Signed)
Rx sent 

## 2020-01-09 DIAGNOSIS — G4733 Obstructive sleep apnea (adult) (pediatric): Secondary | ICD-10-CM | POA: Diagnosis not present

## 2020-01-09 DIAGNOSIS — E1169 Type 2 diabetes mellitus with other specified complication: Secondary | ICD-10-CM | POA: Diagnosis not present

## 2020-01-09 DIAGNOSIS — E785 Hyperlipidemia, unspecified: Secondary | ICD-10-CM | POA: Diagnosis not present

## 2020-01-09 DIAGNOSIS — E1159 Type 2 diabetes mellitus with other circulatory complications: Secondary | ICD-10-CM | POA: Diagnosis not present

## 2020-01-09 DIAGNOSIS — I152 Hypertension secondary to endocrine disorders: Secondary | ICD-10-CM | POA: Diagnosis not present

## 2020-01-31 ENCOUNTER — Other Ambulatory Visit: Payer: BC Managed Care – PPO

## 2020-02-04 ENCOUNTER — Ambulatory Visit: Payer: BC Managed Care – PPO | Admitting: Endocrinology

## 2020-02-04 ENCOUNTER — Other Ambulatory Visit: Payer: Self-pay

## 2020-02-04 ENCOUNTER — Encounter: Payer: Self-pay | Admitting: Endocrinology

## 2020-02-04 VITALS — BP 124/66 | HR 70 | Ht 72.0 in | Wt 298.2 lb

## 2020-02-04 DIAGNOSIS — E782 Mixed hyperlipidemia: Secondary | ICD-10-CM

## 2020-02-04 DIAGNOSIS — E1165 Type 2 diabetes mellitus with hyperglycemia: Secondary | ICD-10-CM

## 2020-02-04 DIAGNOSIS — I10 Essential (primary) hypertension: Secondary | ICD-10-CM | POA: Diagnosis not present

## 2020-02-04 NOTE — Progress Notes (Signed)
Patient ID: Mark White, male   DOB: 04-15-1956, 64 y.o.   MRN: [id number]      Reason for Appointment : Followup for Type 2 Diabetes  History of Present Illness          Diagnosis: Type 2 diabetes mellitus, date of diagnosis:   2013        Past history:  At diagnosis he was having borderline hyperglycemia. He had previously been taking metformin from his PCP. He was prescribed this twice a day but would tend to forget and take it once a day.  His A1c in early 2014 was 7.2   Because of his relatively high A1c of 7.5 and obesity he was started on Trulicity 6.60 mg weekly in 06/2013 Previously in 09/2014 he had made substantial changes in his diet and lost over 20 pounds His A1c with this was also much better than usual at 5.8  Recent history:   Non-insulin hypoglycemic drugs: Metformin ER 750 mg, 2 tablets daily at dinnertime, Amaryl 2 mg daily, Rybelsus 7 mg daily  His A1c was 6.5 done by his PCP in June compared to 7.8  Fructosamine last 216  Current blood sugar patterns and problems identified:  He has been able to continue Rybelsus without any nausea or fatigue  Although unclear whether he has lost any weight recently is able to usually do better with his diet  He thinks it does not cause as much satiety has in the beginning  He is also trying to exercise on his exercise bike  He is however checking his blood sugars very sporadically again with readings as below, he thinks he is checking blood sugars mostly when he thinks he has gone off his diet are eating some sweets Lab glucose was not fasting  Side effects from medications have been: Fatigue and nausea from Trulicity  Glucose monitoring:  done every 2 to 3 days      Glucometer:    Accu-Chek Blood Glucose readings:  After breakfast 166, 153 and bedtime 114 and 185  Self-care: The diet that the patient has been following is: No specific diet Diet history:  Typical breakfast is an egg,  with bread    Physical activity: exercise: See above      Dietician visit: Most recent: 5/14, does not want to pay for this again.      Weight history: Usually ranging from 290-305  Wt Readings from Last 3 Encounters:  02/04/20 298 lb 3.2 oz (135.3 kg)  11/19/19 297 lb (134.7 kg)  11/05/19 297 lb (134.7 kg)    Diabetes control:    Lab Results  Component Value Date   HGBA1C 6.5 01/07/2020   HGBA1C 7.8 (H) 05/13/2019   HGBA1C 7.0 (H) 02/05/2019   Lab Results  Component Value Date   MICROALBUR 0.8 02/05/2019   LDLCALC 80 01/07/2020   CREATININE 1.0 01/07/2020    No visits with results within 1 Week(s) from this visit.  Latest known visit with results is:  Abstract on 01/13/2020  Component Date Value Ref Range Status   Glucose 01/07/2020 148   Final   BUN 01/07/2020 15  4 - 21 Final   Creatinine 01/07/2020 1.0  0.6 - 1.3 Final   Potassium 01/07/2020 4.8  3.4 - 5.3 Final   Sodium 01/07/2020 143  137 - 147 Final   Chloride 01/07/2020 105  99 - 108 Final   Calcium 01/07/2020 9.4  8.7 - 10.7 Final   Triglycerides 01/07/2020 77  40 - 160 Final   Cholesterol 01/07/2020 125  0 - 200 Final   HDL 01/07/2020 29* 35 - 70 Final   LDL Cholesterol 01/07/2020 80   Final   Hemoglobin A1C 01/07/2020 6.5   Final    Allergies as of 02/04/2020   No Known Allergies     Medication List       Accurate as of February 04, 2020 11:04 AM. If you have any questions, ask your nurse or doctor.        Accu-Chek Guide test strip Generic drug: glucose blood USE TO CHECK BLOOD SUGAR ONCE DAILY.**E11.65**   Accu-Chek Guide w/Device Kit 1 each by Does not apply route daily. Use glucose meter to check blood sugar as prescribed.   aspirin 81 MG tablet Take 1 tablet (81 mg total) by mouth daily. STOP TAKING AND RESTART Dec 29 2011 What changed: additional instructions   atorvastatin 40 MG tablet Commonly known as: LIPITOR Take 40 mg by mouth daily.   glimepiride 2 MG tablet Commonly known  as: AMARYL Take 0.5 tablets (1 mg total) by mouth daily after supper.   losartan 100 MG tablet Commonly known as: COZAAR Take by mouth.   metFORMIN 750 MG 24 hr tablet Commonly known as: GLUCOPHAGE-XR Take 2 tablets (1,500 mg total) by mouth daily with supper.   OneTouch Delica Plus [id number] Misc USE TO CHECK BLOOD SUGAR ONCE A DAY DX CODE E11.65   Rybelsus 7 MG Tabs Generic drug: Semaglutide Take 7 mg by mouth daily.       Allergies: No Known Allergies  Past Medical History:  Diagnosis Date   Allergy    Arthritis    knees   Diabetes mellitus without complication ([place])    Hyperlipidemia    Hypertension    Personal history of adenomatous colonic polyps 01/19/2011   Sleep apnea    not using CPAP   Ulcer    30 years ago    Past Surgical History:  Procedure Laterality Date   COLONOSCOPY  12/14/2011   Procedure: COLONOSCOPY;  Surgeon: Gatha Mayer, MD;  Location: [facility];  Service: Endoscopy;  Laterality: N/A;  colon wiht APC for follow up of colon polyps   COLONOSCOPY N/A 10/10/2012   Procedure: COLONOSCOPY;  Surgeon: Gatha Mayer, MD;  Location: [facility];  Service: Endoscopy;  Laterality: N/A;  Need APC   COLONOSCOPY     COLONOSCOPY W/ POLYPECTOMY  01/13/11   3 polyps, largest 3 cm cecal polyp - adenomas   HOT HEMOSTASIS  12/14/2011   Procedure: HOT HEMOSTASIS (ARGON PLASMA COAGULATION/BICAP);  Surgeon: Gatha Mayer, MD;  Location: [facility];  Service: Endoscopy;  Laterality: N/A;   VASECTOMY     with sedation     Family History  Problem Relation Age of Onset   Colon cancer Mother 46   Heart disease Mother    Heart disease Father    Cancer - Other Daughter        glioblastoma- died age 84    Obesity Neg Hx    Sleep apnea Neg Hx    Stroke Neg Hx    Heart attack Neg Hx    Colon polyps Neg Hx    Rectal cancer Neg Hx    Stomach cancer Neg Hx    Esophageal cancer Neg Hx     Social History:  reports that he has never  smoked. He has never used smokeless tobacco. He reports current alcohol use. He reports that he does not  use drugs.    Review of Systems       Lipids: Has been on Lipitor for over 10 years for hypercholesterolemia,  followed by PCP. Triglycerides had been controlled with fenofibrate  LDL is controlled but his HDL has been consistently low    Lab Results  Component Value Date   CHOL 125 01/07/2020   HDL 29 (A) 01/07/2020   LDLCALC 80 01/07/2020   TRIG 77 01/07/2020   CHOLHDL 5 02/05/2019           Has history of sleep apnea  Is complaining of some fatigue and somnolence in the afternoons but has not discussed with PCP, last TSH normal  HYPERTENSION: He is on losartan 100 mg    BP Readings from Last 3 Encounters:  02/04/20 124/66  11/19/19 129/68  08/28/18 128/74     Last foot exam done with PCP  TSH 2.01 in 6/21  Physical Examination:  BP 124/66 (BP Location: Left Arm, Patient Position: Sitting, Cuff Size: Large)    Pulse 70    Ht 6' (1.829 m)    Wt 298 lb 3.2 oz (135.3 kg)    SpO2 95%    BMI 40.44 kg/m    ASSESSMENT:  Diabetes type 2, With obesity    See history of present illness for discussion of current diabetes management, blood sugar patterns and problems identified  His A1c is much better at 6.5  He has benefited from Rybelsus which he is able to take regularly in the morning as directed Currently on 7 mg along with his Amaryl and Metformin Also has been trying to exercise with his stationary bike  However blood sugar monitoring has been minimal, highest blood sugar 185 at home   HYPERTENSION: His blood pressure is well controlled and he will stay on losartan  LIPIDS: Controlled including triglycerides with Lipitor only now As before has low HDL    PLAN:  For diabetes: Try 14 mg Rybelsus Needs to check blood sugars more regularly on a routine basis rather than sporadically as above Discussed blood sugar targets at various times He will need  to try and work more on controlling calorie intake and increasing exercise May be able to reduce Amaryl if his blood sugars low normal, currently lowest blood sugar seen is 114   Recheck A1c in 3 months  There are no Patient Instructions on file for this visit.   Elayne Snare 02/04/2020, 11:04 AM

## 2020-02-04 NOTE — Patient Instructions (Signed)
Check blood sugars on waking up 2 days a week  Also check blood sugars about 2 hours after meals and do this after different meals by rotation  Recommended blood sugar levels on waking up are 90-130 and about 2 hours after meal is 130-160  Please bring your blood sugar monitor to each visit, thank you  Rybelsus 14mg  daily

## 2020-03-30 ENCOUNTER — Other Ambulatory Visit: Payer: Self-pay | Admitting: Endocrinology

## 2020-04-02 ENCOUNTER — Other Ambulatory Visit: Payer: Self-pay | Admitting: Endocrinology

## 2020-05-07 ENCOUNTER — Other Ambulatory Visit: Payer: Self-pay

## 2020-05-07 ENCOUNTER — Other Ambulatory Visit (INDEPENDENT_AMBULATORY_CARE_PROVIDER_SITE_OTHER): Payer: BC Managed Care – PPO

## 2020-05-07 DIAGNOSIS — E1165 Type 2 diabetes mellitus with hyperglycemia: Secondary | ICD-10-CM

## 2020-05-07 LAB — COMPREHENSIVE METABOLIC PANEL
ALT: 29 U/L (ref 0–53)
AST: 18 U/L (ref 0–37)
Albumin: 4.2 g/dL (ref 3.5–5.2)
Alkaline Phosphatase: 71 U/L (ref 39–117)
BUN: 17 mg/dL (ref 6–23)
CO2: 29 mEq/L (ref 19–32)
Calcium: 9.4 mg/dL (ref 8.4–10.5)
Chloride: 106 mEq/L (ref 96–112)
Creatinine, Ser: 1.12 mg/dL (ref 0.40–1.50)
GFR: 68.67 mL/min (ref >60.00)
Glucose, Bld: 136 mg/dL — ABNORMAL HIGH (ref 70–99)
Potassium: 3.9 mEq/L (ref 3.5–5.1)
Sodium: 141 mEq/L (ref 135–145)
Total Bilirubin: 0.7 mg/dL (ref 0.2–1.2)
Total Protein: 6.8 g/dL (ref 6.0–8.3)

## 2020-05-07 LAB — MICROALBUMIN / CREATININE URINE RATIO
Creatinine,U: 192.6 mg/dL
Microalb Creat Ratio: 0.4 mg/g (ref 0.0–30.0)
Microalb, Ur: 0.8 mg/dL (ref 0.0–1.9)

## 2020-05-07 LAB — HEMOGLOBIN A1C: Hgb A1c MFr Bld: 7.6 % — ABNORMAL HIGH (ref 4.6–6.5)

## 2020-05-11 ENCOUNTER — Other Ambulatory Visit: Payer: Self-pay

## 2020-05-11 ENCOUNTER — Encounter: Payer: Self-pay | Admitting: Endocrinology

## 2020-05-11 ENCOUNTER — Ambulatory Visit: Payer: BC Managed Care – PPO | Admitting: Endocrinology

## 2020-05-11 VITALS — BP 138/70 | HR 99 | Wt 309.2 lb

## 2020-05-11 DIAGNOSIS — Z23 Encounter for immunization: Secondary | ICD-10-CM

## 2020-05-11 DIAGNOSIS — E1165 Type 2 diabetes mellitus with hyperglycemia: Secondary | ICD-10-CM | POA: Diagnosis not present

## 2020-05-11 DIAGNOSIS — E782 Mixed hyperlipidemia: Secondary | ICD-10-CM

## 2020-05-11 LAB — POCT GLYCOSYLATED HEMOGLOBIN (HGB A1C): Hemoglobin A1C: 7 % — AB (ref 4.0–5.6)

## 2020-05-11 NOTE — Progress Notes (Signed)
Patient ID: Mark White, male   DOB: 03/26/1956, 64 y.o.   MRN: [id number]      Reason for Appointment : Followup for Type 2 Diabetes  History of Present Illness          Diagnosis: Type 2 diabetes mellitus, date of diagnosis:   2013        Past history:  At diagnosis he was having borderline hyperglycemia. He had previously been taking metformin from his PCP. He was prescribed this twice a day but would tend to forget and take it once a day.  His A1c in early 2014 was 7.2   Because of his relatively high A1c of 7.5 and obesity he was started on Trulicity 5.40 mg weekly in 06/2013 Previously in 09/2014 he had made substantial changes in his diet and lost over 20 pounds His A1c with this was also much better than usual at 5.8  Recent history:   Non-insulin hypoglycemic drugs: Metformin ER 750 mg, 2 tablets daily at dinnertime, Amaryl 2 mg daily, Rybelsus 7 mg, currently not taking  His A1c was 6.5 previously and now 7.6  Fructosamine last 216  Current blood sugar patterns and problems identified:  He was told to try 2 tablets of the 7 mg Rybelsus to see if his overall control and weight loss would be better  However he had a lot of nausea with the higher dose  Instead of going back to the 7 mg or letting us know he stopped the Rybelsus completely about 6 weeks ago; had no nausea with 7 mg previously  His weight has gone up significantly  He does not exercise as he was doing before and his bike  Also likely not watching his diet especially without the Rybelsus  He is checking blood sugars again very sporadically and usually not after dinner  Highest reading was 289 after eating [place] food   Side effects from medications have been: Fatigue and nausea from Trulicity  Glucose monitoring:  done every 2 to 3 days      Glucometer:    Accu-Chek Blood Glucose readings:   PRE-MEAL Fasting Lunch Dinner Bedtime Overall  Glucose range:  125-140  139      Mean/median:      163   POST-MEAL PC Breakfast PC Lunch PC Dinner  Glucose range:  188, 194   289, 179  Mean/median:       Self-care: The diet that the patient has been following is: No specific diet Diet history:  Typical breakfast is an egg,  with bread   Physical activity: exercise: See above      Dietician visit: Most recent: 5/14, does not want to pay for this again.      Weight history: Usually ranging from 290-305  Wt Readings from Last 3 Encounters:  05/11/20 (!) 309 lb 3.2 oz (140.3 kg)  02/04/20 298 lb 3.2 oz (135.3 kg)  11/19/19 297 lb (134.7 kg)    Diabetes control:    Lab Results  Component Value Date   HGBA1C 7.0 (A) 05/11/2020   HGBA1C 7.6 (H) 05/07/2020   HGBA1C 6.5 01/07/2020   Lab Results  Component Value Date   MICROALBUR 0.8 05/07/2020   [place] 80 01/07/2020   CREATININE 1.12 05/07/2020    Office Visit on 05/11/2020  Component Date Value Ref Range Status  . Hemoglobin A1C 05/11/2020 7.0* 4.0 - 5.6 % Final  Lab on 05/07/2020  Component Date Value Ref Range Status  . Microalb,  Ur 05/07/2020 0.8  0.0 - 1.9 mg/dL Final  . Creatinine,U 05/07/2020 192.6  mg/dL Final  . Microalb Creat Ratio 05/07/2020 0.4  0.0 - 30.0 mg/g Final  . Sodium 05/07/2020 141  135 - 145 mEq/L Final  . Potassium 05/07/2020 3.9  3.5 - 5.1 mEq/L Final  . Chloride 05/07/2020 106  96 - 112 mEq/L Final  . CO2 05/07/2020 29  19 - 32 mEq/L Final  . Glucose, Bld 05/07/2020 136* 70 - 99 mg/dL Final  . BUN 05/07/2020 17  6 - 23 mg/dL Final  . Creatinine, Ser 05/07/2020 1.12  0.40 - 1.50 mg/dL Final  . Total Bilirubin 05/07/2020 0.7  0.2 - 1.2 mg/dL Final  . Alkaline Phosphatase 05/07/2020 71  39 - 117 U/L Final  . AST 05/07/2020 18  0 - 37 U/L Final  . ALT 05/07/2020 29  0 - 53 U/L Final  . Total Protein 05/07/2020 6.8  6.0 - 8.3 g/dL Final  . Albumin 05/07/2020 4.2  3.5 - 5.2 g/dL Final  . GFR 05/07/2020 68.67  >60.00 mL/min Final  . Calcium 05/07/2020 9.4  8.4 - 10.5 mg/dL  Final  . Hgb A1c MFr Bld 05/07/2020 7.6* 4.6 - 6.5 % Final   Glycemic Control Guidelines for People with Diabetes:Non Diabetic:  <6%Goal of Therapy: <7%Additional Action Suggested:  >8%     Allergies as of 05/11/2020   No Known Allergies     Medication List       Accurate as of May 11, 2020 10:46 AM. If you have any questions, ask your nurse or doctor.        Accu-Chek Guide test strip Generic drug: glucose blood USE TO CHECK BLOOD SUGAR ONCE DAILY.**E11.65**   Accu-Chek Guide w/Device Kit 1 each by Does not apply route daily. Use glucose meter to check blood sugar as prescribed.   aspirin 81 MG tablet Take 1 tablet (81 mg total) by mouth daily. STOP TAKING AND RESTART Dec 29 2011 What changed: additional instructions   atorvastatin 40 MG tablet Commonly known as: LIPITOR Take 40 mg by mouth daily.   glimepiride 2 MG tablet Commonly known as: AMARYL TAKE 1/2 TABLET (1 MG TOTAL) BY MOUTH DAILY AFTER SUPPER.   losartan 100 MG tablet Commonly known as: COZAAR Take by mouth.   metFORMIN 750 MG 24 hr tablet Commonly known as: GLUCOPHAGE-XR TAKE 2 TABLETS (1,500 MG TOTAL) BY MOUTH DAILY WITH SUPPER.   OneTouch Delica Plus [id number] Misc USE TO CHECK BLOOD SUGAR ONCE A DAY DX CODE E11.65   Rybelsus 7 MG Tabs Generic drug: Semaglutide Take 7 mg by mouth daily.       Allergies: No Known Allergies  Past Medical History:  Diagnosis Date  . Allergy   . Arthritis    knees  . Diabetes mellitus without complication ([place])   . Hyperlipidemia   . Hypertension   . Personal history of adenomatous colonic polyps 01/19/2011  . Sleep apnea    not using CPAP  . Ulcer    30 years ago    Past Surgical History:  Procedure Laterality Date  . COLONOSCOPY  12/14/2011   Procedure: COLONOSCOPY;  Surgeon: Gatha Mayer, MD;  Location: [facility];  Service: Endoscopy;  Laterality: N/A;  colon wiht APC for follow up of colon polyps  . COLONOSCOPY N/A 10/10/2012    Procedure: COLONOSCOPY;  Surgeon: Gatha Mayer, MD;  Location: [facility];  Service: Endoscopy;  Laterality: N/A;  Need APC  . COLONOSCOPY    .  COLONOSCOPY W/ POLYPECTOMY  01/13/11   3 polyps, largest 3 cm cecal polyp - adenomas  . HOT HEMOSTASIS  12/14/2011   Procedure: HOT HEMOSTASIS (ARGON PLASMA COAGULATION/BICAP);  Surgeon: Gatha Mayer, MD;  Location: [facility];  Service: Endoscopy;  Laterality: N/A;  . VASECTOMY     with sedation     Family History  Problem Relation Age of Onset  . Colon cancer Mother 46  . Heart disease Mother   . Heart disease Father   . Cancer - Other Daughter        glioblastoma- died age 20   . Obesity Neg Hx   . Sleep apnea Neg Hx   . Stroke Neg Hx   . Heart attack Neg Hx   . Colon polyps Neg Hx   . Rectal cancer Neg Hx   . Stomach cancer Neg Hx   . Esophageal cancer Neg Hx     Social History:  reports that he has never smoked. He has never used smokeless tobacco. He reports current alcohol use. He reports that he does not use drugs.    Review of Systems       Lipids: Has been on Lipitor for over 10 years for hypercholesterolemia,  followed by PCP. Triglycerides had been controlled with fenofibrate  LDL is controlled but his HDL has been consistently low    Lab Results  Component Value Date   CHOL 125 01/07/2020   HDL 29 (A) 01/07/2020   LDLCALC 80 01/07/2020   TRIG 77 01/07/2020   CHOLHDL 5 02/05/2019           Has history of sleep apnea  Is complaining of some fatigue and somnolence in the afternoons but has not discussed with PCP, last TSH normal  HYPERTENSION: He is on losartan 100 mg    BP Readings from Last 3 Encounters:  05/11/20 138/70  02/04/20 124/66  11/19/19 129/68     Last foot exam done with PCP  TSH 2.01 in 6/21  Physical Examination:  BP 138/70   Pulse 99   Wt (!) 309 lb 3.2 oz (140.3 kg)   BMI 41.94 kg/m    ASSESSMENT:  Diabetes type 2, With obesity    See history of present illness  for discussion of current diabetes management, blood sugar patterns and problems identified  His A1c is 7.6 compared to 6.5  Previously had done well with 7 mg Rybelsus which he was able to take regularly without nausea or fatigue Even though he could have gone back to the 70 mg dose when he had intolerance to 40 mg he did not do so and with no Rybelsus he has gained weight as well as blood sugars are mostly higher  Currently on only Amaryl and Metformin Also has not been trying to exercise with his stationary bike as before  As before does not check readings for blood sugars much especially after meals   HYPERTENSION: His blood pressure is well controlled with losartan Microalbumin normal  LIPIDS: To be rechecked on the next visit    PLAN:  For diabetes: Try going back to 7 mg Rybelsus Initially may try half a tablet More consistent glucose monitoring He will try to get his exercise bike at least every other day since he needs more aerobic activity Get back into watching his portions and snacks  Call if having any difficulties   Recheck A1c in 3 months  There are no Patient Instructions on file for this visit.  Elayne Snare 05/11/2020, 10:46 AM

## 2020-05-11 NOTE — Patient Instructions (Addendum)
Check blood sugars on waking up 2-3 days a week  Also check blood sugars about 2 hours after meals and do this after different meals by rotation  Recommended blood sugar levels on waking up are 90-130 and about 2 hours after meal is 130-160  Please bring your blood sugar monitor to each visit, thank you  Exercise !

## 2020-06-30 ENCOUNTER — Other Ambulatory Visit: Payer: Self-pay | Admitting: Endocrinology

## 2020-08-07 ENCOUNTER — Ambulatory Visit: Payer: Medicare Other

## 2020-08-07 ENCOUNTER — Other Ambulatory Visit: Payer: Self-pay

## 2020-08-07 DIAGNOSIS — E1165 Type 2 diabetes mellitus with hyperglycemia: Secondary | ICD-10-CM

## 2020-08-07 DIAGNOSIS — E782 Mixed hyperlipidemia: Secondary | ICD-10-CM

## 2020-08-07 LAB — LIPID PANEL
Cholesterol: 114 mg/dL (ref 0–200)
HDL: 25.5 mg/dL — ABNORMAL LOW (ref >39.00)
LDL Cholesterol: 61 mg/dL (ref 0–99)
NonHDL: 88.64
Total CHOL/HDL Ratio: 4
Triglycerides: 136 mg/dL (ref 0.0–149.0)
VLDL: 27.2 mg/dL (ref 0.0–40.0)

## 2020-08-07 LAB — COMPREHENSIVE METABOLIC PANEL
ALT: 28 U/L (ref 0–53)
AST: 17 U/L (ref 0–37)
Albumin: 4.3 g/dL (ref 3.5–5.2)
Alkaline Phosphatase: 88 U/L (ref 39–117)
BUN: 14 mg/dL (ref 6–23)
CO2: 29 mEq/L (ref 19–32)
Calcium: 9.7 mg/dL (ref 8.4–10.5)
Chloride: 105 mEq/L (ref 96–112)
Creatinine, Ser: 1.1 mg/dL (ref 0.40–1.50)
GFR: 70.68 mL/min (ref >60.00)
Glucose, Bld: 174 mg/dL — ABNORMAL HIGH (ref 70–99)
Potassium: 4.6 mEq/L (ref 3.5–5.1)
Sodium: 139 mEq/L (ref 135–145)
Total Bilirubin: 0.7 mg/dL (ref 0.2–1.2)
Total Protein: 6.9 g/dL (ref 6.0–8.3)

## 2020-08-07 LAB — HEMOGLOBIN A1C: Hgb A1c MFr Bld: 7.4 % — ABNORMAL HIGH (ref 4.6–6.5)

## 2020-08-11 ENCOUNTER — Other Ambulatory Visit: Payer: Self-pay

## 2020-08-11 ENCOUNTER — Ambulatory Visit (INDEPENDENT_AMBULATORY_CARE_PROVIDER_SITE_OTHER): Payer: Medicare Other | Admitting: Endocrinology

## 2020-08-11 ENCOUNTER — Encounter: Payer: Self-pay | Admitting: Endocrinology

## 2020-08-11 VITALS — BP 130/78 | HR 71 | Ht 72.0 in | Wt 301.2 lb

## 2020-08-11 DIAGNOSIS — I10 Essential (primary) hypertension: Secondary | ICD-10-CM

## 2020-08-11 DIAGNOSIS — E1165 Type 2 diabetes mellitus with hyperglycemia: Secondary | ICD-10-CM | POA: Diagnosis not present

## 2020-08-11 MED ORDER — DAPAGLIFLOZIN PROPANEDIOL 10 MG PO TABS: 10.0000 mg | ORAL_TABLET | Freq: Every day | ORAL | 2 refills | Status: DC

## 2020-08-11 NOTE — Progress Notes (Signed)
Patient ID: Mark White, male   DOB: 1956/03/27, 65 y.o.   MRN: [id number]      Reason for Appointment : Followup for Type 2 Diabetes  History of Present Illness          Diagnosis: Type 2 diabetes mellitus, date of diagnosis:   2013        Past history:  At diagnosis he was having borderline hyperglycemia. He had previously been taking metformin from his PCP. He was prescribed this twice a day but would tend to forget and take it once a day.  His A1c in early 2014 was 7.2   Because of his relatively high A1c of 7.5 and obesity he was started on Trulicity 3.53 mg weekly in 06/2013 Previously in 09/2014 he had made substantial changes in his diet and lost over 20 pounds His A1c with this was also much better than usual at 5.8  Recent history:   Non-insulin hypoglycemic drugs: Metformin ER 750 mg, 2 tablets daily at dinnertime, Amaryl 1 mg daily, Rybelsus 7 mg, currently not taking  His A1c is now 7.4, was 7% in December with PCP   Current blood sugar patterns and problems identified:  He was told to retry Rybelsus on his last visit because of poor control  However he started having nausea again and because of the high cost on Medicare he stopped taking this last month without letting us know  His weight is however lower than what it was last October  He is currently not exercising  As before he checks his blood sugars only for a few days before his appointment  Fasting glucose in the lab was 174  He has difficulty controlling his portions and recently going off his diet with the holidays   Side effects from medications have been: Fatigue and nausea from Trulicity  Glucose monitoring:  done every 2 to 3 days      Glucometer:    Accu-Chek  Blood Glucose readings:  MORNING blood sugars range from 1 11-218 Midday 141-177  Previous data:  PRE-MEAL Fasting Lunch Dinner Bedtime Overall  Glucose range:  125-140  139     Mean/median:      163   POST-MEAL PC  Breakfast PC Lunch PC Dinner  Glucose range:  188, 194   289, 179  Mean/median:       Self-care: The diet that the patient has been following is: No specific diet Diet history:  Typical breakfast is an egg,  with bread   Physical activity: exercise: See above      Dietician visit: Most recent: 5/14, does not want to pay for this again.      Weight history: Usually ranging from 290-305  Wt Readings from Last 3 Encounters:  08/11/20 (!) 301 lb 3.2 oz (136.6 kg)  05/11/20 (!) 309 lb 3.2 oz (140.3 kg)  02/04/20 298 lb 3.2 oz (135.3 kg)    Diabetes control:    Lab Results  Component Value Date   HGBA1C 7.4 (H) 08/07/2020   HGBA1C 7.0 (A) 05/11/2020   HGBA1C 7.6 (H) 05/07/2020   Lab Results  Component Value Date   MICROALBUR 0.8 05/07/2020   [place] 61 08/07/2020   CREATININE 1.10 08/07/2020    Office Visit on 08/07/2020  Component Date Value Ref Range Status  . Cholesterol 08/07/2020 114  0 - 200 mg/dL Final   ATP III Classification       Desirable:  < 200 mg/dL  Borderline High:  200 - 239 mg/dL          High:  > = 240 mg/dL  . Triglycerides 08/07/2020 136.0  0.0 - 149.0 mg/dL Final   Normal:  <150 mg/dLBorderline High:  150 - 199 mg/dL  . HDL 08/07/2020 25.50* >39.00 mg/dL Final  . VLDL 08/07/2020 27.2  0.0 - 40.0 mg/dL Final  . LDL Cholesterol 08/07/2020 61  0 - 99 mg/dL Final  . Total CHOL/HDL Ratio 08/07/2020 4   Final                  Men          Women1/2 Average Risk     3.4          3.3Average Risk          5.0          4.42X Average Risk          9.6          7.13X Average Risk          15.0          11.0                      . NonHDL 08/07/2020 88.64   Final   NOTE:  Non-HDL goal should be 30 mg/dL higher than patient's LDL goal (i.e. LDL goal of < 70 mg/dL, would have non-HDL goal of < 100 mg/dL)  . Sodium 08/07/2020 139  135 - 145 mEq/L Final  . Potassium 08/07/2020 4.6  3.5 - 5.1 mEq/L Final  . Chloride 08/07/2020 105  96 - 112 mEq/L Final  .  CO2 08/07/2020 29  19 - 32 mEq/L Final  . Glucose, Bld 08/07/2020 174* 70 - 99 mg/dL Final  . BUN 08/07/2020 14  6 - 23 mg/dL Final  . Creatinine, Ser 08/07/2020 1.10  0.40 - 1.50 mg/dL Final  . Total Bilirubin 08/07/2020 0.7  0.2 - 1.2 mg/dL Final  . Alkaline Phosphatase 08/07/2020 88  39 - 117 U/L Final  . AST 08/07/2020 17  0 - 37 U/L Final  . ALT 08/07/2020 28  0 - 53 U/L Final  . Total Protein 08/07/2020 6.9  6.0 - 8.3 g/dL Final  . Albumin 08/07/2020 4.3  3.5 - 5.2 g/dL Final  . GFR 08/07/2020 70.68  >60.00 mL/min Final   Calculated using the CKD-EPI Creatinine Equation (2021)  . Calcium 08/07/2020 9.7  8.4 - 10.5 mg/dL Final  . Hgb A1c MFr Bld 08/07/2020 7.4* 4.6 - 6.5 % Final   Glycemic Control Guidelines for People with Diabetes:Non Diabetic:  <6%Goal of Therapy: <7%Additional Action Suggested:  >8%     Allergies as of 08/11/2020   No Known Allergies     Medication List       Accurate as of August 11, 2020 10:58 AM. If you have any questions, ask your nurse or doctor.        Accu-Chek Guide test strip Generic drug: glucose blood USE TO CHECK BLOOD SUGAR ONCE DAILY.**E11.65**   Accu-Chek Guide w/Device Kit 1 each by Does not apply route daily. Use glucose meter to check blood sugar as prescribed.   aspirin 81 MG tablet Take 1 tablet (81 mg total) by mouth daily. STOP TAKING AND RESTART Dec 29 2011 What changed: additional instructions   atorvastatin 40 MG tablet Commonly known as: LIPITOR Take 40 mg by mouth daily.   glimepiride 2 MG tablet Commonly  known as: AMARYL TAKE 1/2 TABLET (1 MG TOTAL) BY MOUTH DAILY AFTER SUPPER.   losartan 100 MG tablet Commonly known as: COZAAR Take by mouth.   metFORMIN 750 MG 24 hr tablet Commonly known as: GLUCOPHAGE-XR TAKE 2 TABLETS (1,500 MG TOTAL) BY MOUTH DAILY WITH SUPPER.   OneTouch Delica Plus [id number] Misc USE TO CHECK BLOOD SUGAR ONCE A DAY DX CODE E11.65   Rybelsus 7 MG Tabs Generic drug: Semaglutide Take  7 mg by mouth daily.       Allergies: No Known Allergies  Past Medical History:  Diagnosis Date  . Allergy   . Arthritis    knees  . Diabetes mellitus without complication ([place])   . Hyperlipidemia   . Hypertension   . Personal history of adenomatous colonic polyps 01/19/2011  . Sleep apnea    not using CPAP  . Ulcer    30 years ago    Past Surgical History:  Procedure Laterality Date  . COLONOSCOPY  12/14/2011   Procedure: COLONOSCOPY;  Surgeon: Gatha Mayer, MD;  Location: [facility];  Service: Endoscopy;  Laterality: N/A;  colon wiht APC for follow up of colon polyps  . COLONOSCOPY N/A 10/10/2012   Procedure: COLONOSCOPY;  Surgeon: Gatha Mayer, MD;  Location: [facility];  Service: Endoscopy;  Laterality: N/A;  Need APC  . COLONOSCOPY    . COLONOSCOPY W/ POLYPECTOMY  01/13/11   3 polyps, largest 3 cm cecal polyp - adenomas  . HOT HEMOSTASIS  12/14/2011   Procedure: HOT HEMOSTASIS (ARGON PLASMA COAGULATION/BICAP);  Surgeon: Gatha Mayer, MD;  Location: [facility];  Service: Endoscopy;  Laterality: N/A;  . VASECTOMY     with sedation     Family History  Problem Relation Age of Onset  . Colon cancer Mother 33  . Heart disease Mother   . Heart disease Father   . Cancer - Other Daughter        glioblastoma- died age 65   . Obesity Neg Hx   . Sleep apnea Neg Hx   . Stroke Neg Hx   . Heart attack Neg Hx   . Colon polyps Neg Hx   . Rectal cancer Neg Hx   . Stomach cancer Neg Hx   . Esophageal cancer Neg Hx     Social History:  reports that he has never smoked. He has never used smokeless tobacco. He reports current alcohol use. He reports that he does not use drugs.    Review of Systems       Lipids: Has been on Lipitor for over 10 years for hypercholesterolemia,  followed by PCP. Triglycerides had been controlled with fenofibrate  LDL is controlled but his HDL has been consistently low    Lab Results  Component Value Date   CHOL 114 08/07/2020    HDL 25.50 (L) 08/07/2020   LDLCALC 61 08/07/2020   TRIG 136.0 08/07/2020   CHOLHDL 4 08/07/2020           Has history of sleep apnea  Is complaining of some fatigue and somnolence in the afternoons but has not discussed with PCP, last TSH normal  HYPERTENSION: He is on losartan 100 mg    BP Readings from Last 3 Encounters:  08/11/20 130/78  05/11/20 138/70  02/04/20 124/66     Last foot exam done with PCP  TSH 2.01 in 6/21  Physical Examination:  BP 130/78   Pulse 71   Ht 6' (1.829 m)   Annell Greening Marland Kitchen)  301 lb 3.2 oz (136.6 kg)   SpO2 96%   BMI 40.85 kg/m    ASSESSMENT:  Diabetes type 2, With obesity    See history of present illness for discussion of current diabetes management, blood sugar patterns and problems identified  His A1c is 7.4, previously has been as low as 6.2  Previously had done well with 7 mg Rybelsus but last year he apparently started having nausea again and stopped the medication Is also not able to afford this with his Medicare plan His blood sugars have been as high as 170 fasting with not taking Rybelsus  Currently on 1 mg Amaryl and Metformin 1500 mg  Has difficulty losing weight as before Also has only a few blood sugars in the last 10 days, mostly checked before or after breakfast only   HYPERTENSION: His blood pressure is well controlled with losartan 100 mg  LIPIDS: Well-controlled    PLAN:  For diabetes: Currently needs to be on a more effective medication regimen especially to help reduce weight and long-term cardiovascular risk Discussed action of SGLT 2 drugs on lowering glucose by decreasing kidney absorption of glucose, benefits of weight loss and lower blood pressure, possible side effects including candidiasis and dosage regimen Patient information given on Farxiga  Will prescribe Farxiga 10 mg daily and he will try to get this on a 30-day free coupon Initially may try half a tablet Also as a precaution we will reduce  losartan to half a tablet He will start checking his blood pressure at home Increase fluid intake Continue 1 mg Amaryl but discussed that if he starts getting low blood sugars we may be able to stop this  More consistent glucose testing especially after dinner Start walking or other aerobic exercise, 30 minutes, 5 days a week  Follow-up in 6 weeks  There are no Patient Instructions on file for this visit.   Elayne Snare 08/11/2020, 10:58 AM

## 2020-08-11 NOTE — Patient Instructions (Signed)
Take 1/2 Losartan with 1/2 Farxiga  Check blood sugars on waking up days a week  Also check blood sugars about 2 hours after meals and do this after different meals by rotation  Recommended blood sugar levels on waking up are 90-130 and about 2 hours after meal is 130-180  Please bring your blood sugar monitor to each visit, thank you

## 2020-08-20 ENCOUNTER — Telehealth: Payer: Self-pay | Admitting: *Deleted

## 2020-08-20 ENCOUNTER — Other Ambulatory Visit: Payer: Self-pay | Admitting: Endocrinology

## 2020-08-20 NOTE — Telephone Encounter (Signed)
Has he tried using the co-pay card with prescription

## 2020-08-21 NOTE — Telephone Encounter (Signed)
He is now on medicare since retiring in December does the $0 copay card work with that or just [employer]?

## 2020-08-21 NOTE — Telephone Encounter (Signed)
Copay card will not work. He can check cost of Jardiance or Invokana. He can cut 10mg  Farxiga pill in 1/2 to save

## 2020-08-21 NOTE — Telephone Encounter (Signed)
He will need to use the [place] with the $0 co-pay card.  He can download this at home also.   This will help his weight loss and diabetes and is independent of his blood pressure medicine

## 2020-08-21 NOTE — Telephone Encounter (Signed)
I spoke with him and he said the coupon he had would only pay for a one time trial offer. He said his cardiologist put him on amlodipine for his blood pressure and he was going to try that and start walking to see if that would help.  I did find a farxiga coupon for $0 that I told him I would mail to see if he could use that.

## 2020-09-22 ENCOUNTER — Other Ambulatory Visit: Payer: Self-pay

## 2020-09-22 ENCOUNTER — Other Ambulatory Visit (INDEPENDENT_AMBULATORY_CARE_PROVIDER_SITE_OTHER): Payer: Medicare Other

## 2020-09-22 DIAGNOSIS — E1165 Type 2 diabetes mellitus with hyperglycemia: Secondary | ICD-10-CM | POA: Diagnosis not present

## 2020-09-22 LAB — BASIC METABOLIC PANEL
BUN: 13 mg/dL (ref 6–23)
CO2: 28 mEq/L (ref 19–32)
Calcium: 9.5 mg/dL (ref 8.4–10.5)
Chloride: 103 mEq/L (ref 96–112)
Creatinine, Ser: 0.9 mg/dL (ref 0.40–1.50)
GFR: 89.85 mL/min (ref >60.00)
Glucose, Bld: 197 mg/dL — ABNORMAL HIGH (ref 70–99)
Potassium: 4.1 mEq/L (ref 3.5–5.1)
Sodium: 138 mEq/L (ref 135–145)

## 2020-09-23 LAB — FRUCTOSAMINE: Fructosamine: 271 umol/L (ref 0–285)

## 2020-09-24 ENCOUNTER — Ambulatory Visit (INDEPENDENT_AMBULATORY_CARE_PROVIDER_SITE_OTHER): Payer: Medicare Other | Admitting: Endocrinology

## 2020-09-24 ENCOUNTER — Other Ambulatory Visit: Payer: Self-pay

## 2020-09-24 ENCOUNTER — Encounter: Payer: Self-pay | Admitting: Endocrinology

## 2020-09-24 VITALS — BP 148/82 | HR 62 | Ht 72.0 in | Wt 302.0 lb

## 2020-09-24 DIAGNOSIS — E1165 Type 2 diabetes mellitus with hyperglycemia: Secondary | ICD-10-CM | POA: Diagnosis not present

## 2020-09-24 MED ORDER — BYDUREON BCISE 2 MG/0.85ML ~~LOC~~ AUIJ: AUTO-INJECTOR | SUBCUTANEOUS | 2 refills | Status: DC

## 2020-09-24 NOTE — Progress Notes (Signed)
Patient ID: Mark White, male   DOB: Dec 13, 1955, 65 y.o.   MRN: [id number]      Reason for Appointment : Followup for Type 2 Diabetes  History of Present Illness          Diagnosis: Type 2 diabetes mellitus, date of diagnosis:   2013        Past history:  At diagnosis he was having borderline hyperglycemia. He had previously been taking metformin from his PCP. He was prescribed this twice a day but would tend to forget and take it once a day.  His A1c in early 2014 was 7.2   Because of his relatively high A1c of 7.5 and obesity he was started on Trulicity 0.86 mg weekly in 06/2013 Previously in 09/2014 he had made substantial changes in his diet and lost over 20 pounds His A1c with this was also much better than usual at 5.8  Recent history:   Non-insulin hypoglycemic drugs: Metformin ER 750 mg, 2 tablets daily at dinnertime, Amaryl 1 mg daily  His A1c is last 7.4, was 7% in December with PCP Fructosamine is 271  Current blood sugar patterns and problems identified:  He was told to start [place] on his last visit because of poor control  However he has no coverage for this medication even though this is the only SGLT2 drug listed as covered on his insurance  He thinks he has changed his lifestyle significantly  He has cut back on portions and started walking up to 30 minutes daily  However his weight is about the same still  He was checking his blood sugar more regularly but stopped doing this a couple of weeks ago  Appears to have persistently high readings throughout the day except before dinnertime when they are generally lower  Fructosamine is 271  Blood sugar after eating toast in the morning was 197  He is recently not eating any protein with his bread in the morning  Side effects from medications have been: Fatigue and nausea from Trulicity  Glucose monitoring:  done every 2 to 3 days      Glucometer:    Accu-Chek  Blood Glucose  readings:  Blood sugars AVERAGE 197 with following averages MORNING 205, afternoon 200, evening 151 and bedtime reading only once at 281  Previous data: MORNING blood sugars range from 1 11-218 Midday 141-177   Self-care: The diet that the patient has been following is: No specific diet Diet history:  Typical breakfast is an egg,  with bread   Physical activity: exercise: See above      Dietician visit: Most recent: 5/14, does not want to pay for this again.      Weight history: Usually ranging from 290-305  Wt Readings from Last 3 Encounters:  09/24/20 (!) 302 lb (137 kg)  08/11/20 (!) 301 lb 3.2 oz (136.6 kg)  05/11/20 (!) 309 lb 3.2 oz (140.3 kg)    Diabetes control:    Lab Results  Component Value Date   HGBA1C 7.4 (H) 08/07/2020   HGBA1C 7.0 (A) 05/11/2020   HGBA1C 7.6 (H) 05/07/2020   Lab Results  Component Value Date   MICROALBUR 0.8 05/07/2020   LDLCALC 61 08/07/2020   CREATININE 0.90 09/22/2020    Lab on 09/22/2020  Component Date Value Ref Range Status  . Fructosamine 09/22/2020 271  0 - 285 umol/L Final   Comment: Published reference interval for apparently healthy subjects between age 12 and 62 is 47 -  285 umol/L and in a poorly controlled diabetic population is 228 - 563 umol/L with a mean of 396 umol/L.   Marland Kitchen Sodium 09/22/2020 138  135 - 145 mEq/L Final  . Potassium 09/22/2020 4.1  3.5 - 5.1 mEq/L Final  . Chloride 09/22/2020 103  96 - 112 mEq/L Final  . CO2 09/22/2020 28  19 - 32 mEq/L Final  . Glucose, Bld 09/22/2020 197* 70 - 99 mg/dL Final  . BUN 09/22/2020 13  6 - 23 mg/dL Final  . Creatinine, Ser 09/22/2020 0.90  0.40 - 1.50 mg/dL Final  . GFR 09/22/2020 89.85  >60.00 mL/min Final   Calculated using the CKD-EPI Creatinine Equation (2021)  . Calcium 09/22/2020 9.5  8.4 - 10.5 mg/dL Final    Allergies as of 09/24/2020   No Known Allergies     Medication List       Accurate as of September 24, 2020 10:57 AM. If you have any questions,  ask your nurse or doctor.        STOP taking these medications   dapagliflozin propanediol 10 MG Tabs tablet Commonly known as: [place] Stopped by: Elayne Snare, MD     TAKE these medications   Accu-Chek Guide test strip Generic drug: glucose blood USE TO CHECK BLOOD SUGAR ONCE DAILY.**E11.65**   Accu-Chek Guide w/Device Kit 1 each by Does not apply route daily. Use glucose meter to check blood sugar as prescribed.   aspirin 81 MG tablet Take 1 tablet (81 mg total) by mouth daily. STOP TAKING AND RESTART Dec 29 2011 What changed: additional instructions   atorvastatin 40 MG tablet Commonly known as: LIPITOR Take 40 mg by mouth daily.   glimepiride 2 MG tablet Commonly known as: AMARYL TAKE 1/2 TABLET (1 MG TOTAL) BY MOUTH DAILY AFTER SUPPER.   losartan 100 MG tablet Commonly known as: COZAAR Take by mouth.   metFORMIN 750 MG 24 hr tablet Commonly known as: GLUCOPHAGE-XR TAKE 2 TABLETS (1,500 MG TOTAL) BY MOUTH DAILY WITH SUPPER.   OneTouch Delica Plus [id number] Misc USE TO CHECK BLOOD SUGAR ONCE A DAY DX CODE E11.65       Allergies: No Known Allergies  Past Medical History:  Diagnosis Date  . Allergy   . Arthritis    knees  . Diabetes mellitus without complication ([place])   . Hyperlipidemia   . Hypertension   . Personal history of adenomatous colonic polyps 01/19/2011  . Sleep apnea    not using CPAP  . Ulcer    30 years ago    Past Surgical History:  Procedure Laterality Date  . COLONOSCOPY  12/14/2011   Procedure: COLONOSCOPY;  Surgeon: Gatha Mayer, MD;  Location: [facility];  Service: Endoscopy;  Laterality: N/A;  colon wiht APC for follow up of colon polyps  . COLONOSCOPY N/A 10/10/2012   Procedure: COLONOSCOPY;  Surgeon: Gatha Mayer, MD;  Location: [facility];  Service: Endoscopy;  Laterality: N/A;  Need APC  . COLONOSCOPY    . COLONOSCOPY W/ POLYPECTOMY  01/13/11   3 polyps, largest 3 cm cecal polyp - adenomas  . HOT HEMOSTASIS  12/14/2011    Procedure: HOT HEMOSTASIS (ARGON PLASMA COAGULATION/BICAP);  Surgeon: Gatha Mayer, MD;  Location: [facility];  Service: Endoscopy;  Laterality: N/A;  . VASECTOMY     with sedation     Family History  Problem Relation Age of Onset  . Colon cancer Mother 9  . Heart disease Mother   . Heart disease Father   .  Cancer - Other Daughter        glioblastoma- died age 76   . Obesity Neg Hx   . Sleep apnea Neg Hx   . Stroke Neg Hx   . Heart attack Neg Hx   . Colon polyps Neg Hx   . Rectal cancer Neg Hx   . Stomach cancer Neg Hx   . Esophageal cancer Neg Hx     Social History:  reports that he has never smoked. He has never used smokeless tobacco. He reports current alcohol use. He reports that he does not use drugs.    Review of Systems       Lipids: Has been on Lipitor for over 10 years for hypercholesterolemia,  followed by PCP. Triglycerides had been controlled with fenofibrate  LDL is controlled but his HDL has been consistently low    Lab Results  Component Value Date   CHOL 114 08/07/2020   HDL 25.50 (L) 08/07/2020   LDLCALC 61 08/07/2020   TRIG 136.0 08/07/2020   CHOLHDL 4 08/07/2020           Has history of sleep apnea   HYPERTENSION: He is on losartan 100 mg and also amlodipine now He has started checking his blood pressure at home  BP Readings from Last 3 Encounters:  09/24/20 (!) 148/82  08/11/20 130/78  05/11/20 138/70     Last foot exam done with PCP  TSH 2.01 in 6/21  Physical Examination:  BP (!) 148/82 (BP Location: Left Arm, Patient Position: Sitting, Cuff Size: Large)   Pulse 62   Ht 6' (1.829 m)   Wt (!) 302 lb (137 kg)   SpO2 93%   BMI 40.96 kg/m    ASSESSMENT:  Diabetes type 2, With obesity    See history of present illness for discussion of current diabetes management, blood sugar patterns and problems identified  His A1c is 7.4, previously has been as low as 6.2  Currently only on Amaryl and Metformin Cannot afford  [place] and not clear why his co-pay is over $500 Blood sugars are significantly higher and as high as 281 recently indicating progression of the diabetes This is despite his trying to change his lifestyle significantly over the last month Also lab glucose was 197 nonfasting but fructosamine seems relatively better at 271  HYPERTENSION: Followed by PCP  LIPIDS: Well-controlled    PLAN:  For diabetes: He will start checking blood sugars again and make sure he checks fasting and postprandial alternating  Since he may do well with a GLP-1 drug with less nausea he will be given a trial of Bydureon Discussed how this works and he will try to see if this is covered by his insurance Likely will need 4 to 6 weeks to see maximum benefit Continue same doses of Amaryl and Metformin   There are no Patient Instructions on file for this visit.   Elayne Snare 09/24/2020, 10:57 AM

## 2020-10-05 ENCOUNTER — Telehealth: Payer: Self-pay | Admitting: Endocrinology

## 2020-10-05 NOTE — Telephone Encounter (Signed)
He needs to find out from his insurance what would be the alternative to [place] or [place]

## 2020-10-05 NOTE — Telephone Encounter (Signed)
Notified pt --sent the Bydureon 09/24/20 and have not received it. Called pharmacy stated--out of stock and pt need to $500. Pt declined due to cannot afford it. Please advise

## 2020-10-05 NOTE — Telephone Encounter (Signed)
Last appt Dr.Kumar mentioned something about starting pt on a new medication but there hasn't been anything sent into his pharmacy yet. Pt was asking if Dr still was going to prescribe?   Callback# [id number]

## 2020-10-09 NOTE — Telephone Encounter (Signed)
Spoke to pt--stated farxiga, trulicity, Bydureon, and ozempic not covered by insurance. Pt questioning what types exercise or meal plan can do to avoid all this expensive medication. Please advise

## 2020-10-09 NOTE — Telephone Encounter (Signed)
He can be scheduled to see the dietitian.  However he needs to talk to his insurance to see if they can provide 1 of these medications on a lower tier

## 2020-10-14 NOTE — Telephone Encounter (Signed)
Called pt--stated will call the insurance to make sure dietitian referral is cover and medication tier. Pt will call the office back with this information.

## 2020-11-23 ENCOUNTER — Other Ambulatory Visit: Payer: Self-pay

## 2020-11-23 ENCOUNTER — Other Ambulatory Visit (INDEPENDENT_AMBULATORY_CARE_PROVIDER_SITE_OTHER): Payer: Medicare Other

## 2020-11-23 DIAGNOSIS — E1165 Type 2 diabetes mellitus with hyperglycemia: Secondary | ICD-10-CM

## 2020-11-23 LAB — HEMOGLOBIN A1C: Hgb A1c MFr Bld: 8 % — ABNORMAL HIGH (ref 4.6–6.5)

## 2020-11-24 LAB — BASIC METABOLIC PANEL
BUN: 14 mg/dL (ref 6–23)
CO2: 27 mEq/L (ref 19–32)
Calcium: 9.5 mg/dL (ref 8.4–10.5)
Chloride: 104 mEq/L (ref 96–112)
Creatinine, Ser: 0.99 mg/dL (ref 0.40–1.50)
GFR: 80.04 mL/min (ref >60.00)
Glucose, Bld: 229 mg/dL — ABNORMAL HIGH (ref 70–99)
Potassium: 4.5 mEq/L (ref 3.5–5.1)
Sodium: 139 mEq/L (ref 135–145)

## 2020-11-25 ENCOUNTER — Ambulatory Visit (INDEPENDENT_AMBULATORY_CARE_PROVIDER_SITE_OTHER): Payer: Medicare Other | Admitting: Endocrinology

## 2020-11-25 ENCOUNTER — Other Ambulatory Visit: Payer: Self-pay

## 2020-11-25 ENCOUNTER — Encounter: Payer: Self-pay | Admitting: Endocrinology

## 2020-11-25 VITALS — BP 144/84 | HR 70 | Ht 72.0 in | Wt 303.4 lb

## 2020-11-25 DIAGNOSIS — E1165 Type 2 diabetes mellitus with hyperglycemia: Secondary | ICD-10-CM | POA: Diagnosis not present

## 2020-11-25 MED ORDER — OZEMPIC (1 MG/DOSE) 2 MG/1.5ML ~~LOC~~ SOPN: 1.0000 mg | PEN_INJECTOR | SUBCUTANEOUS | 0 refills | Status: DC

## 2020-11-25 NOTE — Progress Notes (Signed)
Patient ID: Mark [name], male   DOB: 09/17/1955, 65 y.o.   MRN: [id number]      Reason for Appointment : Followup for Type 2 Diabetes  History of Present Illness          Diagnosis: Type 2 diabetes mellitus, date of diagnosis:   2013        Past history:  At diagnosis he was having borderline hyperglycemia. He had previously been taking metformin from his PCP. He was prescribed this twice a day but would tend to forget and take it once a day.  His A1c in early 2014 was 7.2   Because of his relatively high A1c of 7.5 and obesity he was started on Trulicity 2.12 mg [name] in 06/2013 Previously in 09/2014 he had made substantial changes in his diet and lost over 20 pounds His A1c with this was also much better than usual at 5.8  Recent history:   Non-insulin hypoglycemic drugs: Metformin ER 750 mg, 2 tablets daily at dinnertime, Amaryl 1 mg daily  His A1c is 8 Fructosamine previously 271  Current blood sugar patterns and problems identified:  He has been recommended both [place] and Bydureon as well as Trulicity to help with his control but he has not been able to start this because of high out-of-pocket expense  He has stopped checking his blood sugars at least a month ago and not clear what his previous readings were  Currently only taking half of the 2 mg Amaryl in the evening  His lab glucose was 229 nonfasting after eating oatmeal  He still thinks he has difficulty watching his diet especially with having some junk food available at home; also he has been eating more nuts  However he has started doing some walking or other exercise at least 30 minutes almost every day  Weight is the same  Side effects from medications have been: Fatigue and nausea from Trulicity  Glucose monitoring:  done every 2 to 3 days      Glucometer:    Accu-Chek  Blood Glucose readings: None for the last month at least  PREVIOUS AVERAGE 197 with following averages MORNING 205,  afternoon 200, evening 151 and bedtime reading only once at 281    Self-care: The diet that the patient has been following is: No specific diet Diet history:  Typical breakfast is an egg,  with bread   Physical activity: exercise: See above      Dietician visit: Most recent: 5/14, does not want to pay for this again.      Weight history: Usually ranging from 290-305  Wt Readings from Last 3 Encounters:  11/25/20 (!) 303 lb 6.4 oz (137.6 kg)  09/24/20 (!) 302 lb (137 kg)  08/11/20 (!) 301 lb 3.2 oz (136.6 kg)    Diabetes control:    Lab Results  Component Value Date   HGBA1C 8.0 (H) 11/23/2020   HGBA1C 7.4 (H) 08/07/2020   HGBA1C 7.0 (A) 05/11/2020   Lab Results  Component Value Date   MICROALBUR 0.8 05/07/2020   LDLCALC 61 08/07/2020   CREATININE 0.99 11/23/2020    Lab on 11/23/2020  Component Date Value Ref Range Status  . Sodium 11/23/2020 139  135 - 145 mEq/L Final  . Potassium 11/23/2020 4.5  3.5 - 5.1 mEq/L Final  . Chloride 11/23/2020 104  96 - 112 mEq/L Final  . CO2 11/23/2020 27  19 - 32 mEq/L Final  . Glucose, Bld 11/23/2020 229* 70 -  99 mg/dL Final  . BUN 11/23/2020 14  6 - 23 mg/dL Final  . Creatinine, Ser 11/23/2020 0.99  0.40 - 1.50 mg/dL Final  . GFR 11/23/2020 80.04  >60.00 mL/min Final   Calculated using the CKD-EPI Creatinine Equation (2021)  . Calcium 11/23/2020 9.5  8.4 - 10.5 mg/dL Final  . Hgb A1c MFr Bld 11/23/2020 8.0* 4.6 - 6.5 % Final   Glycemic Control Guidelines for People with Diabetes:Non Diabetic:  <6%Goal of Therapy: <7%Additional Action Suggested:  >8%     Allergies as of 11/25/2020   No Known Allergies     Medication List       Accurate as of November 25, 2020 11:01 AM. If you have any questions, ask your nurse or doctor.        Accu-Chek Guide test strip Generic drug: glucose blood USE TO CHECK BLOOD SUGAR ONCE DAILY.**E11.65**   Accu-Chek Guide w/Device Kit 1 each by Does not apply route daily. Use glucose meter to  check blood sugar as prescribed.   amLODipine 2.5 MG tablet Commonly known as: NORVASC Take by mouth.   aspirin 81 MG tablet Take 1 tablet (81 mg total) by mouth daily. STOP TAKING AND RESTART Dec 29 2011 What changed: additional instructions   atorvastatin 40 MG tablet Commonly known as: LIPITOR Take 40 mg by mouth daily.   Bydureon BCise 2 MG/0.85ML Auij Generic drug: Exenatide ER Inject contents [name]   glimepiride 2 MG tablet Commonly known as: AMARYL TAKE 1/2 TABLET (1 MG TOTAL) BY MOUTH DAILY AFTER SUPPER.   losartan 100 MG tablet Commonly known as: COZAAR Take by mouth.   metFORMIN 750 MG 24 hr tablet Commonly known as: GLUCOPHAGE-XR TAKE 2 TABLETS (1,500 MG TOTAL) BY MOUTH DAILY WITH SUPPER.   OneTouch Delica Plus [id number] Misc USE TO CHECK BLOOD SUGAR ONCE A DAY DX CODE E11.65       Allergies: No Known Allergies  Past Medical History:  Diagnosis Date  . Allergy   . Arthritis    knees  . Diabetes mellitus without complication ([place])   . Hyperlipidemia   . Hypertension   . Personal history of adenomatous colonic polyps 01/19/2011  . Sleep apnea    not using CPAP  . Ulcer    30 years ago    Past Surgical History:  Procedure Laterality Date  . COLONOSCOPY  12/14/2011   Procedure: COLONOSCOPY;  Surgeon: Gatha Mayer, MD;  Location: [facility];  Service: Endoscopy;  Laterality: N/A;  colon wiht APC for follow up of colon polyps  . COLONOSCOPY N/A 10/10/2012   Procedure: COLONOSCOPY;  Surgeon: Gatha Mayer, MD;  Location: [facility];  Service: Endoscopy;  Laterality: N/A;  Need APC  . COLONOSCOPY    . COLONOSCOPY W/ POLYPECTOMY  01/13/11   3 polyps, largest 3 cm cecal polyp - adenomas  . HOT HEMOSTASIS  12/14/2011   Procedure: HOT HEMOSTASIS (ARGON PLASMA COAGULATION/BICAP);  Surgeon: Gatha Mayer, MD;  Location: [facility];  Service: Endoscopy;  Laterality: N/A;  . VASECTOMY     with sedation     Family History  Problem Relation Age of  Onset  . Colon cancer Mother 62  . Heart disease Mother   . Heart disease Father   . Cancer - Other Daughter        glioblastoma- died age 91   . Obesity Neg Hx   . Sleep apnea Neg Hx   . Stroke Neg Hx   . Heart attack Neg  Hx   . Colon polyps Neg Hx   . Rectal cancer Neg Hx   . Stomach cancer Neg Hx   . Esophageal cancer Neg Hx     Social History:  reports that he has never smoked. He has never used smokeless tobacco. He reports current alcohol use. He reports that he does not use drugs.    Review of Systems       Lipids: Has been on Lipitor for over 10 years for hypercholesterolemia,  followed by PCP. Triglycerides had been controlled with fenofibrate  LDL is controlled; his HDL has been consistently low    Lab Results  Component Value Date   CHOL 114 08/07/2020   HDL 25.50 (L) 08/07/2020   LDLCALC 61 08/07/2020   TRIG 136.0 08/07/2020   CHOLHDL 4 08/07/2020           Has history of sleep apnea   HYPERTENSION: He is on losartan 100 mg and also amlodipine followed by PCP He has been checking his blood pressure at home and blood pressure usually is in the 176H and 607P systolic  BP Readings from Last 3 Encounters:  11/25/20 (!) 144/84  09/24/20 (!) 148/82  08/11/20 130/78     Last foot exam done with PCP  TSH 2.01 in 6/21  Physical Examination:  BP (!) 144/84   Pulse 70   Ht 6' (1.829 m)   Wt (!) 303 lb 6.4 oz (137.6 kg)   SpO2 98%   BMI 41.15 kg/m    ASSESSMENT:  Diabetes type 2, With obesity    See history of present illness for discussion of current diabetes management, blood sugar patterns and problems identified  His A1c is higher at 8%, previously has been as low as 6.2  Currently only on Amaryl and Metformin Recent lab glucose 229 Cannot afford any brand-name medications such as [place] or Trulicity  However he is agreeable to start Ozempic since he had less nausea compared to Trulicity and will be able to titrate this more  easily Currently not checking his blood sugar Also has not been able to follow his diet well   HYPERTENSION: Followed by PCP He will need to continue follow-up with his PCP and cardiologist for further adjustment of blood pressure medications if needed    PLAN:  For diabetes: He will start checking blood sugars again especially after meals and some in the morning also  Since he may do well with a GLP-1 drug with better control of his diet he will try Ozempic with a sample Explained to him how to dial the doses and the titration schedule He will use 0.25 mg [name] for at least 2 to 3 injections before trying 0.5 mg Also can try the 1 mg or 2 mg pen and can try starting halfway to get the 0.5 dose [name] Amaryl 2 mg at dinnertime No change in metformin Cut back on high fat snacks especially chips and nuts   There are no Patient Instructions on file for this visit.   Elayne Snare 11/25/2020, 11:01 AM

## 2020-11-25 NOTE — Patient Instructions (Addendum)
Glimeperide 2mg  at dinner  Check blood sugars on waking up 2-3 days a week  Also check blood sugars about 2 hours after meals and do this after different meals by rotation  Recommended blood sugar levels on waking up are 90-130 and about 2 hours after meal is 130-180  Please bring your blood sugar monitor to each visit, thank you  Ozempic dial about 0.25mg  for 1st 2-3 shots then 0.5 weekly

## 2021-01-04 ENCOUNTER — Telehealth: Payer: Self-pay | Admitting: Endocrinology

## 2021-01-04 NOTE — Telephone Encounter (Signed)
Pt wants to inform Dr.Kumar that the ozempic is making him feel bad. So patient is not taking it.

## 2021-01-06 NOTE — Telephone Encounter (Signed)
Addendum: If he is already taking 2 mg Amaryl he can go up to 4 mg at dinnertime, please ask him if he needs a new prescription for this

## 2021-01-06 NOTE — Telephone Encounter (Signed)
He can increase his glimepiride to 2 mg every evening until I see him

## 2021-01-07 NOTE — Telephone Encounter (Signed)
Spoken to patient and notified Dr Ronnie Derby comments. Verbalized understanding.

## 2021-01-07 NOTE — Telephone Encounter (Signed)
Patient stated he does not need a new Rx

## 2021-01-11 ENCOUNTER — Telehealth: Payer: Self-pay | Admitting: Endocrinology

## 2021-01-11 NOTE — Telephone Encounter (Signed)
Pt is wanting a call back from the nurse on getting some clarification on medication glimepiride (AMARYL) 2 MG tablet   Pt is needing to know if he needs to take one or two pills a day.

## 2021-01-14 NOTE — Telephone Encounter (Signed)
My chart message sent as well

## 2021-01-14 NOTE — Telephone Encounter (Signed)
Called and left a detailed message advising pt Dr Dwyane Dee did increase is glimipride dosage to 4 mg when he d/c Ozempic.

## 2021-01-21 ENCOUNTER — Other Ambulatory Visit: Payer: Medicare Other

## 2021-01-26 ENCOUNTER — Ambulatory Visit: Payer: Medicare Other | Admitting: Endocrinology

## 2021-02-08 ENCOUNTER — Other Ambulatory Visit: Payer: Self-pay

## 2021-02-08 ENCOUNTER — Other Ambulatory Visit (INDEPENDENT_AMBULATORY_CARE_PROVIDER_SITE_OTHER): Payer: Medicare Other

## 2021-02-08 DIAGNOSIS — E1165 Type 2 diabetes mellitus with hyperglycemia: Secondary | ICD-10-CM

## 2021-02-08 LAB — BASIC METABOLIC PANEL
BUN: 16 mg/dL (ref 6–23)
CO2: 27 mEq/L (ref 19–32)
Calcium: 9.5 mg/dL (ref 8.4–10.5)
Chloride: 104 mEq/L (ref 96–112)
Creatinine, Ser: 1.01 mg/dL (ref 0.40–1.50)
GFR: 78.03 mL/min (ref >60.00)
Glucose, Bld: 193 mg/dL — ABNORMAL HIGH (ref 70–99)
Potassium: 4.1 mEq/L (ref 3.5–5.1)
Sodium: 140 mEq/L (ref 135–145)

## 2021-02-10 ENCOUNTER — Ambulatory Visit (INDEPENDENT_AMBULATORY_CARE_PROVIDER_SITE_OTHER): Payer: Medicare Other | Admitting: Endocrinology

## 2021-02-10 ENCOUNTER — Other Ambulatory Visit: Payer: Self-pay

## 2021-02-10 ENCOUNTER — Encounter: Payer: Self-pay | Admitting: Endocrinology

## 2021-02-10 VITALS — BP 110/78 | HR 64 | Ht 72.0 in | Wt 306.6 lb

## 2021-02-10 DIAGNOSIS — I10 Essential (primary) hypertension: Secondary | ICD-10-CM

## 2021-02-10 DIAGNOSIS — E1165 Type 2 diabetes mellitus with hyperglycemia: Secondary | ICD-10-CM

## 2021-02-10 MED ORDER — DAPAGLIFLOZIN PROPANEDIOL 10 MG PO TABS: 10.0000 mg | ORAL_TABLET | Freq: Every day | ORAL | 3 refills | Status: DC

## 2021-02-10 MED ORDER — GLIMEPIRIDE 2 MG PO TABS
ORAL_TABLET | ORAL | 3 refills | Status: DC
Start: 2021-02-10 — End: 2021-05-13

## 2021-02-10 NOTE — Progress Notes (Signed)
Patient ID: Mark White, male   DOB: 10-28-1955, 65 y.o.   MRN: [id number]      Reason for Appointment : Followup for Type 2 Diabetes  History of Present Illness          Diagnosis: Type 2 diabetes mellitus, date of diagnosis:   2013        Past history:  At diagnosis he was having borderline hyperglycemia. He had previously been taking metformin from his PCP. He was prescribed this twice a day but would tend to forget and take it once a day.  His A1c in early 2014 was 7.2   Because of his relatively high A1c of 7.5 and obesity he was started on Trulicity 7.89 mg weekly in 06/2013 Previously in 09/2014 he had made substantial changes in his diet and lost over 20 pounds His A1c with this was also much better than usual at 5.8  Recent history:   Non-insulin hypoglycemic drugs: Metformin ER 750 mg, 2 tablets daily at dinnertime, Amaryl 4 mg daily  His A1c is 7.7, previously 8%  Fructosamine previously 271  Current blood sugar patterns and problems identified: He has nonspecific feeling of malaise or just not feeling good while taking all GLP-1 drugs including Ozempic that was tried in April Did not follow-up for short-term visit as directed Even with increasing his Amaryl to 4 mg his blood sugars are poorly controlled Again has difficulty watching his diet especially with controlling portions and getting some junk food  Weight is up slightly He is still doing some walking or other exercise at least 30 minutes almost every day Current BMI 41.5  Overeat  Side effects from medications have been: Fatigue and nausea from Trulicity  Glucose monitoring:  done every 2 to 3 days      Glucometer:    Accu-Chek  Blood Glucose readings:  AVERAGE 170 RANGE 99-272 with higher readings in the mornings and after breakfast, lowest before dinner    Self-care: The diet that the patient has been following is: No specific diet Diet history:  Typical breakfast is an egg,  with  bread   Physical activity: exercise: See above      Dietician visit: Most recent: 5/14, does not want to pay for this again.      Weight history: Usually ranging from 290-305  Wt Readings from Last 3 Encounters:  02/10/21 (!) 306 lb 9.6 oz (139.1 kg)  11/25/20 (!) 303 lb 6.4 oz (137.6 kg)  09/24/20 (!) 302 lb (137 kg)    Diabetes control:    Lab Results  Component Value Date   HGBA1C 8.0 (H) 11/23/2020   HGBA1C 7.4 (H) 08/07/2020   HGBA1C 7.0 (A) 05/11/2020   Lab Results  Component Value Date   MICROALBUR 0.8 05/07/2020   LDLCALC 61 08/07/2020   CREATININE 1.01 02/08/2021    Lab on 02/08/2021  Component Date Value Ref Range Status   Sodium 02/08/2021 140  135 - 145 mEq/L Final   Potassium 02/08/2021 4.1  3.5 - 5.1 mEq/L Final   Chloride 02/08/2021 104  96 - 112 mEq/L Final   CO2 02/08/2021 27  19 - 32 mEq/L Final   Glucose, Bld 02/08/2021 193 (A) 70 - 99 mg/dL Final   BUN 02/08/2021 16  6 - 23 mg/dL Final   Creatinine, Ser 02/08/2021 1.01  0.40 - 1.50 mg/dL Final   GFR 02/08/2021 78.03  >60.00 mL/min Final   Calculated using the CKD-EPI Creatinine Equation (2021)  Calcium 02/08/2021 9.5  8.4 - 10.5 mg/dL Final    Allergies as of 02/10/2021   No Known Allergies      Medication List        Accurate as of February 10, 2021  8:44 PM. If you have any questions, ask your nurse or doctor.          STOP taking these medications    Ozempic (1 MG/DOSE) 2 MG/1.5ML Sopn Generic drug: Semaglutide (1 MG/DOSE) Stopped by: Elayne Snare, MD       TAKE these medications    Accu-Chek Guide test strip Generic drug: glucose blood USE TO CHECK BLOOD SUGAR ONCE DAILY.**E11.65**   Accu-Chek Guide w/Device Kit 1 each by Does not apply route daily. Use glucose meter to check blood sugar as prescribed.   amLODipine 2.5 MG tablet Commonly known as: NORVASC Take by mouth.   aspirin 81 MG tablet Take 1 tablet (81 mg total) by mouth daily. STOP TAKING AND RESTART Dec 29 2011 What changed: additional instructions   atorvastatin 40 MG tablet Commonly known as: LIPITOR Take 40 mg by mouth daily.   dapagliflozin propanediol 10 MG Tabs tablet Commonly known as: Farxiga Take 1 tablet (10 mg total) by mouth daily before breakfast. Started by: Elayne Snare, MD   glimepiride 2 MG tablet Commonly known as: AMARYL Take 2 tablets (57m total) by mouth daily after supper. What changed: See the new instructions. Changed by: AElayne Snare MD   losartan 100 MG tablet Commonly known as: COZAAR Take by mouth.   metFORMIN 750 MG 24 hr tablet Commonly known as: GLUCOPHAGE-XR TAKE 2 TABLETS (1,500 MG TOTAL) BY MOUTH DAILY WITH SUPPER.   OneTouch Delica Plus [id number] USE TO CHECK BLOOD SUGAR ONCE A DAY DX CODE E11.65        Allergies: No Known Allergies  Past Medical History:  Diagnosis Date   Allergy    Arthritis    knees   Diabetes mellitus [place] complication ([place]    Hyperlipidemia    Hypertension    Personal history of adenomatous colonic polyps 01/19/2011   Sleep apnea    not using CPAP   Ulcer    30 years ago    Past Surgical History:  Procedure Laterality Date   COLONOSCOPY  12/14/2011   Procedure: COLONOSCOPY;  Surgeon: CGatha Mayer MD;  Location: [facility];  Service: Endoscopy;  Laterality: N/A;  colon wiht APC for follow up of colon polyps   COLONOSCOPY N/A 10/10/2012   Procedure: COLONOSCOPY;  Surgeon: CGatha Mayer MD;  Location: [facility];  Service: Endoscopy;  Laterality: N/A;  Need APC   COLONOSCOPY     COLONOSCOPY W/ POLYPECTOMY  01/13/11   3 polyps, largest 3 cm cecal polyp - adenomas   HOT HEMOSTASIS  12/14/2011   Procedure: HOT HEMOSTASIS (ARGON PLASMA COAGULATION/BICAP);  Surgeon: CGatha Mayer MD;  [facility];  Service: Endoscopy;  Laterality: N/A;   VASECTOMY     with sedation     Family History  Problem Relation Age of Onset   Colon cancer Mother 612  Heart disease Mother    Heart disease  Father    Cancer - Other Daughter        glioblastoma- died age 66   Obesity Neg Hx    Sleep apnea Neg Hx    Stroke Neg Hx    Heart attack Neg Hx    Colon polyps Neg Hx    Rectal cancer Neg  Hx    Stomach cancer Neg Hx    Esophageal cancer Neg Hx     Social History:  reports that he has never smoked. He has never used smokeless tobacco. He reports current alcohol use. He reports that he does not use drugs.    Review of Systems       Lipids: Has been on Lipitor for over 10 years for hypercholesterolemia,  followed by PCP. Triglycerides had been controlled with fenofibrate  LDL is controlled; his HDL has been consistently low    Lab Results  Component Value Date   CHOL 114 08/07/2020   HDL 25.50 (L) 08/07/2020   LDLCALC 61 08/07/2020   TRIG 136.0 08/07/2020   CHOLHDL 4 08/07/2020           Has history of sleep apnea   HYPERTENSION: He is on losartan 100 mg and also amlodipine followed by PCP He has been checking his blood pressure at home and blood pressure usually is in the 166A and 630Z systolic  BP Readings from Last 3 Encounters:  02/10/21 110/78  11/25/20 (!) 144/84  09/24/20 (!) 148/82     Last foot exam done with PCP  TSH 2.01 in 6/21  Physical Examination:  BP 110/78   Pulse 64   Ht 6' (1.829 m)   Wt (!) 306 lb 9.6 oz (139.1 kg)   SpO2 95%   BMI 41.58 kg/m    ASSESSMENT:  Diabetes type 2, With obesity    See history of present illness for discussion of current diabetes management, blood sugar patterns and problems identified  His A1c is 7.7 and only slightly better, previously has been as low as 6.2  Currently only on Amaryl 4 mg and Metformin Most of his sugars are high especially fasting including a reading of 193 in the lab  Intolerant to GLP-1 drugs Previously had not been able to afford [place]  HYPERTENSION: Followed by PCP and cardiologist Previously blood pressure relatively higher at home with systolic over 601 or 093 but  today it is lower    PLAN:  For diabetes: He will start [place] half of the 10 mg daily Again discussed benefits of SGLT2 drugs and possible side effects He will try to get a online 30-day free trial coupon Also check on the cost of this long-term He does need to significantly improve his diet especially with snacks and junk food as well as portions  He will also likely need to reduce or stop his amlodipine his blood pressure is starting to be low normal as today at home also Continue regular exercise  Amaryl 2 mg at dinnertime about a week after starting Farxiga instead of 4 mg  No change in metformin    Patient Instructions  If BP goes below 120 cut amlodipine  Glimeperide 1 pill a week after starting [employer] 02/10/2021, 8:44 PM

## 2021-02-10 NOTE — Patient Instructions (Signed)
If BP goes below 120 cut amlodipine  Glimeperide 1 pill a week after starting [place]

## 2021-02-11 ENCOUNTER — Other Ambulatory Visit: Payer: Self-pay

## 2021-02-11 LAB — FRUCTOSAMINE: Fructosamine: 278 umol/L (ref 0–285)

## 2021-02-11 LAB — POCT GLYCOSYLATED HEMOGLOBIN (HGB A1C): Hemoglobin A1C: 7.7 % — AB (ref 4.0–5.6)

## 2021-02-11 NOTE — Addendum Note (Signed)
Addended by: Cinda Quest on: 02/11/2021 08:08 AM   Modules accepted: Orders

## 2021-03-22 ENCOUNTER — Other Ambulatory Visit: Payer: Self-pay

## 2021-03-22 ENCOUNTER — Other Ambulatory Visit (INDEPENDENT_AMBULATORY_CARE_PROVIDER_SITE_OTHER): Payer: Medicare Other

## 2021-03-22 DIAGNOSIS — E1165 Type 2 diabetes mellitus with hyperglycemia: Secondary | ICD-10-CM | POA: Diagnosis not present

## 2021-03-22 LAB — BASIC METABOLIC PANEL
BUN: 17 mg/dL (ref 6–23)
CO2: 27 mEq/L (ref 19–32)
Calcium: 9.8 mg/dL (ref 8.4–10.5)
Chloride: 104 mEq/L (ref 96–112)
Creatinine, Ser: 1.12 mg/dL (ref 0.40–1.50)
GFR: 68.87 mL/min (ref >60.00)
Glucose, Bld: 188 mg/dL — ABNORMAL HIGH (ref 70–99)
Potassium: 4.6 mEq/L (ref 3.5–5.1)
Sodium: 140 mEq/L (ref 135–145)

## 2021-03-23 LAB — FRUCTOSAMINE: Fructosamine: 275 umol/L (ref 0–285)

## 2021-03-24 ENCOUNTER — Ambulatory Visit (INDEPENDENT_AMBULATORY_CARE_PROVIDER_SITE_OTHER): Payer: Medicare Other | Admitting: Endocrinology

## 2021-03-24 ENCOUNTER — Encounter: Payer: Self-pay | Admitting: Endocrinology

## 2021-03-24 ENCOUNTER — Other Ambulatory Visit: Payer: Self-pay

## 2021-03-24 VITALS — BP 138/82 | HR 71 | Ht 72.0 in | Wt 306.0 lb

## 2021-03-24 DIAGNOSIS — E1165 Type 2 diabetes mellitus with hyperglycemia: Secondary | ICD-10-CM

## 2021-03-24 MED ORDER — EMPAGLIFLOZIN 25 MG PO TABS: 25.0000 mg | ORAL_TABLET | Freq: Every day | ORAL | 2 refills | Status: DC

## 2021-03-24 NOTE — Progress Notes (Signed)
Patient ID: Mark White, male   DOB: [birth year], 65 y.o.   MRN: [id number]      Reason for Appointment : Followup for Type 2 Diabetes  History of Present Illness          Diagnosis: Type 2 diabetes mellitus, date of diagnosis:   2013        Past history:  At diagnosis he was having borderline hyperglycemia. He had previously been taking metformin from his PCP. He was prescribed this twice a day but would tend to forget and take it once a day.  His A1c in early 2014 was 7.2   Because of his relatively high A1c of 7.5 and obesity he was started on Trulicity 0.93 mg weekly in 06/2013 Previously in 09/2014 he had made substantial changes in his diet and lost over 20 pounds His A1c with this was also much better than usual at 5.8  Recent history:   Non-insulin hypoglycemic drugs: Metformin ER 750 mg, 2 tablets daily at dinnertime, Amaryl 4 mg daily  His A1c is 7.7, previously 8%  Fructosamine 275, was 278   Current blood sugar patterns and problems identified: He was told to start [place] with a half a tablet but because of expense he did not fill this prescription again However he did not let us know that he could not get the prescription Previously has reported nonspecific feeling of malaise or just not feeling good while taking all GLP-1 drugs including Ozempic His blood sugars are still about the same as before He says he has been traveling and eating out more and has not watched his diet or exercise as much Weight is unchanged His blood sugars do not appear to be higher in the afternoon and evening compared to the morning readings, has only 1 unusually high reading of 287 late morning   Side effects from medications have been: Fatigue and nausea from Trulicity  Glucose monitoring:  done every 2 to 3 days      Glucometer:    Accu-Chek  Blood Glucose readings:  MORNING readings recently 156-240 average 195, nonfasting readings 145-287 AVERAGE overall 197,  previously 170   Self-care: The diet that the patient has been following is: No specific diet Diet history:  Typical breakfast is an egg,  with bread        Dietician visit: Most recent: 5/14, does not want to pay for this again.      Weight history: Usually ranging from 290-305  Wt Readings from Last 3 Encounters:  03/24/21 (!) 306 lb (138.8 kg)  02/10/21 (!) 306 lb 9.6 oz (139.1 kg)  11/25/20 (!) 303 lb 6.4 oz (137.6 kg)    Diabetes control:    Lab Results  Component Value Date   HGBA1C 7.7 (A) 02/10/2021   HGBA1C 8.0 (H) 11/23/2020   HGBA1C 7.4 (H) 08/07/2020   Lab Results  Component Value Date   MICROALBUR 0.8 05/07/2020   LDLCALC 61 08/07/2020   CREATININE 1.12 03/22/2021   Lab Results  Component Value Date   FRUCTOSAMINE 275 03/22/2021   FRUCTOSAMINE 278 02/08/2021   FRUCTOSAMINE 271 09/22/2020     Lab on 03/22/2021  Component Date Value Ref Range Status   Fructosamine 03/22/2021 275  0 - 285 umol/L Final   Comment: Published reference interval for apparently healthy subjects between age 6 and 62 is 93 - 285 umol/L and in a poorly controlled diabetic population is 228 - 563 umol/L with a mean of 396  umol/L.    Sodium 03/22/2021 140  135 - 145 mEq/L Final   Potassium 03/22/2021 4.6  3.5 - 5.1 mEq/L Final   Chloride 03/22/2021 104  96 - 112 mEq/L Final   CO2 03/22/2021 27  19 - 32 mEq/L Final   Glucose, Bld 03/22/2021 188 (A) 70 - 99 mg/dL Final   BUN 03/22/2021 17  6 - 23 mg/dL Final   Creatinine, Ser 03/22/2021 1.12  0.40 - 1.50 mg/dL Final   GFR 03/22/2021 68.87  >60.00 mL/min Final   Calculated using the CKD-EPI Creatinine Equation (2021)   Calcium 03/22/2021 9.8  8.4 - 10.5 mg/dL Final    Allergies as of 03/24/2021   No Known Allergies      Medication List        Accurate as of March 24, 2021  3:27 PM. If you have any questions, ask your nurse or doctor.          Accu-Chek Guide test strip Generic drug: glucose blood USE TO CHECK  BLOOD SUGAR ONCE DAILY.**E11.65**   Accu-Chek Guide w/Device Kit 1 each by Does not apply route daily. Use glucose meter to check blood sugar as prescribed.   amLODipine 2.5 MG tablet Commonly known as: NORVASC Take by mouth.   aspirin 81 MG tablet Take 1 tablet (81 mg total) by mouth daily. STOP TAKING AND RESTART Dec 29 2011 What changed: additional instructions   atorvastatin 40 MG tablet Commonly known as: LIPITOR Take 40 mg by mouth daily.   dapagliflozin propanediol 10 MG Tabs tablet Commonly known as: Farxiga Take 1 tablet (10 mg total) by mouth daily before breakfast.   glimepiride 2 MG tablet Commonly known as: AMARYL Take 2 tablets (75m total) by mouth daily after supper.   losartan 100 MG tablet Commonly known as: COZAAR Take by mouth.   metFORMIN 750 MG 24 hr tablet Commonly known as: GLUCOPHAGE-XR TAKE 2 TABLETS (1,500 MG TOTAL) BY MOUTH DAILY WITH SUPPER.   OneTouch Delica Plus [id number] USE TO CHECK BLOOD SUGAR ONCE A DAY DX CODE E11.65        Allergies: No Known Allergies  Past Medical History:  Diagnosis Date   Allergy    Arthritis    knees   Diabetes mellitus without complication ([place]    Hyperlipidemia    Hypertension    Personal history of adenomatous colonic polyps 01/19/2011   Sleep apnea    not using CPAP   Ulcer    30 years ago    Past Surgical History:  Procedure Laterality Date   COLONOSCOPY  12/14/2011   Procedure: COLONOSCOPY;  Surgeon: CGatha Mayer MD;  Location: [facility];  Service: Endoscopy;  Laterality: N/A;  colon wiht APC for follow up of colon polyps   COLONOSCOPY N/A 10/10/2012   Procedure: COLONOSCOPY;  Surgeon: CGatha Mayer MD;  Location: [facility];  Service: Endoscopy;  Laterality: N/A;  Need APC   COLONOSCOPY     COLONOSCOPY W/ POLYPECTOMY  01/13/11   3 polyps, largest 3 cm cecal polyp - adenomas   HOT HEMOSTASIS  12/14/2011   Procedure: HOT HEMOSTASIS (ARGON PLASMA COAGULATION/BICAP);  Surgeon:  CGatha Mayer MD;  [facility];  Service: Endoscopy;  Laterality: N/A;   VASECTOMY     with sedation     Family History  Problem Relation Age of Onset   Colon cancer Mother 644  Heart disease Mother    Heart disease Father    Cancer - Other Daughter  glioblastoma- died age 29    Obesity Neg Hx    Sleep apnea Neg Hx    Stroke Neg Hx    Heart attack Neg Hx    Colon polyps Neg Hx    Rectal cancer Neg Hx    Stomach cancer Neg Hx    Esophageal cancer Neg Hx     Social History:  reports that he has never smoked. He has never used smokeless tobacco. He reports current alcohol use. He reports that he does not use drugs.    Review of Systems       Lipids: Has been on Lipitor for over 10 years for hypercholesterolemia,  followed by PCP. Triglycerides had been controlled with fenofibrate  LDL is controlled; his HDL has been consistently low    Lab Results  Component Value Date   CHOL 114 08/07/2020   HDL 25.50 (L) 08/07/2020   LDLCALC 61 08/07/2020   TRIG 136.0 08/07/2020   CHOLHDL 4 08/07/2020           Has history of sleep apnea   HYPERTENSION: He is on losartan 100 mg and also amlodipine followed by PCP  He has been checking his blood pressure at home but not lately His blood pressure was much better after a few minutes in the office compared to first reading  BP Readings from Last 3 Encounters:  03/24/21 138/82  02/10/21 110/78  11/25/20 (!) 144/84     Last foot exam done with PCP  TSH 2.01 in 6/21  Physical Examination:  BP 138/82   Pulse 71   Ht 6' (1.829 m)   Wt (!) 306 lb (138.8 kg)   SpO2 94%   BMI 41.50 kg/m    ASSESSMENT:  Diabetes type 2, With obesity    See history of present illness for discussion of current diabetes management, blood sugar patterns and problems identified  His A1c is 7.7 last although fructosamine is not high despite his blood sugars averaging 197 currently  Previous best A1c was 6.2  Currently  only on Amaryl 4 mg and Metformin His sugars are still poorly controlled and he has not lost any weight  Intolerant to GLP-1 drugs Again has not been able to afford [place]    PLAN:  For diabetes: He will check on the availability of Jardiance on his formulary and his cost, trial prescription sent If he is not able to get this he will start a basal insulin, advised him to call us in a couple of days with the information   Discussed how basal insulin works, timing of injection, dosage, injection sites.  Also discussed titration based on fasting blood sugar every 3 days by 2 units and at target of 90-130 for fasting reading.  He will also check on the coverage for his insurance for Lantus or Tyler Aas  Likely will start him on 10 units  Encouraged him to start watching his diet better and get back on his walking program     Patient Instructions  Check into Jardiance or Farxiga Insulin brands Lantus or Tresiba  Check blood sugars on waking 5 days a week  Also check blood sugars about 2 hours after meals and do this after different meals by rotation  Recommended blood sugar levels on waking up are 90-130 and about 2 hours after meal is 130-160  Please bring your blood sugar monitor to each visit, thank you   Elayne Snare 03/24/2021, 3:27 PM

## 2021-03-24 NOTE — Patient Instructions (Addendum)
Check into Jardiance or Farxiga Insulin brands Lantus or Tresiba  Check blood sugars on waking 5 days a week  Also check blood sugars about 2 hours after meals and do this after different meals by rotation  Recommended blood sugar levels on waking up are 90-130 and about 2 hours after meal is 130-160  Please bring your blood sugar monitor to each visit, thank you

## 2021-03-26 ENCOUNTER — Telehealth: Payer: Self-pay | Admitting: Endocrinology

## 2021-03-26 DIAGNOSIS — E1165 Type 2 diabetes mellitus with hyperglycemia: Secondary | ICD-10-CM

## 2021-03-26 NOTE — Telephone Encounter (Signed)
Pt wanted to leave Dr. Dwyane Dee a message he voiced that The jardiance cost the same as farxiga so lets try the morning insulin.

## 2021-03-29 ENCOUNTER — Other Ambulatory Visit: Payer: Self-pay | Admitting: Endocrinology

## 2021-03-29 DIAGNOSIS — E1165 Type 2 diabetes mellitus with hyperglycemia: Secondary | ICD-10-CM

## 2021-03-29 MED ORDER — LANTUS SOLOSTAR 100 UNIT/ML ~~LOC~~ SOPN
PEN_INJECTOR | SUBCUTANEOUS | 3 refills | Status: DC
Start: 2021-03-29 — End: 2021-03-30

## 2021-03-29 NOTE — Telephone Encounter (Signed)
Rx sent and mychart message sent to patient to notify him of the new medication and how to take it.

## 2021-04-07 ENCOUNTER — Other Ambulatory Visit: Payer: Self-pay

## 2021-04-07 DIAGNOSIS — E1165 Type 2 diabetes mellitus with hyperglycemia: Secondary | ICD-10-CM

## 2021-04-07 MED ORDER — PEN NEEDLES 31G X 8 MM MISC: 3 refills | Status: DC

## 2021-05-03 ENCOUNTER — Other Ambulatory Visit: Payer: Self-pay

## 2021-05-03 ENCOUNTER — Other Ambulatory Visit (INDEPENDENT_AMBULATORY_CARE_PROVIDER_SITE_OTHER): Payer: Medicare Other

## 2021-05-03 DIAGNOSIS — E1165 Type 2 diabetes mellitus with hyperglycemia: Secondary | ICD-10-CM | POA: Diagnosis not present

## 2021-05-03 LAB — BASIC METABOLIC PANEL
BUN: 13 mg/dL (ref 6–23)
CO2: 26 mEq/L (ref 19–32)
Calcium: 9.4 mg/dL (ref 8.4–10.5)
Chloride: 105 mEq/L (ref 96–112)
Creatinine, Ser: 1.02 mg/dL (ref 0.40–1.50)
GFR: 76.99 mL/min (ref >60.00)
Glucose, Bld: 184 mg/dL — ABNORMAL HIGH (ref 70–99)
Potassium: 4.4 mEq/L (ref 3.5–5.1)
Sodium: 139 mEq/L (ref 135–145)

## 2021-05-03 LAB — HEMOGLOBIN A1C: Hgb A1c MFr Bld: 7.8 % — ABNORMAL HIGH (ref 4.6–6.5)

## 2021-05-05 ENCOUNTER — Other Ambulatory Visit: Payer: Self-pay

## 2021-05-05 ENCOUNTER — Ambulatory Visit (INDEPENDENT_AMBULATORY_CARE_PROVIDER_SITE_OTHER): Payer: Medicare Other | Admitting: Endocrinology

## 2021-05-05 ENCOUNTER — Other Ambulatory Visit: Payer: Self-pay | Admitting: Endocrinology

## 2021-05-05 ENCOUNTER — Encounter: Payer: Self-pay | Admitting: Endocrinology

## 2021-05-05 VITALS — BP 128/78 | HR 66 | Ht 72.0 in | Wt 304.0 lb

## 2021-05-05 DIAGNOSIS — E1165 Type 2 diabetes mellitus with hyperglycemia: Secondary | ICD-10-CM

## 2021-05-05 DIAGNOSIS — I10 Essential (primary) hypertension: Secondary | ICD-10-CM

## 2021-05-05 MED ORDER — METFORMIN HCL ER 750 MG PO TB24: ORAL_TABLET | ORAL | 3 refills | Status: DC

## 2021-05-05 MED ORDER — ACCU-CHEK GUIDE VI STRP: ORAL_STRIP | 3 refills | Status: DC

## 2021-05-05 NOTE — Patient Instructions (Addendum)
Check blood sugars on waking up 7 days a week  Also check blood sugars about 2 hours after meals and do this after different meals by rotation  Recommended blood sugar levels on waking up are 90-130 and about 2 hours after meal is 130-160  Please bring your blood sugar monitor to each visit, thank you   Glimeperide 2mg  twice daily  Metformin 1 in am and 2 in pm  Take 10 units daily

## 2021-05-05 NOTE — Progress Notes (Signed)
Patient ID: Mark White, male   DOB: 1956/05/04, 65 y.o.   MRN: [id number]      Reason for Appointment : Followup for Type 2 Diabetes  History of Present Illness          Diagnosis: Type 2 diabetes mellitus, date of diagnosis:   2013        Past history:  At diagnosis he was having borderline hyperglycemia. He had previously been taking metformin from his PCP. He was prescribed this twice a day but would tend to forget and take it once a day.  His A1c in early 2014 was 7.2   Because of his relatively high A1c of 7.5 and obesity he was started on Trulicity 3.21 mg weekly in 06/2013 Previously in 09/2014 he had made substantial changes in his diet and lost over 20 pounds His A1c with this was also much better than usual at 5.8  Recent history:   Non-insulin hypoglycemic drugs: Metformin ER 750 mg, 2 tablets daily at dinnertime, Amaryl 4 mg daily  INSULIN regimen: Lantus 8 to 10 units at night  His A1c is about the same at 7.8  Fructosamine 275, was 278   Current blood sugar patterns and problems identified: He was told to start [place] but he could not afford it and recently has no known insulin as directed  After his last visit in late August he says that he was doing a lot of walking and physical activity and his blood sugars were sometimes near normal in the mornings  He only has taken 8-10 units of the Lantus insulin and has not increased it despite recently his sugars being mostly over 130 target  Also he misunderstood instructions and is only taking the insulin when his blood sugars are over 130 in the morning  Overall however his blood sugars are improved from his last visit He does however appear to have relatively high readings midday or after breakfast  This is despite having 2 eggs for protein in the morning  Recently has been somewhat irregular with his walking or other exercise program  Diet is somewhat variable but he has lost 2 pounds  Lab glucose late  morning was 184 fasting   Side effects from medications have been: Fatigue and nausea from Trulicity  Glucose monitoring:       Glucometer:    Accu-Chek  Blood Glucose readings:   PRE-MEAL Fasting Lunch Dinner Bedtime Overall  Glucose range: 103-163    103-232  Mean/median: 148 222 104  151   POST-MEAL PC Breakfast PC Lunch PC Dinner  Glucose range:   ?  Mean/median:       Previously:  MORNING readings recently 156-240 average 195, nonfasting readings 145-287 AVERAGE overall 197, previously 170   Self-care: The diet that the patient has been following is: No specific diet Diet history:  Typical breakfast is an egg,  with bread        Dietician visit: Most recent: 5/14, does not want to pay for this again.      Weight history: Usually ranging from 290-305  Wt Readings from Last 3 Encounters:  05/05/21 (!) 304 lb (137.9 kg)  03/24/21 (!) 306 lb (138.8 kg)  02/10/21 (!) 306 lb 9.6 oz (139.1 kg)    Diabetes control:    Lab Results  Component Value Date   HGBA1C 7.8 (H) 05/03/2021   HGBA1C 7.7 (A) 02/10/2021   HGBA1C 8.0 (H) 11/23/2020   Lab Results  Component Value  Date   MICROALBUR 0.8 05/07/2020   LDLCALC 61 08/07/2020   CREATININE 1.02 05/03/2021   Lab Results  Component Value Date   FRUCTOSAMINE 275 03/22/2021   FRUCTOSAMINE 278 02/08/2021   FRUCTOSAMINE 271 09/22/2020     Lab on 05/03/2021  Component Date Value Ref Range Status   Sodium 05/03/2021 139  135 - 145 mEq/L Final   Potassium 05/03/2021 4.4  3.5 - 5.1 mEq/L Final   Chloride 05/03/2021 105  96 - 112 mEq/L Final   CO2 05/03/2021 26  19 - 32 mEq/L Final   Glucose, Bld 05/03/2021 184 (A) 70 - 99 mg/dL Final   BUN 05/03/2021 13  6 - 23 mg/dL Final   Creatinine, Ser 05/03/2021 1.02  0.40 - 1.50 mg/dL Final   GFR 05/03/2021 76.99  >60.00 mL/min Final   Calculated using the CKD-EPI Creatinine Equation (2021)   Calcium 05/03/2021 9.4  8.4 - 10.5 mg/dL Final   Hgb A1c MFr Bld 05/03/2021 7.8  (A) 4.6 - 6.5 % Final   Glycemic Control Guidelines for People with Diabetes:Non Diabetic:  <6%Goal of Therapy: <7%Additional Action Suggested:  >8%     Allergies as of 05/05/2021   No Known Allergies      Medication List        Accurate as of May 05, 2021 10:42 AM. If you have any questions, ask your nurse or doctor.          Accu-Chek Guide test strip Generic drug: glucose blood USE TO CHECK BLOOD SUGAR ONCE DAILY.**E11.65**   Accu-Chek Guide w/Device Kit 1 each by Does not apply route daily. Use glucose meter to check blood sugar as prescribed.   amLODipine 2.5 MG tablet Commonly known as: NORVASC Take by mouth.   aspirin 81 MG tablet Take 1 tablet (81 mg total) by mouth daily. STOP TAKING AND RESTART Dec 29 2011 What changed: additional instructions   atorvastatin 40 MG tablet Commonly known as: LIPITOR Take 40 mg by mouth daily.   empagliflozin 25 MG Tabs tablet Commonly known as: Jardiance Take 1 tablet (25 mg total) by mouth daily before breakfast.   glimepiride 2 MG tablet Commonly known as: AMARYL Take 2 tablets (92m total) by mouth daily after supper.   insulin glargine 100 UNIT/ML Solostar Pen Commonly known as: LANTUS Inject up to 15 units every morning   losartan 100 MG tablet Commonly known as: COZAAR Take by mouth.   metFORMIN 750 MG 24 hr tablet Commonly known as: GLUCOPHAGE-XR TAKE 2 TABLETS (1,500 MG TOTAL) BY MOUTH DAILY WITH SUPPER.   OneTouch Delica Plus [id number] USE TO CHECK BLOOD SUGAR ONCE A DAY DX CODE E11.65   Pen Needles 31G X 8 MM Misc Use to inject insulin daily        Allergies: No Known Allergies  Past Medical History:  Diagnosis Date   Allergy    Arthritis    knees   Diabetes mellitus without complication ([place]    Hyperlipidemia    Hypertension    Personal history of adenomatous colonic polyps 01/19/2011   Sleep apnea    not using CPAP   Ulcer    30 years ago    Past Surgical History:   Procedure Laterality Date   COLONOSCOPY  12/14/2011   Procedure: COLONOSCOPY;  Surgeon: CGatha Mayer MD;  Location: [facility];  Service: Endoscopy;  Laterality: N/A;  colon wiht APC for follow up of colon polyps   COLONOSCOPY N/A 10/10/2012   Procedure: COLONOSCOPY;  Surgeon: Gatha Mayer, MD;  Location: [facility];  Service: Endoscopy;  Laterality: N/A;  Need APC   COLONOSCOPY     COLONOSCOPY W/ POLYPECTOMY  01/13/11   3 polyps, largest 3 cm cecal polyp - adenomas   HOT HEMOSTASIS  12/14/2011   Procedure: HOT HEMOSTASIS (ARGON PLASMA COAGULATION/BICAP);  Surgeon: Gatha Mayer, MD;  Location: [facility];  Service: Endoscopy;  Laterality: N/A;   VASECTOMY     with sedation     Family History  Problem Relation Age of Onset   Colon cancer Mother 14   Heart disease Mother    Heart disease Father    Cancer - Other Daughter        glioblastoma- died age 88    Obesity Neg Hx    Sleep apnea Neg Hx    Stroke Neg Hx    Heart attack Neg Hx    Colon polyps Neg Hx    Rectal cancer Neg Hx    Stomach cancer Neg Hx    Esophageal cancer Neg Hx     Social History:  reports that he has never smoked. He has never used smokeless tobacco. He reports current alcohol use. He reports that he does not use drugs.    Review of Systems       Lipids: Has been on Lipitor for over 10 years for hypercholesterolemia,  followed by PCP. Triglycerides had been controlled with fenofibrate  LDL is controlled; his HDL has been consistently low    Lab Results  Component Value Date   CHOL 114 08/07/2020   HDL 25.50 (L) 08/07/2020   LDLCALC 61 08/07/2020   TRIG 136.0 08/07/2020   CHOLHDL 4 08/07/2020           Has history of sleep apnea   HYPERTENSION: He is on losartan 100 mg and also amlodipine followed by PCP  He has not been checking his blood pressure at home   BP Readings from Last 3 Encounters:  05/05/21 128/78  03/24/21 138/82  02/10/21 110/78     Last foot exam done with  PCP  TSH 2.01 in 6/21  Physical Examination:  BP 128/78 (BP Location: Right Arm, Patient Position: Sitting, Cuff Size: Normal)   Pulse 66   Ht 6' (1.829 m)   Wt (!) 304 lb (137.9 kg)   SpO2 99%   BMI 41.23 kg/m   Diabetic Foot Exam - Simple   Simple Foot Form Diabetic Foot exam was performed with the following findings: Yes   Visual Inspection No deformities, no ulcerations, no other skin breakdown bilaterally: Yes Sensation Testing Intact to touch and monofilament testing bilaterally: Yes Pulse Check Posterior Tibialis and Dorsalis pulse intact bilaterally: Yes Comments     ASSESSMENT:  Diabetes type 2, With obesity    See history of present illness for discussion of current diabetes management, blood sugar patterns and problems identified  His A1c is 7.8  Previous best A1c was 6.2  Currently in addition to Lantus he is on Amaryl 4 mg and Metformin ER 1500 mg His sugars are improved even with taking only small intermittent doses of basal insulin in the last 6 weeks  HYPERTENSION: Recently well controlled   PLAN:  For diabetes: He will need to start taking his Lantus insulin every night and send readings are relatively higher he will go to 10 units daily  Discussed need to continue basal insulin to keep his sugars overnight consistently controlled  Again given him the flowsheet with the  algorithm for adjusting the dose  He will only reduce the dose by 2 units if morning sugars are going below 90   Emphasized the need to check readings AFTER meals  Discussed blood sugar targets after meals Since her sugars are relatively higher midday he can change his GLIMEPIRIDE to 2 mg twice daily instead of all in the evening  Increase METFORMIN to 3 tablets instead of current 1500 mg  Check blood pressure periodically at home  Encouraged him to start watching his diet better and be consistent on his walking program  Total visit time including counseling = 30  minutes   Patient Instructions  Check blood sugars on waking up 7 days a week  Also check blood sugars about 2 hours after meals and do this after different meals by rotation  Recommended blood sugar levels on waking up are 90-130 and about 2 hours after meal is 130-160  Please bring your blood sugar monitor to each visit, thank you   Glimeperide 43m twice daily  Metformin 1 in am and 2 in pm  Take 10 units daily    Vere Diantonio 05/05/2021, 10:42 AM

## 2021-05-10 ENCOUNTER — Other Ambulatory Visit: Payer: Self-pay | Admitting: Endocrinology

## 2021-05-10 MED ORDER — ACCU-CHEK FASTCLIX LANCETS MISC: 1 refills | Status: DC

## 2021-05-13 ENCOUNTER — Other Ambulatory Visit: Payer: Self-pay | Admitting: Endocrinology

## 2021-05-13 DIAGNOSIS — E1165 Type 2 diabetes mellitus with hyperglycemia: Secondary | ICD-10-CM

## 2021-07-05 ENCOUNTER — Other Ambulatory Visit: Payer: Self-pay

## 2021-07-05 ENCOUNTER — Other Ambulatory Visit (INDEPENDENT_AMBULATORY_CARE_PROVIDER_SITE_OTHER): Payer: Medicare Other

## 2021-07-05 DIAGNOSIS — E1165 Type 2 diabetes mellitus with hyperglycemia: Secondary | ICD-10-CM | POA: Diagnosis not present

## 2021-07-05 LAB — BASIC METABOLIC PANEL
BUN: 13 mg/dL (ref 6–23)
CO2: 28 mEq/L (ref 19–32)
Calcium: 9.6 mg/dL (ref 8.4–10.5)
Chloride: 105 mEq/L (ref 96–112)
Creatinine, Ser: 0.95 mg/dL (ref 0.40–1.50)
GFR: 83.74 mL/min (ref >60.00)
Glucose, Bld: 166 mg/dL — ABNORMAL HIGH (ref 70–99)
Potassium: 4.7 mEq/L (ref 3.5–5.1)
Sodium: 141 mEq/L (ref 135–145)

## 2021-07-05 LAB — MICROALBUMIN / CREATININE URINE RATIO
Creatinine,U: 139.1 mg/dL
Microalb Creat Ratio: 0.5 mg/g (ref 0.0–30.0)
Microalb, Ur: 0.7 mg/dL (ref 0.0–1.9)

## 2021-07-06 LAB — FRUCTOSAMINE: Fructosamine: 253 umol/L (ref 0–285)

## 2021-07-07 ENCOUNTER — Other Ambulatory Visit: Payer: Self-pay

## 2021-07-07 ENCOUNTER — Ambulatory Visit (INDEPENDENT_AMBULATORY_CARE_PROVIDER_SITE_OTHER): Payer: Medicare Other | Admitting: Endocrinology

## 2021-07-07 ENCOUNTER — Encounter: Payer: Self-pay | Admitting: Endocrinology

## 2021-07-07 VITALS — BP 144/70 | HR 74 | Ht 72.0 in | Wt 306.8 lb

## 2021-07-07 DIAGNOSIS — E1165 Type 2 diabetes mellitus with hyperglycemia: Secondary | ICD-10-CM | POA: Diagnosis not present

## 2021-07-07 DIAGNOSIS — I10 Essential (primary) hypertension: Secondary | ICD-10-CM

## 2021-07-07 DIAGNOSIS — Z794 Long term (current) use of insulin: Secondary | ICD-10-CM

## 2021-07-07 DIAGNOSIS — E782 Mixed hyperlipidemia: Secondary | ICD-10-CM | POA: Diagnosis not present

## 2021-07-07 NOTE — Progress Notes (Signed)
Patient ID: Mark White, male   DOB: [birth year], 65 y.o.   MRN: [id number]      Reason for Appointment : Followup for Type 2 Diabetes  History of Present Illness          Diagnosis: Type 2 diabetes mellitus, date of diagnosis:   2013        Past history:  At diagnosis he was having borderline hyperglycemia. He had previously been taking metformin from his PCP. He was prescribed this twice a day but would tend to forget and take it once a day.  His A1c in early 2014 was 7.2   Because of his relatively high A1c of 7.5 and obesity he was started on Trulicity 7.20 mg weekly in 06/2013 Previously in 09/2014 he had made substantial changes in his diet and lost over 20 pounds His A1c with this was also much better than usual at 5.8  Recent history:   Non-insulin hypoglycemic drugs: Metformin ER 750 mg, 2 tablets daily at dinnertime, Amaryl 4 mg daily  INSULIN regimen: Lantus 14 units at night  His A1c is last the same at 7.8  Fructosamine 253 compared to previous 275  Current control, blood sugar patterns and problems identified: He was told to increase the Lantus insulin in October when his fasting readings are still relatively high and also he was taking it only when blood sugars were over 130 Also was told to increase his METFORMIN to 3 tablets  He now says that he had better blood sugars with increasing his Lantus gradually and also with the metformin increase  However about 2 or 3 weeks ago he apparently went back to taking only 1 tablet metformin twice daily  Since then his blood sugars have been mostly high Also he has been less active the last 2 or 3 weeks  Even with insulin his weight is up only 2 pounds  He has only 1 reading after breakfast and only a couple of readings in the evenings His blood sugars appear to be higher after his breakfast when he is eating 2 slices of bread even with eggs  He is taking his Amaryl both tablets at bedtime   Side effects from  medications have been: Fatigue and nausea from Trulicity  Glucose monitoring:       Glucometer:    Accu-Chek  Blood Glucose readings:   PRE-MEAL Fasting Lunch Dinner Bedtime Overall  Glucose range: 116-182    108-2 221  Mean/median: 141    145 +/-24   POST-MEAL PC Breakfast PC Lunch PC Dinner  Glucose range: 221    Mean/median:  133 177   Previously:  PRE-MEAL Fasting Lunch Dinner Bedtime Overall  Glucose range: 103-163    103-232  Mean/median: 148 222 104  151   POST-MEAL PC Breakfast PC Lunch PC Dinner  Glucose range:   ?  Mean/median:       Previously:  MORNING readings recently 156-240 average 195, nonfasting readings 145-287 AVERAGE overall 197, previously 170   Self-care: The diet that the patient has been following is: No specific diet Diet history:  Typical breakfast is an egg,  with bread        Dietician visit: Most recent: 5/14, does not want to pay for this again.      Weight history: Usually ranging from 290-305  Wt Readings from Last 3 Encounters:  07/07/21 (!) 306 lb 12.8 oz (139.2 kg)  05/05/21 (!) 304 lb (137.9 kg)  03/24/21 Marland Kitchen)  306 lb (138.8 kg)    Diabetes control:    Lab Results  Component Value Date   HGBA1C 7.8 (H) 05/03/2021   HGBA1C 7.7 (A) 02/10/2021   HGBA1C 8.0 (H) 11/23/2020   Lab Results  Component Value Date   MICROALBUR 0.7 07/05/2021   LDLCALC 61 08/07/2020   CREATININE 0.95 07/05/2021   Lab Results  Component Value Date   FRUCTOSAMINE 253 07/05/2021   FRUCTOSAMINE 275 03/22/2021   FRUCTOSAMINE 278 02/08/2021     Lab on 07/05/2021  Component Date Value Ref Range Status   Microalb, Ur 07/05/2021 0.7  0.0 - 1.9 mg/dL Final   Creatinine,U 07/05/2021 139.1  mg/dL Final   Microalb Creat Ratio 07/05/2021 0.5  0.0 - 30.0 mg/g Final   Fructosamine 07/05/2021 253  0 - 285 umol/L Final   Comment: Published reference interval for apparently healthy subjects between age 14 and 83 is 34 - 285 umol/L and in a  poorly controlled diabetic population is 228 - 563 umol/L with a mean of 396 umol/L.    Sodium 07/05/2021 141  135 - 145 mEq/L Final   Potassium 07/05/2021 4.7  3.5 - 5.1 mEq/L Final   Chloride 07/05/2021 105  96 - 112 mEq/L Final   CO2 07/05/2021 28  19 - 32 mEq/L Final   Glucose, Bld 07/05/2021 166 (H)  70 - 99 mg/dL Final   BUN 07/05/2021 13  6 - 23 mg/dL Final   Creatinine, Ser 07/05/2021 0.95  0.40 - 1.50 mg/dL Final   GFR 07/05/2021 83.74  >60.00 mL/min Final   Calculated using the CKD-EPI Creatinine Equation (2021)   Calcium 07/05/2021 9.6  8.4 - 10.5 mg/dL Final    Allergies as of 07/07/2021   No Known Allergies      Medication List        Accurate as of July 07, 2021  3:08 PM. If you have any questions, ask your nurse or doctor.          Accu-Chek Guide test strip Generic drug: glucose blood CHECK SUGAR 3 TIMES DAILY   Accu-Chek Guide w/Device Kit 1 each by Does not apply route daily. Use glucose meter to check blood sugar as prescribed.   amLODipine 2.5 MG tablet Commonly known as: NORVASC Take by mouth.   aspirin 81 MG tablet Take 1 tablet (81 mg total) by mouth daily. STOP TAKING AND RESTART Dec 29 2011 What changed: additional instructions   atorvastatin 40 MG tablet Commonly known as: LIPITOR Take 40 mg by mouth daily.   glimepiride 2 MG tablet Commonly known as: AMARYL TAKE 2 TABLETS (4MG TOTAL) BY MOUTH DAILY AFTER SUPPER.   insulin glargine 100 UNIT/ML Solostar Pen Commonly known as: LANTUS Inject up to 15 units every morning   losartan 100 MG tablet Commonly known as: COZAAR Take by mouth.   metFORMIN 750 MG 24 hr tablet Commonly known as: GLUCOPHAGE-XR 1 tablet at breakfast and 2 tablets at dinner   OneTouch Delica Plus [id number] Misc USE TO CHECK BLOOD SUGAR ONCE A DAY DX CODE E11.65   Accu-Chek Softclix Lancets lancets CHECK SUGARS 3 TIMES DAILY E11.65   Pen Needles 31G X 8 MM Misc Use to inject insulin daily         Allergies: No Known Allergies  Past Medical History:  Diagnosis Date   Allergy    Arthritis    knees   Diabetes mellitus without complication ([place])    Hyperlipidemia    Hypertension    Personal  history of adenomatous colonic polyps 01/19/2011   Sleep apnea    not using CPAP   Ulcer    30 years ago    Past Surgical History:  Procedure Laterality Date   COLONOSCOPY  12/14/2011   Procedure: COLONOSCOPY;  Surgeon: Gatha Mayer, MD;  Location: [facility];  Service: Endoscopy;  Laterality: N/A;  colon wiht APC for follow up of colon polyps   COLONOSCOPY N/A 10/10/2012   Procedure: COLONOSCOPY;  Surgeon: Gatha Mayer, MD;  Location: [facility];  Service: Endoscopy;  Laterality: N/A;  Need APC   COLONOSCOPY     COLONOSCOPY W/ POLYPECTOMY  01/13/11   3 polyps, largest 3 cm cecal polyp - adenomas   HOT HEMOSTASIS  12/14/2011   Procedure: HOT HEMOSTASIS (ARGON PLASMA COAGULATION/BICAP);  Surgeon: Gatha Mayer, MD;  Location: [facility];  Service: Endoscopy;  Laterality: N/A;   VASECTOMY     with sedation     Family History  Problem Relation Age of Onset   Colon cancer Mother 34   Heart disease Mother    Heart disease Father    Cancer - Other Daughter        glioblastoma- died age 70    Obesity Neg Hx    Sleep apnea Neg Hx    Stroke Neg Hx    Heart attack Neg Hx    Colon polyps Neg Hx    Rectal cancer Neg Hx    Stomach cancer Neg Hx    Esophageal cancer Neg Hx     Social History:  reports that he has never smoked. He has never used smokeless tobacco. He reports current alcohol use. He reports that he does not use drugs.    Review of Systems       Lipids: Has been on Lipitor for over 10 years for hypercholesterolemia,  followed by PCP. Triglycerides had been controlled with fenofibrate  LDL is controlled; his HDL has been consistently low    Lab Results  Component Value Date   CHOL 114 08/07/2020   HDL 25.50 (L) 08/07/2020   LDLCALC 61 08/07/2020    TRIG 136.0 08/07/2020   CHOLHDL 4 08/07/2020           Has history of sleep apnea   HYPERTENSION: He is on losartan 100 mg and also amlodipine followed by PCP  He has not been checking his blood pressure at home even though he has an instrument   BP Readings from Last 3 Encounters:  07/07/21 (!) 144/70  05/05/21 128/78  03/24/21 138/82     Last foot exam done with PCP  TSH 2.01 in 6/21  Physical Examination:  BP (!) 144/70 (BP Location: Left Arm, Patient Position: Sitting, Cuff Size: Large)   Pulse 74   Ht 6' (1.829 m)   Wt (!) 306 lb 12.8 oz (139.2 kg)   SpO2 96%   BMI 41.61 kg/m    ASSESSMENT:  Diabetes type 2, With obesity    See history of present illness for discussion of current diabetes management, blood sugar patterns and problems identified  His A1c is last 7.8 Fructosamine is now relatively better at 253  Previous best A1c was 6.2  His fasting readings were significantly better with using 14 units of Lantus along with 20 to 50 mg of metformin However recently has by mistake forgotten the third tablet of metformin as well as has not done well with his exercise and likely diet Not clear if his postprandial readings are high,  has only sporadic readings which appear to be fairly good except after breakfast   HYPERTENSION: Fair control, systolic relatively high today checked twice Microalbumin normal   PLAN:  For diabetes: He will need to increase his Lantus slightly as 2 units for now Go back to 3 tablets of metformin ER 750 mg daily He will need to start walking or going to the gym for his elliptical exercise  Again advised him to change his GLIMEPIRIDE to 2 mg twice daily before meals instead of all in the evening  Call if postprandial readings consistently high He will need to check his insurance coverage for [place] Glade next year  For hypertension Restart checking blood pressure periodically at home and consider increasing amlodipine if  needed  Total visit time including counseling = 30 minutes   Patient Instructions  Lantus 16 units for now  2 Metformin in pm  Glimeperide 38m 2x daily  Check blood sugars on waking up 5 days a week  Also check blood sugars about 2 hours after meals and do this after different meals by rotation  Recommended blood sugar levels on waking up are 90-130 and about 2 hours after meal is 130-160  Please bring your blood sugar monitor to each visit, thank you  Walk daily   AElayne Snare12/01/2021, 3:08 PM

## 2021-07-07 NOTE — Patient Instructions (Addendum)
Lantus 16 units for now  2 Metformin in pm  Glimeperide 2mg  2x daily  Check blood sugars on waking up 5 days a week  Also check blood sugars about 2 hours after meals and do this after different meals by rotation  Recommended blood sugar levels on waking up are 90-130 and about 2 hours after meal is 130-160  Please bring your blood sugar monitor to each visit, thank you  Walk daily

## 2021-09-06 ENCOUNTER — Other Ambulatory Visit (INDEPENDENT_AMBULATORY_CARE_PROVIDER_SITE_OTHER): Payer: Medicare Other

## 2021-09-06 ENCOUNTER — Other Ambulatory Visit: Payer: Self-pay

## 2021-09-06 DIAGNOSIS — Z794 Long term (current) use of insulin: Secondary | ICD-10-CM

## 2021-09-06 DIAGNOSIS — E782 Mixed hyperlipidemia: Secondary | ICD-10-CM | POA: Diagnosis not present

## 2021-09-06 DIAGNOSIS — E1165 Type 2 diabetes mellitus with hyperglycemia: Secondary | ICD-10-CM | POA: Diagnosis not present

## 2021-09-06 LAB — COMPREHENSIVE METABOLIC PANEL
ALT: 29 U/L (ref 0–53)
AST: 14 U/L (ref 0–37)
Albumin: 4.1 g/dL (ref 3.5–5.2)
Alkaline Phosphatase: 78 U/L (ref 39–117)
BUN: 15 mg/dL (ref 6–23)
CO2: 26 mEq/L (ref 19–32)
Calcium: 9.2 mg/dL (ref 8.4–10.5)
Chloride: 106 mEq/L (ref 96–112)
Creatinine, Ser: 1.02 mg/dL (ref 0.40–1.50)
GFR: 76.8 mL/min (ref >60.00)
Glucose, Bld: 204 mg/dL — ABNORMAL HIGH (ref 70–99)
Potassium: 4.1 mEq/L (ref 3.5–5.1)
Sodium: 141 mEq/L (ref 135–145)
Total Bilirubin: 0.6 mg/dL (ref 0.2–1.2)
Total Protein: 6.4 g/dL (ref 6.0–8.3)

## 2021-09-06 LAB — LIPID PANEL
Cholesterol: 116 mg/dL (ref 0–200)
HDL: 26.3 mg/dL — ABNORMAL LOW (ref >39.00)
NonHDL: 89.79
Total CHOL/HDL Ratio: 4
Triglycerides: 204 mg/dL — ABNORMAL HIGH (ref 0.0–149.0)
VLDL: 40.8 mg/dL — ABNORMAL HIGH (ref 0.0–40.0)

## 2021-09-06 LAB — HEMOGLOBIN A1C: Hgb A1c MFr Bld: 7.8 % — ABNORMAL HIGH (ref 4.6–6.5)

## 2021-09-06 LAB — LDL CHOLESTEROL, DIRECT: Direct LDL: 72 mg/dL

## 2021-09-08 ENCOUNTER — Ambulatory Visit (INDEPENDENT_AMBULATORY_CARE_PROVIDER_SITE_OTHER): Payer: Medicare Other | Admitting: Endocrinology

## 2021-09-08 ENCOUNTER — Other Ambulatory Visit: Payer: Self-pay

## 2021-09-08 ENCOUNTER — Encounter: Payer: Self-pay | Admitting: Endocrinology

## 2021-09-08 VITALS — BP 128/74 | HR 72 | Ht 72.0 in | Wt 305.8 lb

## 2021-09-08 DIAGNOSIS — E1165 Type 2 diabetes mellitus with hyperglycemia: Secondary | ICD-10-CM | POA: Diagnosis not present

## 2021-09-08 DIAGNOSIS — Z794 Long term (current) use of insulin: Secondary | ICD-10-CM | POA: Diagnosis not present

## 2021-09-08 DIAGNOSIS — I10 Essential (primary) hypertension: Secondary | ICD-10-CM | POA: Diagnosis not present

## 2021-09-08 MED ORDER — DAPAGLIFLOZIN PROPANEDIOL 10 MG PO TABS: 10.0000 mg | ORAL_TABLET | Freq: Every day | ORAL | 3 refills | Status: DC

## 2021-09-08 NOTE — Patient Instructions (Addendum)
Take Glimeperide 2x daily  Check blood sugars on waking up 4 days a week  Also check blood sugars about 2 hours after meals and do this after different meals by rotation  Recommended blood sugar levels on waking up are 90-130 and about 2 hours after meal is 130-160  Please bring your blood sugar monitor to each visit, thank you  [place] Glade 1/2 daily  No amlodipine with Wilder Glade

## 2021-09-08 NOTE — Progress Notes (Signed)
Patient ID: Mark White, male   DOB: June 05, 1956, 66 y.o.   MRN: [id number]      Reason for Appointment : Followup for Type 2 Diabetes  History of Present Illness          Diagnosis: Type 2 diabetes mellitus, date of diagnosis:   2013        Past history:  At diagnosis he was having borderline hyperglycemia. He had previously been taking metformin from his PCP. He was prescribed this twice a day but would tend to forget and take it once a day.  His A1c in early 2014 was 7.2   Because of his relatively high A1c of 7.5 and obesity he was started on Trulicity 2.53 mg weekly in 06/2013 Previously in 09/2014 he had made substantial changes in his diet and lost over 20 pounds His A1c with this was also much better than usual at 5.8  Recent history:   Non-insulin hypoglycemic drugs: Metformin ER 750 mg, 2-3 tablets daily, Amaryl 4 mg daily  INSULIN regimen: Lantus 25 units at night  His A1c is the same at 7.8  Fructosamine previously 253 compared to previous 275  Current control, blood sugar patterns and problems identified: He was reminded to check his blood sugars after meals but he is doing this only rarely  Also he was told to take 3 metformin tablets but he forgets this in the morning and takes 2 in the evening mostly  He was also told to take AMARYL 2 mg twice daily but he is still taking both tablets at bedtime despite instructions  Since his morning sugars had been consistently high he has progressively gone up on the dose of the LANTUS, previously taking only 14 units Even with increasing insulin his fasting blood sugars are only occasionally below the target of 130 His weight is about the same He has been not been motivated to exercise He says that he does not like to walk in the cold weather He is unclear if [place] will be covered by his insurance now, previously was costing him $400 or more per month   Side effects from medications have been: Fatigue and  nausea from Trulicity  Glucose monitoring:       Glucometer:    Accu-Chek  Blood Glucose readings from meter review:   PRE-MEAL Fasting Lunch Dinner Bedtime Overall  Glucose range: 119-169   161, 232   Mean/median:     157   PREVIOUSLY:  PRE-MEAL Fasting Lunch Dinner Bedtime Overall  Glucose range: 116-182    108-2 221  Mean/median: 141    145 +/-24   POST-MEAL PC Breakfast PC Lunch PC Dinner  Glucose range: 221    Mean/median:  133 177    Self-care: The diet that the patient has been following is: No specific diet Diet history:  Typical breakfast is an egg,  with bread        Dietician visit: Most recent: 5/14, does not want to pay for this again.      Weight history: Usually ranging from 290-305  Wt Readings from Last 3 Encounters:  09/08/21 (!) 305 lb 12.8 oz (138.7 kg)  07/07/21 (!) 306 lb 12.8 oz (139.2 kg)  05/05/21 (!) 304 lb (137.9 kg)    Diabetes control:    Lab Results  Component Value Date   HGBA1C 7.8 (H) 09/06/2021   HGBA1C 7.8 (H) 05/03/2021   HGBA1C 7.7 (A) 02/10/2021  Lab Results  Component Value Date   MICROALBUR 0.7 07/05/2021   LDLCALC 61 08/07/2020   CREATININE 1.02 09/06/2021   Lab Results  Component Value Date   FRUCTOSAMINE 253 07/05/2021   FRUCTOSAMINE 275 03/22/2021   FRUCTOSAMINE 278 02/08/2021     Lab on 09/06/2021  Component Date Value Ref Range Status   Cholesterol 09/06/2021 116  0 - 200 mg/dL Final   ATP III Classification       Desirable:  < 200 mg/dL               Borderline High:  200 - 239 mg/dL          High:  > = 240 mg/dL   Triglycerides 09/06/2021 204.0 (H)  0.0 - 149.0 mg/dL Final   Normal:  <150 mg/dLBorderline High:  150 - 199 mg/dL   HDL 09/06/2021 26.30 (L)  >39.00 mg/dL Final   VLDL 09/06/2021 40.8 (H)  0.0 - 40.0 mg/dL Final   Total CHOL/HDL Ratio 09/06/2021 4   Final                  Men          Women1/2 Average Risk     3.4          3.3Average Risk          5.0          4.42X Average Risk           9.6          7.13X Average Risk          15.0          11.0                       NonHDL 09/06/2021 89.79   Final   NOTE:  Non-HDL goal should be 30 mg/dL higher than patient's LDL goal (i.e. LDL goal of < 70 mg/dL, would have non-HDL goal of < 100 mg/dL)   Sodium 09/06/2021 141  135 - 145 mEq/L Final   Potassium 09/06/2021 4.1  3.5 - 5.1 mEq/L Final   Chloride 09/06/2021 106  96 - 112 mEq/L Final   CO2 09/06/2021 26  19 - 32 mEq/L Final   Glucose, Bld 09/06/2021 204 (H)  70 - 99 mg/dL Final   BUN 09/06/2021 15  6 - 23 mg/dL Final   Creatinine, Ser 09/06/2021 1.02  0.40 - 1.50 mg/dL Final   Total Bilirubin 09/06/2021 0.6  0.2 - 1.2 mg/dL Final   Alkaline Phosphatase 09/06/2021 78  39 - 117 U/L Final   AST 09/06/2021 14  0 - 37 U/L Final   ALT 09/06/2021 29  0 - 53 U/L Final   Total Protein 09/06/2021 6.4  6.0 - 8.3 g/dL Final   Albumin 09/06/2021 4.1  3.5 - 5.2 g/dL Final   GFR 09/06/2021 76.80  >60.00 mL/min Final   Calculated using the CKD-EPI Creatinine Equation (2021)   Calcium 09/06/2021 9.2  8.4 - 10.5 mg/dL Final   Hgb A1c MFr Bld 09/06/2021 7.8 (H)  4.6 - 6.5 % Final   Glycemic Control Guidelines for People with Diabetes:Non Diabetic:  <6%Goal of Therapy: <7%Additional Action Suggested:  >8%    Direct LDL 09/06/2021 72.0  mg/dL Final   Optimal:  <100 mg/dLNear or Above Optimal:  100-129 mg/dLBorderline High:  130-159 mg/dLHigh:  160-189 mg/dLVery High:  >190 mg/dL    Allergies as of 09/08/2021  No Known Allergies      Medication List        Accurate as of September 08, 2021  4:44 PM. If you have any questions, ask your nurse or doctor.          Accu-Chek Guide test strip Generic drug: glucose blood CHECK SUGAR 3 TIMES DAILY   Accu-Chek Guide w/Device Kit 1 each by Does not apply route daily. Use glucose meter to check blood sugar as prescribed.   amLODipine 2.5 MG tablet Commonly known as: NORVASC Take by mouth.   aspirin 81 MG tablet Take 1 tablet (81 mg  total) by mouth daily. STOP TAKING AND RESTART Dec 29 2011 What changed: additional instructions   atorvastatin 40 MG tablet Commonly known as: LIPITOR Take 40 mg by mouth daily.   dapagliflozin propanediol 10 MG Tabs tablet Commonly known as: Farxiga Take 1 tablet (10 mg total) by mouth daily. Started by: Elayne Snare, MD   glimepiride 2 MG tablet Commonly known as: AMARYL TAKE 2 TABLETS (4MG TOTAL) BY MOUTH DAILY AFTER SUPPER.   insulin glargine 100 UNIT/ML Solostar Pen Commonly known as: LANTUS Inject up to 15 units every morning   losartan 100 MG tablet Commonly known as: COZAAR Take by mouth.   metFORMIN 750 MG 24 hr tablet Commonly known as: GLUCOPHAGE-XR 1 tablet at breakfast and 2 tablets at dinner   OneTouch Delica Plus [id number] Misc USE TO CHECK BLOOD SUGAR ONCE A DAY DX CODE E11.65   Accu-Chek Softclix Lancets lancets CHECK SUGARS 3 TIMES DAILY E11.65   Pen Needles 31G X 8 MM Misc Use to inject insulin daily        Allergies: No Known Allergies  Past Medical History:  Diagnosis Date   Allergy    Arthritis    knees   Diabetes mellitus without complication ([place])    Hyperlipidemia    Hypertension    Personal history of adenomatous colonic polyps 01/19/2011   Sleep apnea    not using CPAP   Ulcer    30 years ago    Past Surgical History:  Procedure Laterality Date   COLONOSCOPY  12/14/2011   Procedure: COLONOSCOPY;  Surgeon: Gatha Mayer, MD;  Location: [facility];  Service: Endoscopy;  Laterality: N/A;  colon wiht APC for follow up of colon polyps   COLONOSCOPY N/A 10/10/2012   Procedure: COLONOSCOPY;  Surgeon: Gatha Mayer, MD;  Location: [facility];  Service: Endoscopy;  Laterality: N/A;  Need APC   COLONOSCOPY     COLONOSCOPY W/ POLYPECTOMY  01/13/11   3 polyps, largest 3 cm cecal polyp - adenomas   HOT HEMOSTASIS  12/14/2011   Procedure: HOT HEMOSTASIS (ARGON PLASMA COAGULATION/BICAP);  Surgeon: Gatha Mayer, MD;  Location: [facility];  Service: Endoscopy;  Laterality: N/A;   VASECTOMY     with sedation     Family History  Problem Relation Age of Onset   Colon cancer Mother 72   Heart disease Mother    Heart disease Father    Cancer - Other Daughter        glioblastoma- died age 60    Obesity Neg Hx    Sleep apnea Neg Hx    Stroke Neg Hx    Heart attack Neg Hx    Colon polyps Neg Hx    Rectal cancer Neg Hx    Stomach cancer Neg Hx    Esophageal cancer Neg Hx     Social History:  reports that he  has never smoked. He has never used smokeless tobacco. He reports current alcohol use. He reports that he does not use drugs.    Review of Systems       Lipids: Has been on Lipitor for over 10 years for hypercholesterolemia,  followed by PCP. Triglycerides had been controlled with fenofibrate Labs from this visit were nonfasting  LDL is controlled; his HDL has been consistently low    Lab Results  Component Value Date   CHOL 116 09/06/2021   HDL 26.30 (L) 09/06/2021   LDLCALC 61 08/07/2020   LDLDIRECT 72.0 09/06/2021   TRIG 204.0 (H) 09/06/2021   CHOLHDL 4 09/06/2021           Has history of sleep apnea   HYPERTENSION: He is on losartan 100 mg and also amlodipine followed by PCP  Blood pressure was better on the second measurement  BP Readings from Last 3 Encounters:  09/08/21 128/74  07/07/21 (!) 144/70  05/05/21 128/78     Last foot exam done with PCP  TSH 2.01 in 6/21  Physical Examination:  BP 128/74 (Patient Position: Standing, Cuff Size: Large)    Pulse 72    Ht 6' (1.829 m)    Wt (!) 305 lb 12.8 oz (138.7 kg)    SpO2 96%    BMI 41.47 kg/m    ASSESSMENT:  Diabetes type 2, With obesity    See history of present illness for discussion of current diabetes management, blood sugar patterns and problems identified  His A1c is again 7.8  He is on insulin with 25 units Lantus daily along with metformin and Amaryl  Previous best A1c was 6.2  His fasting readings were  significantly better with using 14 units of Lantus along with 20 to 50 mg of metformin However recently has by mistake forgotten the third tablet of metformin as well as has not done well with his exercise and likely diet Not clear if his postprandial readings are high, has only sporadic readings which appear to be fairly good except after breakfast   HYPERTENSION: Blood pressure appears better today although appears to have some whitecoat syndrome    PLAN:  For diabetes: He will need to try [place] and see if this is covered Again reminded him of action of SGLT 2 drugs on lowering glucose by decreasing kidney absorption of glucose, benefits of weight loss and lower blood pressure, possible side effects including change in blood pressure and hydration Since blood pressure is relatively good he will stop his AMLODIPINE when starting Farxiga Reduce Lantus if morning sugars start getting below 90  He will make sure he takes 3 tablets of metformin ER 750 mg daily He will need to start walking or going to the gym for his elliptical exercise  Again reminded him to change his GLIMEPIRIDE to 2 mg twice daily before meals instead of all in the evening  Start checking postprandial readings 2 hours after either lunch or dinner and call if consistently high Discussed he may need mealtime insulin if he is not able to get [place] or blood sugars continue to be high  Consultation with the dietitian ordered  For hypertension Needs to be checking blood pressure periodically at home and consider increasing amlodipine if needed  Total visit time including counseling = 30 minutes   Patient Instructions  Take Glimeperide 2x daily  Check blood sugars on waking up 4 days a week  Also check blood sugars about 2 hours after meals and do  this after different meals by rotation  Recommended blood sugar levels on waking up are 90-130 and about 2 hours after meal is 130-160  Please bring your blood sugar  monitor to each visit, thank you  [place]ade 1/2 daily  No amlodipine with [facility] 09/08/2021, 4:44 PM

## 2021-09-09 ENCOUNTER — Other Ambulatory Visit: Payer: Self-pay | Admitting: Endocrinology

## 2021-09-09 DIAGNOSIS — Z794 Long term (current) use of insulin: Secondary | ICD-10-CM

## 2021-09-13 ENCOUNTER — Other Ambulatory Visit: Payer: Self-pay | Admitting: Endocrinology

## 2021-09-14 ENCOUNTER — Other Ambulatory Visit (HOSPITAL_COMMUNITY): Payer: Self-pay

## 2021-09-14 ENCOUNTER — Telehealth: Payer: Self-pay | Admitting: Pharmacy Technician

## 2021-09-14 ENCOUNTER — Telehealth: Payer: Self-pay

## 2021-09-14 NOTE — Telephone Encounter (Signed)
PA has been submitted and is approved. #270/90 day supply returns a copay of $57, and #90/30 days returns $19 co-pay

## 2021-09-14 NOTE — Telephone Encounter (Signed)
Patient Advocate Encounter  Received notification from [facility]) that prior authorization for PLAN LIMITATION EXCEEDED METFORMIN ER 750MG  is required.   PA submitted on 2.14.23 Key [id number] Status is pending   [facility] will continue to follow  Luciano Cutter, CPhT Patient Advocate [facility] Phone: [phone] Fax:  [phone]

## 2021-09-14 NOTE — Telephone Encounter (Signed)
PA has been submitted. Will continue to follow.

## 2021-09-14 NOTE — Telephone Encounter (Signed)
Pt called and states insurance will not cover metformin for 3 tablets daily. Please advise

## 2021-09-15 NOTE — Telephone Encounter (Signed)
Pharmacy called to confirm that metformin Rx sig is correct. States it exceeds maximum daily dose.

## 2021-09-16 ENCOUNTER — Other Ambulatory Visit (HOSPITAL_COMMUNITY): Payer: Self-pay

## 2021-09-16 NOTE — Telephone Encounter (Signed)
Patient Advocate Encounter  Prior Authorization for Metformin 750mg  tabs has been approved.    PA# [id number]  Effective dates: 08/01/21 through 07/31/22  Refill too soon.  Spoke with Pharmacy to Process.  Patient Advocate Fax: [phone]

## 2021-10-27 LAB — HM DIABETES FOOT EXAM: HM Diabetic Foot Exam: NORMAL

## 2021-10-29 ENCOUNTER — Other Ambulatory Visit: Payer: Self-pay | Admitting: Endocrinology

## 2021-10-29 DIAGNOSIS — E1165 Type 2 diabetes mellitus with hyperglycemia: Secondary | ICD-10-CM

## 2021-11-11 ENCOUNTER — Other Ambulatory Visit: Payer: Self-pay | Admitting: Endocrinology

## 2021-11-11 DIAGNOSIS — E1165 Type 2 diabetes mellitus with hyperglycemia: Secondary | ICD-10-CM

## 2021-11-16 ENCOUNTER — Other Ambulatory Visit (INDEPENDENT_AMBULATORY_CARE_PROVIDER_SITE_OTHER): Payer: Medicare Other

## 2021-11-16 ENCOUNTER — Ambulatory Visit: Payer: Medicare Other | Admitting: Endocrinology

## 2021-11-16 DIAGNOSIS — Z794 Long term (current) use of insulin: Secondary | ICD-10-CM

## 2021-11-16 DIAGNOSIS — E1165 Type 2 diabetes mellitus with hyperglycemia: Secondary | ICD-10-CM | POA: Diagnosis not present

## 2021-11-16 LAB — BASIC METABOLIC PANEL
BUN: 14 mg/dL (ref 6–23)
CO2: 28 mEq/L (ref 19–32)
Calcium: 9.5 mg/dL (ref 8.4–10.5)
Chloride: 105 mEq/L (ref 96–112)
Creatinine, Ser: 0.97 mg/dL (ref 0.40–1.50)
GFR: 81.46 mL/min (ref >60.00)
Glucose, Bld: 130 mg/dL — ABNORMAL HIGH (ref 70–99)
Potassium: 4.6 mEq/L (ref 3.5–5.1)
Sodium: 141 mEq/L (ref 135–145)

## 2021-11-17 LAB — FRUCTOSAMINE: Fructosamine: 236 umol/L (ref 0–285)

## 2021-11-18 ENCOUNTER — Encounter: Payer: Self-pay | Admitting: Endocrinology

## 2021-11-18 ENCOUNTER — Ambulatory Visit (INDEPENDENT_AMBULATORY_CARE_PROVIDER_SITE_OTHER): Payer: Medicare Other | Admitting: Endocrinology

## 2021-11-18 VITALS — BP 148/68 | HR 74 | Ht 72.0 in | Wt 295.2 lb

## 2021-11-18 DIAGNOSIS — E1165 Type 2 diabetes mellitus with hyperglycemia: Secondary | ICD-10-CM | POA: Diagnosis not present

## 2021-11-18 NOTE — Progress Notes (Signed)
? ? ?Patient ID: Mark White, male   DOB: 1956/02/28, 66 y.o.   MRN: [id number] ? ? ? ? ? ?Reason for Appointment : Followup for Type 2 Diabetes ? ?History of Present Illness  ?        ?Diagnosis: Type 2 diabetes mellitus, date of diagnosis:   2013       ? ?Past history:  At diagnosis he was having borderline hyperglycemia. ?He had previously been taking metformin from his PCP. He was prescribed this twice a day but would tend to forget and take it once a day.  ?His A1c in early 2014 was 7.2   ?Because of his relatively high A1c of 7.5 and obesity he was started on Trulicity 0.25 mg weekly in 06/2013 ?Previously in 09/2014 he had made substantial changes in his diet and lost over 20 pounds ?His A1c with this was also much better than usual at 5.8 ? ?Recent history:  ? ?Non-insulin hypoglycemic drugs: Metformin ER 750 mg, 2 tablets daily, Amaryl 4 mg at dinner ? ?INSULIN regimen: Lantus 15  units at night ? ?His A1c is last 7.8 ? ?Fructosamine is 236, previously 253 ? ?Current control, blood sugar patterns and problems identified: ? ?Although he has been recommended trying to add Wilder Glade to his regimen he still could not do so because of cost  ?Also he was told to see the dietitian and he again refused because he thought it was not being covered by insurance ?He was told to take 3 metformin tablets but he forgets this in the morning and takes 2 in the evening only ?Again he does not take any Amaryl in the morning as he forgets to take morning medications and is taking 4 mg at night ?Since he was advised to improve his diet he says he has been much more motivated ?He has cut back on snacks significantly and also trying to watch his portions better ?With this he has lost 10 pounds ?Also his blood sugars are significantly better especially in the morning ?Only a couple of times he has had high readings from going off his diet with eating sweets ?He still does not understand how to adjust his basal insulin and on  his own has cut it down by 10 units about a month ago ?However most blood sugars in the mornings are in high normal range without hypoglycemia ?He has not done much walking but is planning to start ? ? ?Side effects from medications have been: Fatigue and nausea from Trulicity ? ?Glucose monitoring:       Glucometer:    Accu-Chek ? ?Blood Glucose readings from meter review: ? ? ?PRE-MEAL Fasting Lunch Dinner Bedtime Overall  ?Glucose range: 93-129   143-251   ?Mean/median:     140  ? ? ?Previously: ? ?PRE-MEAL Fasting Lunch Dinner Bedtime Overall  ?Glucose range: 119-169   161, 232   ?Mean/median:     157  ? ? ? ?Self-care: The diet that the patient has been following is: No specific diet ?Diet history:  Typical breakfast is an egg,  with bread  ? ?     ?Dietician visit: Most recent: 5/14, does not want to pay for this again.     ? ?Weight history: Usually ranging from 290-305 ? ?Wt Readings from Last 3 Encounters:  ?11/18/21 295 lb 3.2 oz (133.9 kg)  ?09/08/21 (!) 305 lb 12.8 oz (138.7 kg)  ?07/07/21 (!) 306 lb 12.8 oz (139.2 kg)  ? ? ?Diabetes control:  ? ? ?  Lab Results  ?Component Value Date  ? HGBA1C 7.8 (H) 09/06/2021  ? HGBA1C 7.8 (H) 05/03/2021  ? HGBA1C 7.7 (A) 02/10/2021  ? ?Lab Results  ?Component Value Date  ? MICROALBUR 0.7 07/05/2021  ? [place] 61 08/07/2020  ? CREATININE 0.97 11/16/2021  ? ?Lab Results  ?Component Value Date  ? FRUCTOSAMINE 236 11/16/2021  ? FRUCTOSAMINE 253 07/05/2021  ? FRUCTOSAMINE 275 03/22/2021  ? ? ? ?Lab on 11/16/2021  ?Component Date Value Ref Range Status  ? Sodium 11/16/2021 141  135 - 145 mEq/L Final  ? Potassium 11/16/2021 4.6  3.5 - 5.1 mEq/L Final  ? Chloride 11/16/2021 105  96 - 112 mEq/L Final  ? CO2 11/16/2021 28  19 - 32 mEq/L Final  ? Glucose, Bld 11/16/2021 130 (H)  70 - 99 mg/dL Final  ? BUN 11/16/2021 14  6 - 23 mg/dL Final  ? Creatinine, Ser 11/16/2021 0.97  0.40 - 1.50 mg/dL Final  ? GFR 11/16/2021 81.46  >60.00 mL/min Final  ? Calculated using the CKD-EPI  Creatinine Equation (2021)  ? Calcium 11/16/2021 9.5  8.4 - 10.5 mg/dL Final  ? Fructosamine 11/16/2021 236  0 - 285 umol/L Final  ? Comment: Published reference interval for apparently healthy subjects ?between age 72 and 60 is 60 - 285 umol/L and in a poorly ?controlled diabetic population is 228 - 563 umol/L with a ?mean of 396 umol/L. ?  ? ? ?Allergies as of 11/18/2021   ?No Known Allergies ?  ? ?  ?Medication List  ?  ? ?  ? Accurate as of November 18, 2021  9:58 AM. If you have any questions, ask your nurse or doctor.  ?  ?  ? ?  ? ?Accu-Chek Guide test strip ?Generic drug: glucose blood ?CHECK SUGAR 3 TIMES A DAY ?  ?Accu-Chek Guide w/Device Kit ?1 each by Does not apply route daily. Use glucose meter to check blood sugar as prescribed. ?  ?amLODipine 2.5 MG tablet ?Commonly known as: NORVASC ?Take by mouth. ?  ?aspirin 81 MG tablet ?Take 1 tablet (81 mg total) by mouth daily. STOP TAKING AND RESTART Dec 29 2011 ?What changed: additional instructions ?  ?atorvastatin 40 MG tablet ?Commonly known as: LIPITOR ?Take 40 mg by mouth daily. ?  ?dapagliflozin propanediol 10 MG Tabs tablet ?Commonly known as: [place] ?Take 1 tablet (10 mg total) by mouth daily. ?  ?glimepiride 2 MG tablet ?Commonly known as: AMARYL ?TAKE 2 TABLETS (4MG TOTAL) BY MOUTH DAILY AFTER SUPPER. ?  ?Lantus SoloStar 100 UNIT/ML Solostar Pen ?Generic drug: insulin glargine ?INJECT UP TO 15 UNITS EVERY MORNING ?  ?losartan 100 MG tablet ?Commonly known as: COZAAR ?Take by mouth. ?  ?metFORMIN 750 MG 24 hr tablet ?Commonly known as: GLUCOPHAGE-XR ?TAKE 1 TABLET AT BREAKFAST AND 2 TABLETS AT DINNER ?  ?OneTouch Delica Plus [id number] Misc ?USE TO CHECK BLOOD SUGAR ONCE A DAY DX CODE E11.65 ?  ?Accu-Chek Softclix Lancets lancets ?CHECK SUGARS 3 TIMES DAILY E11.65 ?  ?Pen Needles 31G X 8 MM Misc ?Use to inject insulin daily ?  ? ?  ? ? ?Allergies: No Known Allergies ? ?Past Medical History:  ?Diagnosis Date  ? Allergy   ? Arthritis   ? knees  ?  Diabetes mellitus without complication ([place])   ? Hyperlipidemia   ? Hypertension   ? Personal history of adenomatous colonic polyps 01/19/2011  ? Sleep apnea   ? not using CPAP  ? Ulcer   ?  30 years ago  ? ? ?Past Surgical History:  ?Procedure Laterality Date  ? COLONOSCOPY  12/14/2011  ? Procedure: COLONOSCOPY;  Surgeon: Gatha Mayer, MD;  Location: [facility];  Service: Endoscopy;  Laterality: N/A;  colon wiht APC for follow up of colon polyps  ? COLONOSCOPY N/A 10/10/2012  ? Procedure: COLONOSCOPY;  Surgeon: Gatha Mayer, MD;  Location: [facility];  Service: Endoscopy;  Laterality: N/A;  Need APC  ? COLONOSCOPY    ? COLONOSCOPY W/ POLYPECTOMY  01/13/11  ? 3 polyps, largest 3 cm cecal polyp - adenomas  ? HOT HEMOSTASIS  12/14/2011  ? Procedure: HOT HEMOSTASIS (ARGON PLASMA COAGULATION/BICAP);  Surgeon: Gatha Mayer, MD;  Location: [facility];  Service: Endoscopy;  Laterality: N/A;  ? VASECTOMY    ? with sedation   ? ? ?Family History  ?Problem Relation Age of Onset  ? Colon cancer Mother 14  ? Heart disease Mother   ? Heart disease Father   ? Cancer - Other Daughter   ?     glioblastoma- died age 65   ? Obesity Neg Hx   ? Sleep apnea Neg Hx   ? Stroke Neg Hx   ? Heart attack Neg Hx   ? Colon polyps Neg Hx   ? Rectal cancer Neg Hx   ? Stomach cancer Neg Hx   ? Esophageal cancer Neg Hx   ? ? ?Social History:  reports that he has never smoked. He has never used smokeless tobacco. He reports current alcohol use. He reports that he does not use drugs. ? ?  ?Review of Systems  ? ?    Lipids: Has been on Lipitor for over 10 years for hypercholesterolemia,  followed by PCP. ?Triglycerides had been controlled with fenofibrate ?Labs from this visit were nonfasting ? ?LDL is controlled; his HDL has been consistently low ?  ? ?Lab Results  ?Component Value Date  ? CHOL 116 09/06/2021  ? HDL 26.30 (L) 09/06/2021  ? [place] 61 08/07/2020  ? LDLDIRECT 72.0 09/06/2021  ? TRIG 204.0 (H) 09/06/2021  ? CHOLHDL 4 09/06/2021   ? ?        ?Has history of sleep apnea ? ? ?HYPERTENSION: He is on losartan 100 mg and also amlodipine followed by PCP ? ?Blood pressure was better on the second measurement ? ?BP Readings from Last 3 Encounters:

## 2021-11-18 NOTE — Patient Instructions (Addendum)
Check blood sugars on waking up 4 days a week ? ?Also check blood sugars about 2 hours after meals and do this after different meals by rotation ? ?Recommended blood sugar levels on waking up are 90-130 and about 2 hours after meal is 130-180 ? ?Please bring your blood sugar monitor to each visit, thank you ? ?Reduce lantus if am stays <90 ? ?Take 3 Metformin ? ?Check BP weekly ?

## 2022-02-10 ENCOUNTER — Other Ambulatory Visit (INDEPENDENT_AMBULATORY_CARE_PROVIDER_SITE_OTHER): Payer: Medicare Other

## 2022-02-10 DIAGNOSIS — E1165 Type 2 diabetes mellitus with hyperglycemia: Secondary | ICD-10-CM

## 2022-02-10 LAB — BASIC METABOLIC PANEL
BUN: 15 mg/dL (ref 6–23)
CO2: 28 mEq/L (ref 19–32)
Calcium: 9.6 mg/dL (ref 8.4–10.5)
Chloride: 104 mEq/L (ref 96–112)
Creatinine, Ser: 1.04 mg/dL (ref 0.40–1.50)
GFR: 74.81 mL/min (ref >60.00)
Glucose, Bld: 157 mg/dL — ABNORMAL HIGH (ref 70–99)
Potassium: 4.4 mEq/L (ref 3.5–5.1)
Sodium: 139 mEq/L (ref 135–145)

## 2022-02-10 LAB — HEMOGLOBIN A1C: Hgb A1c MFr Bld: 7.5 % — ABNORMAL HIGH (ref 4.6–6.5)

## 2022-02-11 ENCOUNTER — Other Ambulatory Visit: Payer: Medicare Other

## 2022-02-16 ENCOUNTER — Ambulatory Visit (INDEPENDENT_AMBULATORY_CARE_PROVIDER_SITE_OTHER): Payer: Medicare Other | Admitting: Endocrinology

## 2022-02-16 ENCOUNTER — Encounter: Payer: Self-pay | Admitting: Endocrinology

## 2022-02-16 VITALS — BP 130/70 | HR 66 | Ht 72.0 in | Wt 294.2 lb

## 2022-02-16 DIAGNOSIS — E782 Mixed hyperlipidemia: Secondary | ICD-10-CM

## 2022-02-16 DIAGNOSIS — Z794 Long term (current) use of insulin: Secondary | ICD-10-CM

## 2022-02-16 DIAGNOSIS — I10 Essential (primary) hypertension: Secondary | ICD-10-CM | POA: Diagnosis not present

## 2022-02-16 DIAGNOSIS — E1165 Type 2 diabetes mellitus with hyperglycemia: Secondary | ICD-10-CM | POA: Diagnosis not present

## 2022-02-16 NOTE — Progress Notes (Signed)
Patient ID: Mark White, male   DOB: 1955/09/16, 66 y.o.   MRN: [id number]      Reason for Appointment : Followup for Type 2 Diabetes  History of Present Illness          Diagnosis: Type 2 diabetes mellitus, date of diagnosis:   2013        Past history:  At diagnosis he was having borderline hyperglycemia. He had previously been taking metformin from his PCP. He was prescribed this twice a day but would tend to forget and take it once a day.  His A1c in early 2014 was 7.2   Because of his relatively high A1c of 7.5 and obesity he was started on Trulicity 6.96 mg weekly in 06/2013 Previously in 09/2014 he had made substantial changes in his diet and lost over 20 pounds His A1c with this was also much better than usual at 5.8  Recent history:   Non-insulin hypoglycemic drugs: Metformin ER 750 mg, 2 tablets daily, Amaryl 4 mg at dinner  INSULIN regimen: Lantus 15  units at night  His A1c is 7.5, was 7.8  Current control, blood sugar patterns and problems identified:  He is still not checking his blood sugars after meals and only in the mornings His blood sugars are mildly increased now compared to fasting readings before This is despite saying that he is recently starting to exercise His blood sugars may be higher in the morning if he has a snack like cereal at bedtime Also his blood sugars are higher with more carbohydrates like rice  Previously had lost weight but now this has leveled off  Late morning fasting glucose in the lab was 157 but he has not increased his insulin dose  Also he was told to take 3 tablets of metformin a day and he forgets to take the third tablet Again not able to take [place] because of cost  Cereal hs, rice  Side effects from medications have been: Fatigue and nausea from Trulicity  Glucose monitoring:       Glucometer:    Accu-Chek  Blood Glucose readings from meter review:   PRE-MEAL Fasting Lunch Dinner Bedtime Overall   Glucose range: 118-150      Mean/median:        POST-MEAL PC Breakfast PC Lunch PC Dinner  Glucose range:     Mean/median:      Prior   PRE-MEAL Fasting Lunch Dinner Bedtime Overall  Glucose range: 93-129   143-251   Mean/median:     140     Self-care: The diet that the patient has been following is: No specific diet Diet history:  Typical breakfast is an egg,  with bread        Dietician visit: Most recent: 5/14, does not want to pay for this again.      Weight history: Usually ranging from 290-305  Wt Readings from Last 3 Encounters:  02/16/22 294 lb 3.2 oz (133.4 kg)  11/18/21 295 lb 3.2 oz (133.9 kg)  09/08/21 (!) 305 lb 12.8 oz (138.7 kg)    Diabetes control:    Lab Results  Component Value Date   HGBA1C 7.5 (H) 02/10/2022   HGBA1C 7.8 (H) 09/06/2021   HGBA1C 7.8 (H) 05/03/2021   Lab Results  Component Value Date   MICROALBUR 0.7 07/05/2021   LDLCALC 61 08/07/2020   CREATININE 1.04 02/10/2022   Lab Results  Component Value Date   FRUCTOSAMINE 236 11/16/2021   FRUCTOSAMINE  253 07/05/2021   FRUCTOSAMINE 275 03/22/2021     Lab on 02/10/2022  Component Date Value Ref Range Status   Sodium 02/10/2022 139  135 - 145 mEq/L Final   Potassium 02/10/2022 4.4  3.5 - 5.1 mEq/L Final   Chloride 02/10/2022 104  96 - 112 mEq/L Final   CO2 02/10/2022 28  19 - 32 mEq/L Final   Glucose, Bld 02/10/2022 157 (H)  70 - 99 mg/dL Final   BUN 02/10/2022 15  6 - 23 mg/dL Final   Creatinine, Ser 02/10/2022 1.04  0.40 - 1.50 mg/dL Final   GFR 02/10/2022 74.81  >60.00 mL/min Final   Calculated using the CKD-EPI Creatinine Equation (2021)   Calcium 02/10/2022 9.6  8.4 - 10.5 mg/dL Final   Hgb A1c MFr Bld 02/10/2022 7.5 (H)  4.6 - 6.5 % Final   Glycemic Control Guidelines for People with Diabetes:Non Diabetic:  <6%Goal of Therapy: <7%Additional Action Suggested:  >8%     Allergies as of 02/16/2022   No Known Allergies      Medication List        Accurate as of  February 16, 2022  4:14 PM. If you have any questions, ask your nurse or doctor.          STOP taking these medications    dapagliflozin propanediol 10 MG Tabs tablet Commonly known as: [place] Stopped by: Elayne Snare, MD       TAKE these medications    Accu-Chek Guide test strip Generic drug: glucose blood CHECK SUGAR 3 TIMES A DAY   Accu-Chek Guide w/Device Kit 1 each by Does not apply route daily. Use glucose meter to check blood sugar as prescribed.   amLODipine 2.5 MG tablet Commonly known as: NORVASC Take by mouth.   aspirin 81 MG tablet Take 1 tablet (81 mg total) by mouth daily. STOP TAKING AND RESTART Dec 29 2011 What changed: additional instructions   atorvastatin 40 MG tablet Commonly known as: LIPITOR Take 40 mg by mouth daily.   glimepiride 2 MG tablet Commonly known as: AMARYL TAKE 2 TABLETS (4MG TOTAL) BY MOUTH DAILY AFTER SUPPER.   Lantus SoloStar 100 UNIT/ML Solostar Pen Generic drug: insulin glargine INJECT UP TO 15 UNITS EVERY MORNING   losartan 100 MG tablet Commonly known as: COZAAR Take by mouth.   metFORMIN 750 MG 24 hr tablet Commonly known as: GLUCOPHAGE-XR TAKE 1 TABLET AT BREAKFAST AND 2 TABLETS AT DINNER   OneTouch Delica Plus [id number] Misc USE TO CHECK BLOOD SUGAR ONCE A DAY DX CODE E11.65   Accu-Chek Softclix Lancets lancets CHECK SUGARS 3 TIMES DAILY E11.65   Pen Needles 31G X 8 MM Misc Use to inject insulin daily        Allergies: No Known Allergies  Past Medical History:  Diagnosis Date   Allergy    Arthritis    knees   Diabetes mellitus without complication ([place])    Hyperlipidemia    Hypertension    Personal history of adenomatous colonic polyps 01/19/2011   Sleep apnea    not using CPAP   Ulcer    30 years ago    Past Surgical History:  Procedure Laterality Date   COLONOSCOPY  12/14/2011   Procedure: COLONOSCOPY;  Surgeon: Gatha Mayer, MD;  Location: [facility];  Service: Endoscopy;  Laterality:  N/A;  colon wiht APC for follow up of colon polyps   COLONOSCOPY N/A 10/10/2012   Procedure: COLONOSCOPY;  Surgeon: Gatha Mayer, MD;  Location:  [facility];  Service: Endoscopy;  Laterality: N/A;  Need APC   COLONOSCOPY     COLONOSCOPY W/ POLYPECTOMY  01/13/11   3 polyps, largest 3 cm cecal polyp - adenomas   HOT HEMOSTASIS  12/14/2011   Procedure: HOT HEMOSTASIS (ARGON PLASMA COAGULATION/BICAP);  Surgeon: Gatha Mayer, MD;  Location: [facility];  Service: Endoscopy;  Laterality: N/A;   VASECTOMY     with sedation     Family History  Problem Relation Age of Onset   Colon cancer Mother 29   Heart disease Mother    Heart disease Father    Cancer - Other Daughter        glioblastoma- died age 30    Obesity Neg Hx    Sleep apnea Neg Hx    Stroke Neg Hx    Heart attack Neg Hx    Colon polyps Neg Hx    Rectal cancer Neg Hx    Stomach cancer Neg Hx    Esophageal cancer Neg Hx     Social History:  reports that he has never smoked. He has never used smokeless tobacco. He reports current alcohol use. He reports that he does not use drugs.    Review of Systems       Lipids: Has been on Lipitor for over 10 years for hypercholesterolemia,  followed by PCP. Triglycerides had been controlled with fenofibrate Labs from visit were nonfasting  LDL is controlled; his HDL has been consistently low    Lab Results  Component Value Date   CHOL 116 09/06/2021   HDL 26.30 (L) 09/06/2021   LDLCALC 61 08/07/2020   LDLDIRECT 72.0 09/06/2021   TRIG 204.0 (H) 09/06/2021   CHOLHDL 4 09/06/2021           Has history of sleep apnea   HYPERTENSION: He is on losartan 100 mg and also amlodipine followed by PCP  Blood pressure was better on the second measurement  BP Readings from Last 3 Encounters:  02/16/22 130/70  11/18/21 (!) 148/68  09/08/21 128/74     Last foot exam done with PCP  TSH 2.01 in 6/21  Physical Examination:  BP 130/70   Pulse 66   Ht 6' (1.829 m)   Wt 294  lb 3.2 oz (133.4 kg)   SpO2 95%   BMI 39.90 kg/m    ASSESSMENT:  Diabetes type 2, With obesity    See history of present illness for discussion of current diabetes management, blood sugar patterns and problems identified  His A1c is last 7.8 and now 7.5  He is on insulin with 15 units Lantus daily along with metformin and Amaryl  Although he has not been as consistent with diet and exercise as before his blood sugar levels appear to be generally improving and he has maintained his weight  HYPERTENSION: Blood pressure is high but back to normal on second measurement as before    PLAN:  For diabetes:  Increase metformin to 3 tablets If blood sugars are still over 130 in the morning we will increase the insulin up to 18 Regular exercise Consistently controlling carbohydrates  Hypertension: Continue same medication but needs to check blood pressure regularly at home  LIPIDS: Needs fasting lipids drawn  Patient Instructions  Rotate timing of blood sugar monitoring and do some readings 2 hours after lunch or dinner, target <180  Lantus ? 18 daily  Take 3 Metformin daily   Elayne Snare 02/16/2022, 4:14 PM

## 2022-02-16 NOTE — Patient Instructions (Addendum)
Rotate timing of blood sugar monitoring and do some readings 2 hours after lunch or dinner, target <180  Lantus ? 18 daily  Take 3 Metformin daily

## 2022-05-07 ENCOUNTER — Other Ambulatory Visit: Payer: Self-pay | Admitting: Endocrinology

## 2022-05-07 DIAGNOSIS — E1165 Type 2 diabetes mellitus with hyperglycemia: Secondary | ICD-10-CM

## 2022-05-17 ENCOUNTER — Other Ambulatory Visit (INDEPENDENT_AMBULATORY_CARE_PROVIDER_SITE_OTHER): Payer: Medicare Other

## 2022-05-17 DIAGNOSIS — E1165 Type 2 diabetes mellitus with hyperglycemia: Secondary | ICD-10-CM | POA: Diagnosis not present

## 2022-05-17 DIAGNOSIS — Z794 Long term (current) use of insulin: Secondary | ICD-10-CM | POA: Diagnosis not present

## 2022-05-17 DIAGNOSIS — E782 Mixed hyperlipidemia: Secondary | ICD-10-CM | POA: Diagnosis not present

## 2022-05-17 LAB — COMPREHENSIVE METABOLIC PANEL
ALT: 28 U/L (ref 0–53)
AST: 17 U/L (ref 0–37)
Albumin: 4.3 g/dL (ref 3.5–5.2)
Alkaline Phosphatase: 76 U/L (ref 39–117)
BUN: 19 mg/dL (ref 6–23)
CO2: 24 mEq/L (ref 19–32)
Calcium: 9.5 mg/dL (ref 8.4–10.5)
Chloride: 108 mEq/L (ref 96–112)
Creatinine, Ser: 1.05 mg/dL (ref 0.40–1.50)
GFR: 73.82 mL/min (ref >60.00)
Glucose, Bld: 204 mg/dL — ABNORMAL HIGH (ref 70–99)
Potassium: 4.3 mEq/L (ref 3.5–5.1)
Sodium: 140 mEq/L (ref 135–145)
Total Bilirubin: 0.7 mg/dL (ref 0.2–1.2)
Total Protein: 6.9 g/dL (ref 6.0–8.3)

## 2022-05-17 LAB — LIPID PANEL
Cholesterol: 121 mg/dL (ref 0–200)
HDL: 29.1 mg/dL — ABNORMAL LOW (ref >39.00)
LDL Cholesterol: 70 mg/dL (ref 0–99)
NonHDL: 91.43
Total CHOL/HDL Ratio: 4
Triglycerides: 107 mg/dL (ref 0.0–149.0)
VLDL: 21.4 mg/dL (ref 0.0–40.0)

## 2022-05-17 LAB — MICROALBUMIN / CREATININE URINE RATIO
Creatinine,U: 207.8 mg/dL
Microalb Creat Ratio: 0.5 mg/g (ref 0.0–30.0)
Microalb, Ur: 1 mg/dL (ref 0.0–1.9)

## 2022-05-17 LAB — HEMOGLOBIN A1C: Hgb A1c MFr Bld: 7.9 % — ABNORMAL HIGH (ref 4.6–6.5)

## 2022-05-20 ENCOUNTER — Ambulatory Visit: Payer: Medicare Other | Admitting: Endocrinology

## 2022-06-02 ENCOUNTER — Ambulatory Visit (INDEPENDENT_AMBULATORY_CARE_PROVIDER_SITE_OTHER): Payer: Medicare Other | Admitting: Endocrinology

## 2022-06-02 ENCOUNTER — Encounter: Payer: Self-pay | Admitting: Endocrinology

## 2022-06-02 VITALS — BP 154/72 | HR 67 | Ht 72.0 in | Wt 297.0 lb

## 2022-06-02 DIAGNOSIS — E1165 Type 2 diabetes mellitus with hyperglycemia: Secondary | ICD-10-CM | POA: Diagnosis not present

## 2022-06-02 DIAGNOSIS — I10 Essential (primary) hypertension: Secondary | ICD-10-CM | POA: Diagnosis not present

## 2022-06-02 DIAGNOSIS — Z794 Long term (current) use of insulin: Secondary | ICD-10-CM

## 2022-06-02 MED ORDER — EMPAGLIFLOZIN 25 MG PO TABS: 25.0000 mg | ORAL_TABLET | Freq: Every day | ORAL | 2 refills | Status: DC

## 2022-06-02 NOTE — Patient Instructions (Addendum)
Check blood sugars on waking up days a week  Also check blood sugars about 2 hours after meals and do this after different meals by rotation  Recommended blood sugar levels on waking up are 90-130 and about 2 hours after meal is 130-160  Please bring your blood sugar monitor to each visit, thank you  20-22 Insulin   Metformin 3 daily

## 2022-06-02 NOTE — Progress Notes (Signed)
Patient ID: Mark White, male   DOB: [birth year], 66 y.o.   MRN: [id number]      Reason for Appointment : Followup for Type 2 Diabetes  History of Present Illness          Diagnosis: Type 2 diabetes mellitus, date of diagnosis:   2013        Past history:  At diagnosis he was having borderline hyperglycemia. He had previously been taking metformin from his PCP. He was prescribed this twice a day but would tend to forget and take it once a day.  His A1c in early 2014 was 7.2   Because of his relatively high A1c of 7.5 and obesity he was started on Trulicity 1.19 mg weekly in 06/2013 Previously in 09/2014 he had made substantial changes in his diet and lost over 20 pounds His A1c with this was also much better than usual at 5.8  Recent history:   Non-insulin hypoglycemic drugs: Metformin ER 750 mg, 2 tablets daily, Amaryl 4 mg at dinner  INSULIN regimen: Lantus 18  units at night  His A1c is 7.9, was 7.5  Current control, blood sugar patterns and problems identified:  He is not able to get any better control and recently blood sugars over the be higher including fasting of 204 in the fasting lab work last month Not checking his blood sugars after meals as directed again although right after breakfast it was 199 today  He says he has been traveling more Not exercising regularly Has not been able to cut back on snacks or lose weight Also did not increase the metformin to 3 tablets as directed to gain  He has concerns about cost of [place] and he is not sure if he had tolerated this before   Side effects from medications have been: Fatigue and nausea from Trulicity, malaise from Ozempic  Glucose monitoring:       Glucometer:    Accu-Chek  Blood Glucose readings from meter review:  Recent readings 146-199, average 168 for 30 days Previously:  PRE-MEAL Fasting Lunch Dinner Bedtime Overall  Glucose range: 118-150      Mean/median:        POST-MEAL PC Breakfast PC  Lunch PC Dinner  Glucose range:     Mean/median:        Self-care: The diet that the patient has been following is: No specific diet Diet history:  Typical breakfast is an egg,  with bread        Dietician visit: Most recent: 5/14, does not want to pay for this again.      Weight history: Usually ranging from 290-305  Wt Readings from Last 3 Encounters:  06/02/22 297 lb (134.7 kg)  02/16/22 294 lb 3.2 oz (133.4 kg)  11/18/21 295 lb 3.2 oz (133.9 kg)    Diabetes control:    Lab Results  Component Value Date   HGBA1C 7.9 (H) 05/17/2022   HGBA1C 7.5 (H) 02/10/2022   HGBA1C 7.8 (H) 09/06/2021   Lab Results  Component Value Date   MICROALBUR 1.0 05/17/2022   LDLCALC 70 05/17/2022   CREATININE 1.05 05/17/2022   Lab Results  Component Value Date   FRUCTOSAMINE 236 11/16/2021   FRUCTOSAMINE 253 07/05/2021   FRUCTOSAMINE 275 03/22/2021     No visits with results within 1 Week(s) from this visit.  Latest known visit with results is:  Lab on 05/17/2022  Component Date Value Ref Range Status   Cholesterol 05/17/2022 121  0 - 200 mg/dL Final   ATP III Classification       Desirable:  < 200 mg/dL               Borderline High:  200 - 239 mg/dL          High:  > = 240 mg/dL   Triglycerides 05/17/2022 107.0  0.0 - 149.0 mg/dL Final   Normal:  <150 mg/dLBorderline High:  150 - 199 mg/dL   HDL 05/17/2022 29.10 (L)  >39.00 mg/dL Final   VLDL 05/17/2022 21.4  0.0 - 40.0 mg/dL Final   LDL Cholesterol 05/17/2022 70  0 - 99 mg/dL Final   Total CHOL/HDL Ratio 05/17/2022 4   Final                  Men          Women1/2 Average Risk     3.4          3.3Average Risk          5.0          4.42X Average Risk          9.6          7.13X Average Risk          15.0          11.0                       NonHDL 05/17/2022 91.43   Final   NOTE:  Non-HDL goal should be 30 mg/dL higher than patient's LDL goal (i.e. LDL goal of < 70 mg/dL, would have non-HDL goal of < 100 mg/dL)   Microalb, Ur  05/17/2022 1.0  0.0 - 1.9 mg/dL Final   Creatinine,U 05/17/2022 207.8  mg/dL Final   Microalb Creat Ratio 05/17/2022 0.5  0.0 - 30.0 mg/g Final   Sodium 05/17/2022 140  135 - 145 mEq/L Final   Potassium 05/17/2022 4.3  3.5 - 5.1 mEq/L Final   Chloride 05/17/2022 108  96 - 112 mEq/L Final   CO2 05/17/2022 24  19 - 32 mEq/L Final   Glucose, Bld 05/17/2022 204 (H)  70 - 99 mg/dL Final   BUN 05/17/2022 19  6 - 23 mg/dL Final   Creatinine, Ser 05/17/2022 1.05  0.40 - 1.50 mg/dL Final   Total Bilirubin 05/17/2022 0.7  0.2 - 1.2 mg/dL Final   Alkaline Phosphatase 05/17/2022 76  39 - 117 U/L Final   AST 05/17/2022 17  0 - 37 U/L Final   ALT 05/17/2022 28  0 - 53 U/L Final   Total Protein 05/17/2022 6.9  6.0 - 8.3 g/dL Final   Albumin 05/17/2022 4.3  3.5 - 5.2 g/dL Final   GFR 05/17/2022 73.82  >60.00 mL/min Final   Calculated using the CKD-EPI Creatinine Equation (2021)   Calcium 05/17/2022 9.5  8.4 - 10.5 mg/dL Final   Hgb A1c MFr Bld 05/17/2022 7.9 (H)  4.6 - 6.5 % Final   Glycemic Control Guidelines for People with Diabetes:Non Diabetic:  <6%Goal of Therapy: <7%Additional Action Suggested:  >8%     Allergies as of 06/02/2022   No Known Allergies      Medication List        Accurate as of June 02, 2022  4:51 PM. If you have any questions, ask your nurse or doctor.          Accu-Chek Guide test strip Generic drug: glucose blood  CHECK SUGAR 3 TIMES A DAY   Accu-Chek Guide w/Device Kit 1 each by Does not apply route daily. Use glucose meter to check blood sugar as prescribed.   amLODipine 2.5 MG tablet Commonly known as: NORVASC Take by mouth.   aspirin 81 MG tablet Take 1 tablet (81 mg total) by mouth daily. STOP TAKING AND RESTART Dec 29 2011 What changed: additional instructions   atorvastatin 40 MG tablet Commonly known as: LIPITOR Take 40 mg by mouth daily.   empagliflozin 25 MG Tabs tablet Commonly known as: Jardiance Take 1 tablet (25 mg total) by mouth  daily before breakfast. Started by: Elayne Snare, MD   glimepiride 2 MG tablet Commonly known as: AMARYL TAKE 2 TABLETS (4MG TOTAL) BY MOUTH DAILY AFTER SUPPER.   Lantus SoloStar 100 UNIT/ML Solostar Pen Generic drug: insulin glargine INJECT UP TO 15 UNITS EVERY MORNING   losartan 100 MG tablet Commonly known as: COZAAR Take by mouth.   metFORMIN 750 MG 24 hr tablet Commonly known as: GLUCOPHAGE-XR TAKE 1 TABLET AT BREAKFAST AND 2 TABLETS AT DINNER   OneTouch Delica Plus [id number] Misc USE TO CHECK BLOOD SUGAR ONCE A DAY DX CODE E11.65   Accu-Chek Softclix Lancets lancets CHECK SUGARS 3 TIMES DAILY E11.65   Pen Needles 31G X 8 MM Misc Use to inject insulin daily        Allergies: No Known Allergies  Past Medical History:  Diagnosis Date   Allergy    Arthritis    knees   Diabetes mellitus without complication ([place])    Hyperlipidemia    Hypertension    Personal history of adenomatous colonic polyps 01/19/2011   Sleep apnea    not using CPAP   Ulcer    30 years ago    Past Surgical History:  Procedure Laterality Date   COLONOSCOPY  12/14/2011   Procedure: COLONOSCOPY;  Surgeon: Gatha Mayer, MD;  Location: [facility];  Service: Endoscopy;  Laterality: N/A;  colon wiht APC for follow up of colon polyps   COLONOSCOPY N/A 10/10/2012   Procedure: COLONOSCOPY;  Surgeon: Gatha Mayer, MD;  Location: [facility];  Service: Endoscopy;  Laterality: N/A;  Need APC   COLONOSCOPY     COLONOSCOPY W/ POLYPECTOMY  01/13/11   3 polyps, largest 3 cm cecal polyp - adenomas   HOT HEMOSTASIS  12/14/2011   Procedure: HOT HEMOSTASIS (ARGON PLASMA COAGULATION/BICAP);  Surgeon: Gatha Mayer, MD;  Location: [facility];  Service: Endoscopy;  Laterality: N/A;   VASECTOMY     with sedation     Family History  Problem Relation Age of Onset   Colon cancer Mother 56   Heart disease Mother    Heart disease Father    Cancer - Other Daughter        glioblastoma- died age 54     Obesity Neg Hx    Sleep apnea Neg Hx    Stroke Neg Hx    Heart attack Neg Hx    Colon polyps Neg Hx    Rectal cancer Neg Hx    Stomach cancer Neg Hx    Esophageal cancer Neg Hx     Social History:  reports that he has never smoked. He has never used smokeless tobacco. He reports current alcohol use. He reports that he does not use drugs.    Review of Systems       Lipids: Has been on Lipitor for over 10 years for hypercholesterolemia, also followed by PCP. Triglycerides  had been controlled with fenofibrate  LDL is controlled; his HDL has been consistently low    Lab Results  Component Value Date   CHOL 121 05/17/2022   HDL 29.10 (L) 05/17/2022   LDLCALC 70 05/17/2022   LDLDIRECT 72.0 09/06/2021   TRIG 107.0 05/17/2022   CHOLHDL 4 05/17/2022           Has history of sleep apnea, currently untreated   HYPERTENSION: He is on losartan 100 mg and also amlodipine followed by PCP  Blood pressure was better on the second measurement Has not checked much at home  BP Readings from Last 3 Encounters:  06/02/22 (!) 154/72  02/16/22 130/70  11/18/21 (!) 148/68     Last foot exam done with PCP   Physical Examination:  BP (!) 154/72   Pulse 67   Ht 6' (1.829 m)   Wt 297 lb (134.7 kg)   SpO2 94%   BMI 40.28 kg/m    ASSESSMENT:  Diabetes type 2, With obesity    See history of present illness for discussion of current diabetes management, blood sugar patterns and problems identified  His A1c is back up to 7.9  He is on basal insulin with 18 units Lantus daily along with metformin and Amaryl  Currently his BMI was over 40 He has still not made adequate lifestyle changes Reported has excessive malaise with GLP-1 drugs even though he lost 10 pounds with Trulicity Has not been taking SGLT2 drugs because of cost Monitoring is still inadequate  HYPERTENSION: Blood pressure is high but back to normal on second measurement as before    PLAN:  For  diabetes:  Increase metformin to 3 tablets Go up to 22 units on the insulin If blood sugars are still over 130 in the morning he can go up another 2 units Trial of JARDIANCE 10 mg daily Discussed action of SGLT 2 drugs on lowering glucose by decreasing kidney absorption of glucose, benefits of weight loss and lower blood pressure, possible side effects He can start with samples and also when getting prescription he can first try taking half of the 25 mg Jardiance daily  Regular exercise Improve diet as discussed Start monitoring using freestyle libre 3 Discussed how this would work and will send prescription to DME supplier and he can call us for instructions Also discussed possibility of changing type of insulin if he has high postprandial readings consistently  Also referral made to diabetes educator and will need diet review also  Hypertension: Not well controlled and should improve with weight loss, exercise and potentially also with adding Jardiance To check regularly at home  LIPIDS: Well-controlled  Patient Instructions  Check blood sugars on waking up days a week  Also check blood sugars about 2 hours after meals and do this after different meals by rotation  Recommended blood sugar levels on waking up are 90-130 and about 2 hours after meal is 130-160  Please bring your blood sugar monitor to each visit, thank you  20-22 Insulin   Metformin 3 daily    Janyiah Silveri 06/02/2022, 4:51 PM

## 2022-07-13 ENCOUNTER — Other Ambulatory Visit: Payer: Self-pay | Admitting: Endocrinology

## 2022-07-13 DIAGNOSIS — E1165 Type 2 diabetes mellitus with hyperglycemia: Secondary | ICD-10-CM

## 2022-08-12 ENCOUNTER — Other Ambulatory Visit: Payer: Self-pay | Admitting: Endocrinology

## 2022-08-12 DIAGNOSIS — E1165 Type 2 diabetes mellitus with hyperglycemia: Secondary | ICD-10-CM

## 2022-09-01 LAB — HM DIABETES EYE EXAM

## 2022-09-02 ENCOUNTER — Encounter: Payer: Self-pay | Admitting: Endocrinology

## 2022-09-02 ENCOUNTER — Encounter: Payer: Self-pay | Admitting: Internal Medicine

## 2022-09-08 ENCOUNTER — Other Ambulatory Visit: Payer: Self-pay | Admitting: Endocrinology

## 2022-09-08 DIAGNOSIS — Z794 Long term (current) use of insulin: Secondary | ICD-10-CM

## 2022-09-12 ENCOUNTER — Other Ambulatory Visit (INDEPENDENT_AMBULATORY_CARE_PROVIDER_SITE_OTHER): Payer: Medicare Other

## 2022-09-12 ENCOUNTER — Encounter: Payer: Medicare Other | Attending: Endocrinology | Admitting: Dietician

## 2022-09-12 ENCOUNTER — Encounter: Payer: Self-pay | Admitting: Dietician

## 2022-09-12 ENCOUNTER — Other Ambulatory Visit: Payer: Self-pay | Admitting: Endocrinology

## 2022-09-12 VITALS — Ht 72.0 in | Wt 299.0 lb

## 2022-09-12 DIAGNOSIS — E119 Type 2 diabetes mellitus without complications: Secondary | ICD-10-CM | POA: Insufficient documentation

## 2022-09-12 DIAGNOSIS — Z794 Long term (current) use of insulin: Secondary | ICD-10-CM | POA: Diagnosis not present

## 2022-09-12 DIAGNOSIS — E1165 Type 2 diabetes mellitus with hyperglycemia: Secondary | ICD-10-CM

## 2022-09-12 LAB — BASIC METABOLIC PANEL
BUN: 14 mg/dL (ref 6–23)
CO2: 25 mEq/L (ref 19–32)
Calcium: 9.5 mg/dL (ref 8.4–10.5)
Chloride: 105 mEq/L (ref 96–112)
Creatinine, Ser: 1.02 mg/dL (ref 0.40–1.50)
GFR: 76.25 mL/min (ref >60.00)
Glucose, Bld: 184 mg/dL — ABNORMAL HIGH (ref 70–99)
Potassium: 4.2 mEq/L (ref 3.5–5.1)
Sodium: 139 mEq/L (ref 135–145)

## 2022-09-12 LAB — HEMOGLOBIN A1C: Hgb A1c MFr Bld: 7.7 % — ABNORMAL HIGH (ref 4.6–6.5)

## 2022-09-12 NOTE — Patient Instructions (Signed)
Do not deep fry Not diet soda/ and drinks  Increase your plant base and vegetables. What is HALT?  Why are you eating?  Hungry  Angry  Lonely  Tired   Bored or Depressed.  Goal:  Eat Breakfast, Lunch, Dinner daily.  Add a nourishing snack if hungry.   Mindfulness:  Consistently scheduled meal - avoid skipping  Choices  Eat slowly  Away from distraction (sitting in kitchen or dining room)  Stop eating when satisfied  Before a snack ask, "Am I hungry or eating for another reason?"   "What can I do instead if I am not hungry?"  Try to find something every day that brings you joy!

## 2022-09-12 NOTE — Progress Notes (Signed)
Diabetes Self-Management Education  Visit Type: First/Initial  Appt. Start Time: 11:15 Appt. End Time: 12:30  09/12/2022  [name], identified by name and date of birth, is a 67 y.o. male with a diagnosis of Diabetes: Type 2.   ASSESSMENT  Patient are here alone. He would like to lose weight.  Fasting glucose generally in the 150 which is higher due be more more hungry and eating more.  MD suggested a CGM, but Pt declined at this time.  History includes T2DM, HTN, HLD, OSA (does not use C pap)  Labs:  A1C 7.9 % 05/17/22 increase to 7.5 % 02/10/22. Medications Glimepiride 2 mg Insulin glargine 100 Units Metformin 750 mg 24 hr.  Patient lives with his wife. He is retired from [employer], and has a PHD in Paediatric nurse.   Height 6' (1.829 m), weight 299 lb (135.6 kg). Body mass index is 40.55 kg/m.   Diabetes Self-Management Education - 09/12/22 1128       Visit Information   Visit Type First/Initial      Initial Visit   Diabetes Type Type 2    Date Diagnosed 7 years    Are you currently following a meal plan? No      Health Coping   How would you rate your overall health? Good      Psychosocial Assessment   Patient Belief/Attitude about Diabetes Motivated to manage diabetes    What is the hardest part about your diabetes right now, causing you the most concern, or is the most worrisome to you about your diabetes?   Other (comment)   Weight loss   Self-care barriers None    Self-management support Doctor's office    Other persons present Patient    Patient Concerns Nutrition/Meal planning;Glycemic Control;Weight Control    Special Needs None    Preferred Learning Style No preference indicated    Learning Readiness Ready    How often do you need to have someone help you when you read instructions, pamphlets, or other written materials from your doctor or pharmacy? 1 - Never    What is the last grade level you completed in school? PHD in agriculture       Pre-Education Assessment   Patient understands the diabetes disease and treatment process. Needs Instruction    Patient understands incorporating nutritional management into lifestyle. Needs Instruction    Patient undertands incorporating physical activity into lifestyle. Needs Instruction    Patient understands using medications safely. Needs Instruction    Patient understands monitoring blood glucose, interpreting and using results Needs Instruction    Patient understands prevention, detection, and treatment of acute complications. Needs Instruction    Patient understands prevention, detection, and treatment of chronic complications. Needs Instruction    Patient understands how to develop strategies to address psychosocial issues. Needs Instruction    Patient understands how to develop strategies to promote health/change behavior. Needs Instruction      Complications   Last HgB A1C per patient/outside source 7.9 %    How often do you check your blood sugar? 1-2 times/day    Fasting Blood glucose range (mg/dL) 130-179    Number of hypoglycemic episodes per month 0    Have you had a dilated eye exam in the past 12 months? Yes    Have you had a dental exam in the past 12 months? Yes    Are you checking your feet? No      Dietary Intake   Breakfast Two eggs over easy,  toast multigrane, coffee    Snack (morning) nuts ocacional    Lunch Hamburger ,  fryes, sardines, crackes, soup or salad with vinager base    Snack (afternoon) nacho chips    Dinner Steak gravy and rice, vegetables, tobu soy beans food, pizza, chicken salad, fish grill,pork chops    Beverage(s) water, unsweet tea, diet soda, diet gatorate 3-4 a day.      Activity / Exercise   Activity / Exercise Type ADL's;Light (walking / raking leaves)    How many days per week do you exercise? 2    How many minutes per day do you exercise? 30    Total minutes per week of exercise 60      Patient Education   Previous Diabetes  Education No    Disease Pathophysiology Definition of diabetes, type 1 and 2, and the diagnosis of diabetes;Explored patient's options for treatment of their diabetes;Other (comment)    Healthy Eating Plate Method;Role of diet in the treatment of diabetes and the relationship between the three main macronutrients and blood glucose level;Meal timing in regards to the patients' current diabetes medication.;Effects of alcohol on blood glucose and safety factors with consumption of alcohol.;Meal options for control of blood glucose level and chronic complications.    Being Active Role of exercise on diabetes management, blood pressure control and cardiac health.    Medications Reviewed patients medication for diabetes, action, purpose, timing of dose and side effects.    Monitoring Other (comment);Identified appropriate SMBG and/or A1C goals.   Discoused CGM   Acute complications Taught prevention, symptoms, and  treatment of hypoglycemia - the 15 rule.;Discussed and identified patients' prevention, symptoms, and treatment of hyperglycemia.    Chronic complications Relationship between chronic complications and blood glucose control;Dental care;Retinopathy and reason for yearly dilated eye exams    Diabetes Stress and Support Role of stress on diabetes;Identified and addressed patients feelings and concerns about diabetes      Individualized Goals (developed by patient)   Nutrition General guidelines for healthy choices and portions discussed    Physical Activity Exercise 5-7 days per week;30 minutes per day    Medications take my medication as prescribed    Monitoring  Test my blood glucose as discussed    Problem Solving Addressing barriers to behavior change    Reducing Risk examine blood glucose patterns      Post-Education Assessment   Patient understands the diabetes disease and treatment process. Demonstrates understanding / competency    Patient understands incorporating nutritional  management into lifestyle. Demonstrates understanding / competency    Patient undertands incorporating physical activity into lifestyle. Demonstrates understanding / competency    Patient understands using medications safely. Demonstrates understanding / competency    Patient understands monitoring blood glucose, interpreting and using results Demonstrates understanding / competency    Patient understands prevention, detection, and treatment of acute complications. Demonstrates understanding / competency    Patient understands prevention, detection, and treatment of chronic complications. Demonstrates understanding / competency    Patient understands how to develop strategies to address psychosocial issues. Demonstrates understanding / competency    Patient understands how to develop strategies to promote health/change behavior. Comprehends key points      Outcomes   Expected Outcomes Demonstrated interest in learning. Expect positive outcomes    Future DMSE 3-4 months    Program Status Not Completed             Individualized Plan for Diabetes Self-Management Training:   Learning Objective:  Patient will have a greater understanding of diabetes self-management. Patient education plan is to attend individual and/or group sessions per assessed needs and concerns.   Plan:   Patient Instructions  Do not deep fry Not diet soda/ and drinks  Increase your plant base and vegetables. What is HALT?  Why are you eating?  Hungry  Angry  Lonely  Tired   Bored or Depressed.  Goal:  Eat Breakfast, Lunch, Dinner daily.  Add a nourishing snack if hungry.   Mindfulness:  Consistently scheduled meal - avoid skipping  Choices  Eat slowly  Away from distraction (sitting in kitchen or dining room)  Stop eating when satisfied  Before a snack ask, "Am I hungry or eating for another reason?"   "What can I do instead if I am not hungry?"  Try to find something every day that brings you  joy!    Expected Outcomes:  Demonstrated interest in learning. Expect positive outcomes  Education material provided: ADA - How to Thrive: A Guide for Your Journey with Diabetes, Meal plan card, Snack sheet, and Diabetes Resources, ACLM ([place]) packet   If problems or questions, patient to contact team via:  Phone  Future DSME appointment: 3-4 months

## 2022-09-15 ENCOUNTER — Other Ambulatory Visit: Payer: Self-pay | Admitting: Endocrinology

## 2022-09-15 ENCOUNTER — Encounter: Payer: Self-pay | Admitting: Endocrinology

## 2022-09-15 ENCOUNTER — Ambulatory Visit (INDEPENDENT_AMBULATORY_CARE_PROVIDER_SITE_OTHER): Payer: Medicare Other | Admitting: Endocrinology

## 2022-09-15 VITALS — BP 144/68 | HR 75 | Ht 72.0 in | Wt 298.0 lb

## 2022-09-15 DIAGNOSIS — Z794 Long term (current) use of insulin: Secondary | ICD-10-CM

## 2022-09-15 DIAGNOSIS — I10 Essential (primary) hypertension: Secondary | ICD-10-CM | POA: Diagnosis not present

## 2022-09-15 DIAGNOSIS — E1165 Type 2 diabetes mellitus with hyperglycemia: Secondary | ICD-10-CM

## 2022-09-15 MED ORDER — EMPAGLIFLOZIN 25 MG PO TABS: 25.0000 mg | ORAL_TABLET | Freq: Every day | ORAL | 2 refills | Status: DC

## 2022-09-15 NOTE — Patient Instructions (Addendum)
Check blood sugars on waking up 3-4 days a week  Also check blood sugars about 2 hours after meals and do this after different meals by rotation  Recommended blood sugar levels on waking up are 90-130 and about 2 hours after meal is 130-160  Please bring your blood sugar monitor to each visit, thank you  Check BP weekly  Take 25 units Lantus

## 2022-09-15 NOTE — Progress Notes (Signed)
Patient ID: Mark White, male   DOB: 1956/01/07, 67 y.o.   MRN: [id number]      Reason for Appointment : Followup for Type 2 Diabetes  History of Present Illness          Diagnosis: Type 2 diabetes mellitus, date of diagnosis:   2013        Past history:  At diagnosis he was having borderline hyperglycemia. He had previously been taking metformin from his PCP. He was prescribed this twice a day but would tend to forget and take it once a day.  His A1c in early 2014 was 7.2   Because of his relatively high A1c of 7.5 and obesity he was started on Trulicity [id number] mg weekly in 06/2013 Previously in 09/2014 he had made substantial changes in his diet and lost over 20 pounds His A1c with this was also much better than usual at 5.8  Recent history:   Non-insulin hypoglycemic drugs: Metformin ER 750 mg, 2 tablets daily, Amaryl 4 mg at dinner  INSULIN regimen: Lantus 22  units at night  His A1c is 7.7  Current control, blood sugar patterns and problems identified:  He is is still having similar readings at home compared to his last visit Again his blood sugars appear to be higher after meals although he has only a few readings Fasting readings are somewhat variable but probably averaging about 150 He says his insurance did not cover his Jardiance last year and not clear why Weight is about the same He has seen the dietitian a couple of days ago and he feels like he can make significant changes He has not been to increase the metformin to 3 tablets because of regards to the third tablet at night Currently not exercising   Side effects from medications have been: Fatigue and nausea from Trulicity, malaise from Ozempic  Glucose monitoring:       Glucometer:    Accu-Chek  Blood Glucose readings from meter download:   PRE-MEAL Fasting Lunch Dinner Bedtime Overall  Glucose range: 127-170   143-275   Mean/median:     69   POST-MEAL PC Breakfast PC Lunch PC Dinner   Glucose range: 221, 274    Mean/median:       Previous readings 146-199, average 168 for 30 days  Self-care: The diet that the patient has been following is: No specific diet Diet history:  Typical breakfast is an egg,  with bread        Dietician visit: Most recent: 09/2022   Weight history: Usually ranging from 290-305  Wt Readings from Last 3 Encounters:  09/15/22 298 lb (135.2 kg)  09/12/22 299 lb (135.6 kg)  06/02/22 297 lb (134.7 kg)    Diabetes control:    Lab Results  Component Value Date   HGBA1C 7.7 (H) 09/12/2022   HGBA1C 7.9 (H) 05/17/2022   HGBA1C 7.5 (H) 02/10/2022   Lab Results  Component Value Date   MICROALBUR 1.0 05/17/2022   LDLCALC 70 05/17/2022   CREATININE 1.02 09/12/2022   Lab Results  Component Value Date   FRUCTOSAMINE 236 11/16/2021   FRUCTOSAMINE 253 07/05/2021   FRUCTOSAMINE 275 03/22/2021     Lab on 09/12/2022  Component Date Value Ref Range Status   Hgb A1c MFr Bld 09/12/2022 7.7 (H)  4.6 - 6.5 % Final   Glycemic Control Guidelines for People with Diabetes:Non Diabetic:  <6%Goal of Therapy: <7%Additional Action Suggested:  >8%    Sodium 09/12/2022  139  135 - 145 mEq/L Final   Potassium 09/12/2022 4.2  3.5 - 5.1 mEq/L Final   Chloride 09/12/2022 105  96 - 112 mEq/L Final   CO2 09/12/2022 25  19 - 32 mEq/L Final   Glucose, Bld 09/12/2022 184 (H)  70 - 99 mg/dL Final   BUN 09/12/2022 14  6 - 23 mg/dL Final   Creatinine, Ser 09/12/2022 1.02  0.40 - 1.50 mg/dL Final   GFR 09/12/2022 76.25  >60.00 mL/min Final   Calculated using the CKD-EPI Creatinine Equation (2021)   Calcium 09/12/2022 9.5  8.4 - 10.5 mg/dL Final    Allergies as of 09/15/2022   No Known Allergies      Medication List        Accurate as of September 15, 2022 10:27 AM. If you have any questions, ask your nurse or doctor.          Accu-Chek Guide test strip Generic drug: glucose blood CHECK SUGAR 3 TIMES A DAY   Accu-Chek Guide w/Device Kit 1 each by  Does not apply route daily. Use glucose meter to check blood sugar as prescribed.   amLODipine 2.5 MG tablet Commonly known as: NORVASC Take by mouth.   aspirin 81 MG tablet Take 1 tablet (81 mg total) by mouth daily. STOP TAKING AND RESTART Dec 29 2011 What changed: additional instructions   atorvastatin 40 MG tablet Commonly known as: LIPITOR Take 40 mg by mouth daily.   B-D ULTRAFINE III SHORT PEN 31G X 8 MM Misc Generic drug: Insulin Pen Needle USE TO INJECT INSULIN DAILY   empagliflozin 25 MG Tabs tablet Commonly known as: Jardiance Take 1 tablet (25 mg total) by mouth daily before breakfast.   glimepiride 2 MG tablet Commonly known as: AMARYL TAKE 2 TABLETS (4MG TOTAL) BY MOUTH DAILY AFTER SUPPER.   Lantus SoloStar 100 UNIT/ML Solostar Pen Generic drug: insulin glargine INJECT UP TO 15 UNITS EVERY MORNING What changed: See the new instructions.   losartan 100 MG tablet Commonly known as: COZAAR Take by mouth.   metFORMIN 750 MG 24 hr tablet Commonly known as: GLUCOPHAGE-XR TAKE 1 TABLET AT BREAKFAST AND 2 TABLETS AT DINNER   OneTouch Delica Plus 0000000 Misc USE TO CHECK BLOOD SUGAR ONCE A DAY DX CODE E11.65   Accu-Chek Softclix Lancets lancets CHECK SUGARS 3 TIMES DAILY E11.65        Allergies: No Known Allergies  Past Medical History:  Diagnosis Date   Allergy    Arthritis    knees   Diabetes mellitus without complication ([place])    Hyperlipidemia    Hypertension    Personal history of adenomatous colonic polyps 01/19/2011   Sleep apnea    not using CPAP   Ulcer    30 years ago    Past Surgical History:  Procedure Laterality Date   COLONOSCOPY  12/14/2011   Procedure: COLONOSCOPY;  Surgeon: Gatha Mayer, MD;  Location: [facility];  Service: Endoscopy;  Laterality: N/A;  colon wiht APC for follow up of colon polyps   COLONOSCOPY N/A 10/10/2012   Procedure: COLONOSCOPY;  Surgeon: Gatha Mayer, MD;  Location: [facility];  Service:  Endoscopy;  Laterality: N/A;  Need APC   COLONOSCOPY     COLONOSCOPY W/ POLYPECTOMY  01/13/11   3 polyps, largest 3 cm cecal polyp - adenomas   HOT HEMOSTASIS  12/14/2011   Procedure: HOT HEMOSTASIS (ARGON PLASMA COAGULATION/BICAP);  Surgeon: Gatha Mayer, MD;  Location: [facility];  Service: Endoscopy;  Laterality: N/A;   VASECTOMY     with sedation     Family History  Problem Relation Age of Onset   Colon cancer Mother 73   Heart disease Mother    Heart disease Father    Cancer - Other Daughter        glioblastoma- died age 82    Obesity Neg Hx    Sleep apnea Neg Hx    Stroke Neg Hx    Heart attack Neg Hx    Colon polyps Neg Hx    Rectal cancer Neg Hx    Stomach cancer Neg Hx    Esophageal cancer Neg Hx     Social History:  reports that he has never smoked. He has never used smokeless tobacco. He reports current alcohol use of about 2.0 standard drinks of alcohol per week. He reports that he does not use drugs.    Review of Systems       Lipids: Has been on Lipitor for over 10 years for hypercholesterolemia, also followed by PCP. Triglycerides had been controlled with fenofibrate  LDL is controlled; his HDL has been consistently low    Lab Results  Component Value Date   CHOL 121 05/17/2022   HDL 29.10 (L) 05/17/2022   LDLCALC 70 05/17/2022   LDLDIRECT 72.0 09/06/2021   TRIG 107.0 05/17/2022   CHOLHDL 4 05/17/2022           Has history of sleep apnea, currently untreated   HYPERTENSION: He is on losartan 100 mg and also amlodipine followed by PCP  Blood pressure usually has been intermittent checked on the second attempt Has not checked at home  BP Readings from Last 3 Encounters:  09/15/22 (!) 144/68  06/02/22 (!) 154/72  02/16/22 130/70     Last foot exam done with PCP   Physical Examination:  BP (!) 144/68 (BP Location: Left Arm, Patient Position: Sitting, Cuff Size: Normal)   Pulse 75   Ht 6' (1.829 m)   Wt 298 lb (135.2 kg)   SpO2 98%    BMI 40.42 kg/m    ASSESSMENT:  Diabetes type 2, With obesity    See history of present illness for discussion of current diabetes management, blood sugar patterns and problems identified  His A1c is still above target at 7.7  He is on basal insulin with 22 units Lantus daily along with metformin and Amaryl  Again his BMI was over 40 Again apparently not able to afford SGLT2 drugs  Reported has excessive malaise with GLP-1 drugs even though he lost 10 pounds with Trulicity Was not able to start freestyle libre at last visit as apparently the prescription was not sent to the DME supplier and he did not call us back on this  HYPERTENSION: Blood pressure is noted to be higher on first measurement today    PLAN:  For diabetes:  He can try to control 3 metformin's together Go up to 25 units on the Lantus dosage If blood sugars are still over 130 in the morning consistently he can go up another 2 units Trial of JARDIANCE 25 mg 1/2 daily, new prescription sent again  Again reminded him of the benefits of Jardiance  Start back on exercise as tolerated including going to the gym or playing pickle ball Improve diet as discussed Start monitoring using freestyle libre 3 Discussed benefits of the this and will send prescription to DME supplier Discussed that he needs to check his blood sugars  after meals with more consistently, he will need to let us know if he needs any help starting this Patient information booklet given for this  He will start following the diet that was explained to him by the nutritionist  Hypertension: Start taking blood pressure at home also  LIPIDS: Well-controlled  There are no Patient Instructions on file for this visit.  Total visit time for evaluation and management and counseling = 30 minutes  Elayne Snare 09/15/2022, 10:27 AM

## 2022-11-06 ENCOUNTER — Other Ambulatory Visit: Payer: Self-pay | Admitting: Endocrinology

## 2022-11-06 DIAGNOSIS — E1165 Type 2 diabetes mellitus with hyperglycemia: Secondary | ICD-10-CM

## 2022-12-14 ENCOUNTER — Other Ambulatory Visit (INDEPENDENT_AMBULATORY_CARE_PROVIDER_SITE_OTHER): Payer: Medicare Other

## 2022-12-14 DIAGNOSIS — E1165 Type 2 diabetes mellitus with hyperglycemia: Secondary | ICD-10-CM | POA: Diagnosis not present

## 2022-12-14 DIAGNOSIS — Z794 Long term (current) use of insulin: Secondary | ICD-10-CM | POA: Diagnosis not present

## 2022-12-14 LAB — BASIC METABOLIC PANEL
BUN: 14 mg/dL (ref 6–23)
CO2: 24 mEq/L (ref 19–32)
Calcium: 9.4 mg/dL (ref 8.4–10.5)
Chloride: 105 mEq/L (ref 96–112)
Creatinine, Ser: 0.96 mg/dL (ref 0.40–1.50)
GFR: 81.86 mL/min (ref >60.00)
Glucose, Bld: 157 mg/dL — ABNORMAL HIGH (ref 70–99)
Potassium: 3.8 mEq/L (ref 3.5–5.1)
Sodium: 140 mEq/L (ref 135–145)

## 2022-12-14 LAB — HEMOGLOBIN A1C: Hgb A1c MFr Bld: 7.5 % — ABNORMAL HIGH (ref 4.6–6.5)

## 2022-12-16 ENCOUNTER — Encounter: Payer: Self-pay | Admitting: Endocrinology

## 2022-12-16 ENCOUNTER — Ambulatory Visit (INDEPENDENT_AMBULATORY_CARE_PROVIDER_SITE_OTHER): Payer: Medicare Other | Admitting: Endocrinology

## 2022-12-16 VITALS — BP 132/72 | HR 64 | Resp 18 | Ht 72.0 in | Wt 298.4 lb

## 2022-12-16 DIAGNOSIS — E1165 Type 2 diabetes mellitus with hyperglycemia: Secondary | ICD-10-CM | POA: Diagnosis not present

## 2022-12-16 DIAGNOSIS — Z794 Long term (current) use of insulin: Secondary | ICD-10-CM | POA: Diagnosis not present

## 2022-12-16 DIAGNOSIS — I10 Essential (primary) hypertension: Secondary | ICD-10-CM | POA: Diagnosis not present

## 2022-12-16 MED ORDER — NOVOLOG FLEXPEN 100 UNIT/ML ~~LOC~~ SOPN: 12.0000 [IU] | PEN_INJECTOR | Freq: Three times a day (TID) | SUBCUTANEOUS | 11 refills | Status: DC

## 2022-12-16 MED ORDER — BD PEN NEEDLE SHORT U/F 31G X 8 MM MISC
3 refills | Status: DC
Start: 2022-12-16 — End: 2023-07-24

## 2022-12-16 NOTE — Patient Instructions (Addendum)
Start 6-8 units   Check blood sugars on waking up    Also check blood sugars about 2 hours after meals and do this after different meals by rotation  Recommended blood sugar levels on waking up are 90-130 and about 2 hours after meal is 130-160  Please bring your blood sugar monitor to each visit, thank you  Walk daily

## 2022-12-16 NOTE — Progress Notes (Signed)
Patient ID: Mark White, male   DOB: 1955-08-06, 67 y.o.   MRN: [id number]      Reason for Appointment : Followup for Type 2 Diabetes  History of Present Illness          Diagnosis: Type 2 diabetes mellitus, date of diagnosis:   2013        Past history:  At diagnosis he was having borderline hyperglycemia. He had previously been taking metformin from his PCP. He was prescribed this twice a day but would tend to forget and take it once a day.  His A1c in early 2014 was 7.2   Because of his relatively high A1c of 7.5 and obesity he was started on Trulicity 0.75 mg weekly in 06/2013 Previously in 09/2014 he had made substantial changes in his diet and lost over 20 pounds His A1c with this was also much better than usual at 5.8  Recent history:   Non-insulin hypoglycemic drugs: Metformin ER 750 mg, 2 tablets daily, Amaryl 4 mg at dinner  INSULIN regimen: Lantus 2 units at night  His A1c is 7.5 only slightly better  Current control, blood sugar patterns and problems identified:  He is now using the freestyle libre to check his blood sugars With this he has much more data available of his blood sugars especially postprandial Previously was having variably high readings after meals but now his blood sugars are very consistently high after dinner and averaging 230 Fasting readings are excellent with his Lantus He is doing a little walking periodically but not consistently and he thinks his blood sugars are better when he does exercise He thinks he can do better with his diet in the evenings Again he says his insurance did not cover his Jardiance when he tried this year Still not able to lose weight   Side effects from medications have been: Fatigue and nausea from Trulicity, malaise from [employer] version 3 download for the last 2 weeks shows the following pattern  Overnight blood sugars are high at bedtime and the only close to 200 before midnight and  then declining significantly about 2 AM with lowest readings around 4-7 AM and subsequent dawn phenomenon Postprandial readings after his first meal are modestly increased overall with some variability Blood sugars are still relatively high before dinner averaging 150 but occasionally low normal No hypoglycemia during the day Blood sugars are generally over 180 after dinner and significantly high between 6 PM and 1 AM Blood sugar data is not present before 12/07/2022  CGM use % of time 65  2-week average/GV 164  Time in range      64%  % Time Above 180 30  % Time above 250 6  % Time Below 70      PRE-MEAL Fasting Lunch Dinner Bedtime Overall  Glucose range:       Averages: 115  150     POST-MEAL PC Breakfast PC Lunch PC Dinner  Glucose range:     Averages: 187  231    Blood Glucose readings from meter download:   PRE-MEAL Fasting Lunch Dinner Bedtime Overall  Glucose range: 127-170   143-275   Mean/median:     69   POST-MEAL PC Breakfast PC Lunch PC Dinner  Glucose range: 221, 274    Mean/median:       Previous readings 146-199, average 168 for 30 days  Self-care: The diet that the patient has been following is: No specific diet Diet  history:  Typical breakfast is an egg,  with bread        Dietician visit: Most recent: 09/2022   Weight history: Usually ranging from 290-305  Wt Readings from Last 3 Encounters:  12/16/22 298 lb 6.4 oz (135.4 kg)  09/15/22 298 lb (135.2 kg)  09/12/22 299 lb (135.6 kg)    Diabetes control:    Lab Results  Component Value Date   HGBA1C 7.5 (H) 12/14/2022   HGBA1C 7.7 (H) 09/12/2022   HGBA1C 7.9 (H) 05/17/2022   Lab Results  Component Value Date   MICROALBUR 1.0 05/17/2022   LDLCALC 70 05/17/2022   CREATININE 0.96 12/14/2022   Lab Results  Component Value Date   FRUCTOSAMINE 236 11/16/2021   FRUCTOSAMINE 253 07/05/2021   FRUCTOSAMINE 275 03/22/2021     Lab on 12/14/2022  Component Date Value Ref Range Status    Sodium 12/14/2022 140  135 - 145 mEq/L Final   Potassium 12/14/2022 3.8  3.5 - 5.1 mEq/L Final   Chloride 12/14/2022 105  96 - 112 mEq/L Final   CO2 12/14/2022 24  19 - 32 mEq/L Final   Glucose, Bld 12/14/2022 157 (H)  70 - 99 mg/dL Final   BUN 16/05/9603 14  6 - 23 mg/dL Final   Creatinine, Ser 12/14/2022 0.96  0.40 - 1.50 mg/dL Final   GFR 54/04/8118 81.86  >60.00 mL/min Final   Calculated using the CKD-EPI Creatinine Equation (2021)   Calcium 12/14/2022 9.4  8.4 - 10.5 mg/dL Final   Hgb J4N MFr Bld 12/14/2022 7.5 (H)  4.6 - 6.5 % Final   Glycemic Control Guidelines for People with Diabetes:Non Diabetic:  <6%Goal of Therapy: <7%Additional Action Suggested:  >8%     Allergies as of 12/16/2022   No Known Allergies      Medication List        Accurate as of Dec 16, 2022 10:46 AM. If you have any questions, ask your nurse or doctor.          Accu-Chek Guide test strip Generic drug: glucose blood CHECK SUGAR 3 TIMES A DAY   Accu-Chek Guide w/Device Kit 1 each by Does not apply route daily. Use glucose meter to check blood sugar as prescribed.   amLODipine 2.5 MG tablet Commonly known as: NORVASC Take by mouth.   aspirin 81 MG tablet Take 1 tablet (81 mg total) by mouth daily. STOP TAKING AND RESTART Dec 29 2011 What changed: additional instructions   atorvastatin 40 MG tablet Commonly known as: LIPITOR Take 40 mg by mouth daily.   B-D ULTRAFINE III SHORT PEN 31G X 8 MM Misc Generic drug: Insulin Pen Needle USE TO INJECT INSULIN DAILY   empagliflozin 25 MG Tabs tablet Commonly known as: Jardiance Take 1 tablet (25 mg total) by mouth daily before breakfast.   glimepiride 2 MG tablet Commonly known as: AMARYL TAKE 2 TABLETS BY MOUTH DAILY AFTER SUPPER.   Lantus SoloStar 100 UNIT/ML Solostar Pen Generic drug: insulin glargine INJECT UP TO 15 UNITS EVERY MORNING What changed: See the new instructions.   losartan 100 MG tablet Commonly known as: COZAAR Take by  mouth.   metFORMIN 750 MG 24 hr tablet Commonly known as: GLUCOPHAGE-XR TAKE 1 TABLET AT BREAKFAST AND 2 TABLETS AT DINNER What changed: See the new instructions.   OneTouch Delica Plus Lancet33G Misc USE TO CHECK BLOOD SUGAR ONCE A DAY DX CODE E11.65   Accu-Chek Softclix Lancets lancets CHECK SUGARS 3 TIMES DAILY E11.65  Allergies: No Known Allergies  Past Medical History:  Diagnosis Date   Allergy    Arthritis    knees   Diabetes mellitus without complication ([facility])    Hyperlipidemia    Hypertension    Personal history of adenomatous colonic polyps 01/19/2011   Sleep apnea    not using CPAP   Ulcer    30 years ago    Past Surgical History:  Procedure Laterality Date   COLONOSCOPY  12/14/2011   Procedure: COLONOSCOPY;  Surgeon: Iva Boop, MD;  Location: [facility];  Service: Endoscopy;  Laterality: N/A;  colon wiht APC for follow up of colon polyps   COLONOSCOPY N/A 10/10/2012   Procedure: COLONOSCOPY;  Surgeon: Iva Boop, MD;  Location: [facility];  Service: Endoscopy;  Laterality: N/A;  Need APC   COLONOSCOPY     COLONOSCOPY W/ POLYPECTOMY  01/13/11   3 polyps, largest 3 cm cecal polyp - adenomas   HOT HEMOSTASIS  12/14/2011   Procedure: HOT HEMOSTASIS (ARGON PLASMA COAGULATION/BICAP);  Surgeon: Iva Boop, MD;  Location: [facility];  Service: Endoscopy;  Laterality: N/A;   VASECTOMY     with sedation     Family History  Problem Relation Age of Onset   Colon cancer Mother 42   Heart disease Mother    Heart disease Father    Cancer - Other Daughter        glioblastoma- died age 39    Obesity Neg Hx    Sleep apnea Neg Hx    Stroke Neg Hx    Heart attack Neg Hx    Colon polyps Neg Hx    Rectal cancer Neg Hx    Stomach cancer Neg Hx    Esophageal cancer Neg Hx     Social History:  reports that he has never smoked. He has never used smokeless tobacco. He reports current alcohol use of about 2.0 standard drinks of alcohol per week. He  reports that he does not use drugs.    Review of Systems       Lipids: Has been on Lipitor for over 10 years for hypercholesterolemia, also followed by PCP. Triglycerides had been controlled with fenofibrate  LDL is controlled; his HDL has been consistently low    Lab Results  Component Value Date   CHOL 121 05/17/2022   HDL 29.10 (L) 05/17/2022   LDLCALC 70 05/17/2022   LDLDIRECT 72.0 09/06/2021   TRIG 107.0 05/17/2022   CHOLHDL 4 05/17/2022           Has history of sleep apnea, currently untreated   HYPERTENSION: He is on losartan 100 mg and also amlodipine followed by PCP  Still not checking blood pressure at home  BP Readings from Last 3 Encounters:  12/16/22 132/72  09/15/22 (!) 144/68  06/02/22 (!) 154/72    Last foot exam done with PCP   Physical Examination:  BP 132/72   Pulse 64   Resp 18   Ht 6' (1.829 m)   Wt 298 lb 6.4 oz (135.4 kg)   SpO2 97%   BMI 40.47 kg/m    ASSESSMENT:  Diabetes type 2, With obesity    See history of present illness for discussion of current diabetes management, blood sugar patterns and problems identified  His A1c is still above target at 7.5  He is on basal insulin with 25 units Lantus daily along with metformin and Amaryl  Again apparently not able to afford SGLT2 drugs  Now using  freestyle libre sensor and his blood sugars are averaging over 200 after supper even with good control overnight Also has dawn phenomenon Still has difficulty losing weight  HYPERTENSION: Blood pressure is well-controlled    PLAN:  For diabetes:   Discussed that he will need to start mealtime insulin at least at dinnertime to accommodate postprandial hyperglycemia Today discussed in detail the need for mealtime insulin to cover postprandial spikes, action of mealtime insulin, use of the insulin pen, timing and action of the rapid acting insulin as well as starting dose and dosage titration to target the two-hour reading of under  180 He will start with 6 to 8 units of Humalog and discussed that he will adjust this every 3 to 4 days by about 2 units to have blood sugars at target as above  May need to reduce Lantus if overnight sugars start getting lower More regular exercise with walking or other activities He will try to work with his wife to try and improve his diet further  Follow-up in about 2 months  Hypertension: Continue same regimen  Also follow-up with PCP regarding annual exam   There are no Patient Instructions on file for this visit.  Total visit time for evaluation and management and counseling = 30 minutes  Reather Littler 12/16/2022, 10:46 AM

## 2022-12-20 ENCOUNTER — Encounter: Payer: Self-pay | Admitting: Endocrinology

## 2022-12-23 ENCOUNTER — Encounter: Payer: Self-pay | Admitting: Internal Medicine

## 2022-12-29 ENCOUNTER — Other Ambulatory Visit: Payer: Self-pay | Admitting: Endocrinology

## 2022-12-29 DIAGNOSIS — E1165 Type 2 diabetes mellitus with hyperglycemia: Secondary | ICD-10-CM

## 2023-01-18 ENCOUNTER — Encounter: Payer: Self-pay | Admitting: Internal Medicine

## 2023-02-14 ENCOUNTER — Other Ambulatory Visit (INDEPENDENT_AMBULATORY_CARE_PROVIDER_SITE_OTHER): Payer: Medicare Other

## 2023-02-14 DIAGNOSIS — I10 Essential (primary) hypertension: Secondary | ICD-10-CM

## 2023-02-14 DIAGNOSIS — Z794 Long term (current) use of insulin: Secondary | ICD-10-CM | POA: Diagnosis not present

## 2023-02-14 DIAGNOSIS — E1165 Type 2 diabetes mellitus with hyperglycemia: Secondary | ICD-10-CM

## 2023-02-14 LAB — BASIC METABOLIC PANEL
BUN: 12 mg/dL (ref 6–23)
CO2: 27 mEq/L (ref 19–32)
Calcium: 9.7 mg/dL (ref 8.4–10.5)
Chloride: 106 mEq/L (ref 96–112)
Creatinine, Ser: 0.9 mg/dL (ref 0.40–1.50)
GFR: 88.35 mL/min (ref >60.00)
Glucose, Bld: 151 mg/dL — ABNORMAL HIGH (ref 70–99)
Potassium: 4.1 mEq/L (ref 3.5–5.1)
Sodium: 140 mEq/L (ref 135–145)

## 2023-02-15 LAB — FRUCTOSAMINE: Fructosamine: 250 umol/L (ref 0–285)

## 2023-02-17 ENCOUNTER — Encounter: Payer: Self-pay | Admitting: Endocrinology

## 2023-02-17 ENCOUNTER — Ambulatory Visit (INDEPENDENT_AMBULATORY_CARE_PROVIDER_SITE_OTHER): Payer: Medicare Other | Admitting: Endocrinology

## 2023-02-17 VITALS — BP 130/80 | HR 84 | Ht 72.0 in | Wt 295.0 lb

## 2023-02-17 DIAGNOSIS — Z794 Long term (current) use of insulin: Secondary | ICD-10-CM | POA: Diagnosis not present

## 2023-02-17 DIAGNOSIS — I10 Essential (primary) hypertension: Secondary | ICD-10-CM

## 2023-02-17 DIAGNOSIS — E1165 Type 2 diabetes mellitus with hyperglycemia: Secondary | ICD-10-CM | POA: Diagnosis not present

## 2023-02-17 NOTE — Progress Notes (Signed)
Patient ID: Mark White, male   DOB: 07-11-1956, 67 y.o.   MRN: [id number]      Reason for Appointment : Followup for Type 2 Diabetes  History of Present Illness          Diagnosis: Type 2 diabetes mellitus, date of diagnosis:   2013        Past history:  At diagnosis he was having borderline hyperglycemia. He had previously been taking metformin from his PCP. He was prescribed this twice a day but would tend to forget and take it once a day.  His A1c in early 2014 was 7.2   Because of his relatively high A1c of 7.5 and obesity he was started on Trulicity 0.75 mg weekly in 06/2013 Previously in 09/2014 he had made substantial changes in his diet and lost over 20 pounds His A1c with this was also much better than usual at 5.8  Recent history:   Non-insulin hypoglycemic drugs: Metformin ER 750 mg, 2 tablets daily, Amaryl 4 mg at dinner  INSULIN regimen: Lantus 25 units at night, NovoLog 3-4 units as needed  His A1c is 7.5 last but fructosamine is 250   Current control, blood sugar patterns and problems identified:  He was started on NovoLog on his last visit but he misunderstood his instructions and is only taking it as needed for high blood sugars when he is getting a high alert instead of before meals His blood sugars are fairly getting consistently high after supper unless eating a very low carbohydrate meal Also when he is eating larger meals such as sandwiches at lunch he will have a high blood sugar spike also However with current dose of 25 units of Lantus he is having periodic low blood sugars overnight Currently is CGM indicates a GMI of 6.7 Because of the hot weather he has not done any exercise No hypoglycemia during the day However with somewhat better diet especially eating salads at lunch he has lost 3 pounds Apparently his insurance did not cover his Jardiance when he tried this year Using his freestyle libre fairly consistently   Side effects from  medications have been: Fatigue and nausea from Trulicity, malaise from [employer] version 3 download for the last 2 weeks shows the following pattern  Overnight blood sugars are relatively high at midnight averaging about 140 but usually coming down to an average of 99 early morning He has had 4 or 5 episodes of hypoglycemia at different times of the night  Blood sugars are usually rising progressively in the morning and again after about 5 PM POSTPRANDIAL readings are usually significantly higher after dinner After lunch blood sugars are variable and only occasionally higher Blood sugars are not spiking after breakfast but usually showing a dawn phenomenon before 9-10 AM Time in range is somewhat better  CGM use % of time 90  2-week average/GV 143/32  Time in range        76 %  % Time Above 180 19  % Time above 250 2  % Time Below 70 3     PRE-MEAL Fasting Lunch Dinner Bedtime Overall  Glucose range:       Averages: 99       POST-MEAL PC Breakfast PC Lunch PC Dinner  Glucose range:     Averages: 162 153 186   Previously:  CGM use % of time 65  2-week average/GV 164  Time in range      64%  %  Time Above 180 30  % Time above 250 6  % Time Below 70      PRE-MEAL Fasting Lunch Dinner Bedtime Overall  Glucose range:       Averages: 115  150     POST-MEAL PC Breakfast PC Lunch PC Dinner  Glucose range:     Averages: 187  231    Self-care: The diet that the patient has been following is: No specific diet Diet history:  Typical breakfast is an egg,  with bread        Dietician visit: Most recent: 09/2022   Weight history: Usually ranging from 290-305  Wt Readings from Last 3 Encounters:  02/17/23 295 lb (133.8 kg)  12/16/22 298 lb 6.4 oz (135.4 kg)  09/15/22 298 lb (135.2 kg)    Diabetes control:    Lab Results  Component Value Date   HGBA1C 7.5 (H) 12/14/2022   HGBA1C 7.7 (H) 09/12/2022   HGBA1C 7.9 (H) 05/17/2022   Lab Results  Component  Value Date   MICROALBUR 1.0 05/17/2022   LDLCALC 70 05/17/2022   CREATININE 0.90 02/14/2023   Lab Results  Component Value Date   FRUCTOSAMINE 250 02/14/2023   FRUCTOSAMINE 236 11/16/2021   FRUCTOSAMINE 253 07/05/2021     Appointment on 02/14/2023  Component Date Value Ref Range Status   Fructosamine 02/14/2023 250  0 - 285 umol/L Final   Comment: Published reference interval for apparently healthy subjects between age 32 and 31 is 27 - 285 umol/L and in a poorly controlled diabetic population is 228 - 563 umol/L with a mean of 396 umol/L.    Sodium 02/14/2023 140  135 - 145 mEq/L Final   Potassium 02/14/2023 4.1  3.5 - 5.1 mEq/L Final   Chloride 02/14/2023 106  96 - 112 mEq/L Final   CO2 02/14/2023 27  19 - 32 mEq/L Final   Glucose, Bld 02/14/2023 151 (H)  70 - 99 mg/dL Final   BUN 16/05/9603 12  6 - 23 mg/dL Final   Creatinine, Ser 02/14/2023 0.90  0.40 - 1.50 mg/dL Final   GFR 54/04/8118 88.35  >60.00 mL/min Final   Calculated using the CKD-EPI Creatinine Equation (2021)   Calcium 02/14/2023 9.7  8.4 - 10.5 mg/dL Final    Allergies as of 02/17/2023   No Known Allergies      Medication List        Accurate as of February 17, 2023  3:27 PM. If you have any questions, ask your nurse or doctor.          STOP taking these medications    empagliflozin 25 MG Tabs tablet Commonly known as: Jardiance Stopped by: Reather Littler       TAKE these medications    Accu-Chek Guide test strip Generic drug: glucose blood CHECK SUGAR 3 TIMES A DAY   Accu-Chek Guide w/Device Kit 1 each by Does not apply route daily. Use glucose meter to check blood sugar as prescribed.   amLODipine 2.5 MG tablet Commonly known as: NORVASC Take by mouth.   aspirin 81 MG tablet Take 1 tablet (81 mg total) by mouth daily. STOP TAKING AND RESTART Dec 29 2011 What changed: additional instructions   atorvastatin 40 MG tablet Commonly known as: LIPITOR Take 40 mg by mouth daily.   B-D  ULTRAFINE III SHORT PEN 31G X 8 MM Misc Generic drug: Insulin Pen Needle USE TO INJECT INSULIN DAILY   glimepiride 2 MG tablet Commonly known as: AMARYL TAKE 2  TABLETS BY MOUTH DAILY AFTER SUPPER.   Lantus SoloStar 100 UNIT/ML Solostar Pen Generic drug: insulin glargine INJECT UP TO 22 UNITS EVERY MORNING What changed: additional instructions   losartan 100 MG tablet Commonly known as: COZAAR Take by mouth.   metFORMIN 750 MG 24 hr tablet Commonly known as: GLUCOPHAGE-XR TAKE 1 TABLET AT BREAKFAST AND 2 TABLETS AT DINNER What changed: See the new instructions.   NovoLOG FlexPen 100 UNIT/ML FlexPen Generic drug: insulin aspart Inject 12 Units into the skin 3 (three) times daily with meals.   OneTouch Delica Plus Lancet33G Misc USE TO CHECK BLOOD SUGAR ONCE A DAY DX CODE E11.65   Accu-Chek Softclix Lancets lancets CHECK SUGARS 3 TIMES DAILY E11.65        Allergies: No Known Allergies  Past Medical History:  Diagnosis Date   Allergy    Arthritis    knees   Diabetes mellitus without complication (HCC)    Hyperlipidemia    Hypertension    Personal history of adenomatous colonic polyps 01/19/2011   Sleep apnea    not using CPAP   Ulcer    30 years ago    Past Surgical History:  Procedure Laterality Date   COLONOSCOPY  12/14/2011   Procedure: COLONOSCOPY;  Surgeon: Iva Boop, MD;  Location: [facility];  Service: Endoscopy;  Laterality: N/A;  colon wiht APC for follow up of colon polyps   COLONOSCOPY N/A 10/10/2012   Procedure: COLONOSCOPY;  Surgeon: Iva Boop, MD;  Location: [facility];  Service: Endoscopy;  Laterality: N/A;  Need APC   COLONOSCOPY     COLONOSCOPY W/ POLYPECTOMY  01/13/11   3 polyps, largest 3 cm cecal polyp - adenomas   HOT HEMOSTASIS  12/14/2011   Procedure: HOT HEMOSTASIS (ARGON PLASMA COAGULATION/BICAP);  Surgeon: Iva Boop, MD;  Location: [facility];  Service: Endoscopy;  Laterality: N/A;   VASECTOMY     with sedation      Family History  Problem Relation Age of Onset   Colon cancer Mother 51   Heart disease Mother    Heart disease Father    Cancer - Other Daughter        glioblastoma- died age 64    Obesity Neg Hx    Sleep apnea Neg Hx    Stroke Neg Hx    Heart attack Neg Hx    Colon polyps Neg Hx    Rectal cancer Neg Hx    Stomach cancer Neg Hx    Esophageal cancer Neg Hx     Social History:  reports that he has never smoked. He has never used smokeless tobacco. He reports current alcohol use of about 2.0 standard drinks of alcohol per week. He reports that he does not use drugs.    Review of Systems       Lipids: Has been on Lipitor for over 10 years for hypercholesterolemia, also followed by PCP. Triglycerides had been controlled with fenofibrate  LDL is controlled; his HDL has been consistently low    Lab Results  Component Value Date   CHOL 121 05/17/2022   HDL 29.10 (L) 05/17/2022   LDLCALC 70 05/17/2022   LDLDIRECT 72.0 09/06/2021   TRIG 107.0 05/17/2022   CHOLHDL 4 05/17/2022           Has history of sleep apnea, currently untreated   HYPERTENSION: He is on losartan 100 mg and also amlodipine followed by PCP  Blood pressure about 130s systolic at home  BP Readings  from Last 3 Encounters:  02/17/23 130/80  12/16/22 132/72  09/15/22 (!) 144/68    Last foot exam done with PCP   Physical Examination:  BP 130/80 (BP Location: Left Arm, Patient Position: Sitting, Cuff Size: Large)   Pulse 84   Ht 6' (1.829 m)   Wt 295 lb (133.8 kg)   SpO2 97%   BMI 40.01 kg/m   No ankle edema present  ASSESSMENT:  Diabetes type 2, With obesity    See history of present illness for discussion of current diabetes management, blood sugar patterns and problems identified  His A1c is most recently above target at 7.5 but fructosamine is relatively better at 250  He is on basal insulin with 25 units Lantus daily, NovoLog along with metformin and Amaryl He is not able to  afford SGLT2 drugs  His blood sugars overall better but still has postprandial hyperglycemia He did not understand the concept of taking NovoLog proactively before meals and he still has high postprandial readings With somewhat better diet his weight is slightly better This may be also helping his this may be also helping his overnight blood sugars when he is getting somewhat low  HYPERTENSION: Blood pressure is well-controlled    PLAN:  For diabetes:   Again discussed in detail the need for mealtime insulin to cover postprandial spikes, action of mealtime insulin, timing and action of the rapid acting insulin as well as starting dose and dosage titration to target the two-hour reading of under 180  He will need to reduce Lantus down to 22 to prevent overnight sugars from getting lower He will need to restart regular exercise with walking or other activities Make sure he has some protein with every meal He does not need to take NovoLog if eating a low carbohydrate meals but if eating sandwiches or other high carbohydrate meals at lunch he will need to cover his lunch also  Hypertension: No change in medications needed    Patient Instructions  22 Lantus daily  Take 4-6 units Novolog 10 min. before any meals with carbs or high fat  Walk daily      Reather Littler 02/17/2023, 3:27 PM

## 2023-02-17 NOTE — Patient Instructions (Addendum)
22 Lantus daily  Take 4-6 units Novolog 10 min. before any meals with carbs or high fat  Walk daily

## 2023-02-21 ENCOUNTER — Encounter: Payer: Self-pay | Admitting: Endocrinology

## 2023-02-28 ENCOUNTER — Ambulatory Visit: Payer: Medicare Other | Admitting: *Deleted

## 2023-02-28 VITALS — Ht 72.0 in | Wt 290.0 lb

## 2023-02-28 DIAGNOSIS — Z800 Family history of malignant neoplasm of digestive organs: Secondary | ICD-10-CM

## 2023-02-28 DIAGNOSIS — Z86010 Personal history of colonic polyps: Secondary | ICD-10-CM

## 2023-02-28 NOTE — Progress Notes (Signed)
Pt's name and DOB verified at the beginning of the pre-visit.  Pt denies any difficulty with ambulating,sitting, laying down or rolling side to side Gave both [facility] main # and MD on call # prior to instructions.  No egg or soy allergy known to patient  No issues known to pt with past sedation with any surgeries or procedures Patient denies ever being intubated Pt has no issues moving head neck or swallowing No FH of Malignant Hyperthermia Pt is not on diet pills Pt is not on home 02  Pt is not on blood thinners  Pt denies issues with constipation  Pt is not on dialysis Pt denise any abnormal heart rhythms  Pt denies any upcoming cardiac testing Pt encouraged to use to use Singlecare or Goodrx to reduce cost  Patient's chart reviewed by Cathlyn Parsons CNRA prior to pre-visit and patient appropriate for the [facility].  Pre-visit completed and red dot placed by patient's name on their procedure day (on provider's schedule).  . Visit by phone Pt states weight is 290 lb Instructed pt why it is important to and  to call if they have any changes in health or new medications. Directed them to the # given and on instructions.   Pt states they will.  Instructions reviewed with pt and pt states understanding. Instructed to review again prior to procedure. Pt states they will.  Instructions sent by mail with coupon and by my chart

## 2023-03-21 ENCOUNTER — Encounter: Payer: Self-pay | Admitting: Internal Medicine

## 2023-03-21 ENCOUNTER — Ambulatory Visit (AMBULATORY_SURGERY_CENTER): Payer: Medicare Other | Admitting: Internal Medicine

## 2023-03-21 VITALS — BP 123/63 | HR 51 | Temp 98.4°F | Resp 12 | Ht 72.0 in | Wt 290.0 lb

## 2023-03-21 DIAGNOSIS — Z09 Encounter for follow-up examination after completed treatment for conditions other than malignant neoplasm: Secondary | ICD-10-CM | POA: Diagnosis not present

## 2023-03-21 DIAGNOSIS — D123 Benign neoplasm of transverse colon: Secondary | ICD-10-CM

## 2023-03-21 DIAGNOSIS — D120 Benign neoplasm of cecum: Secondary | ICD-10-CM | POA: Diagnosis not present

## 2023-03-21 DIAGNOSIS — Z86010 Personal history of colonic polyps: Secondary | ICD-10-CM | POA: Diagnosis not present

## 2023-03-21 DIAGNOSIS — Z800 Family history of malignant neoplasm of digestive organs: Secondary | ICD-10-CM

## 2023-03-21 HISTORY — PX: COLONOSCOPY W/ POLYPECTOMY: SHX1380

## 2023-03-21 MED ORDER — SODIUM CHLORIDE 0.9 % IV SOLN: 500.0000 mL | Freq: Once | INTRAVENOUS | Status: DC

## 2023-03-21 NOTE — Progress Notes (Signed)
Sedate, gd SR, tolerated procedure well, VSS, report to RN 

## 2023-03-21 NOTE — Progress Notes (Signed)
Called to room to assist during endoscopic procedure.  Patient ID and intended procedure confirmed with present staff. Received instructions for my participation in the procedure from the performing physician.  

## 2023-03-21 NOTE — Patient Instructions (Addendum)
I found and removed 6 small polyps - I will let you know pathology results and when to have another routine colonoscopy by mail and/or My Chart.  I appreciate the opportunity to care for you. Iva Boop, MD, FACG   YOU HAD AN ENDOSCOPIC PROCEDURE TODAY AT THE [facility]:   Refer to the procedure report that was given to you for any specific questions about what was found during the examination.  If the procedure report does not answer your questions, please call your gastroenterologist to clarify.  If you requested that your care partner not be given the details of your procedure findings, then the procedure report has been included in a sealed envelope for you to review at your convenience later.  YOU SHOULD EXPECT: Some feelings of bloating in the abdomen. Passage of more gas than usual.  Walking can help get rid of the air that was put into your GI tract during the procedure and reduce the bloating. If you had a lower endoscopy (such as a colonoscopy or flexible sigmoidoscopy) you may notice spotting of blood in your stool or on the toilet paper. If you underwent a bowel prep for your procedure, you may not have a normal bowel movement for a few days.  Please Note:  You might notice some irritation and congestion in your nose or some drainage.  This is from the oxygen used during your procedure.  There is no need for concern and it should clear up in a day or so.  SYMPTOMS TO REPORT IMMEDIATELY:  Following lower endoscopy (colonoscopy or flexible sigmoidoscopy):  Excessive amounts of blood in the stool  Significant tenderness or worsening of abdominal pains  Swelling of the abdomen that is new, acute  Fever of 100F or higher  For urgent or emergent issues, a gastroenterologist can be reached at any hour by calling [phone]. Do not use MyChart messaging for urgent concerns.    DIET:  We do recommend a small meal at first, but then you may proceed to your regular  diet.  Drink plenty of fluids but you should avoid alcoholic beverages for 24 hours.  ACTIVITY:  You should plan to take it easy for the rest of today and you should NOT DRIVE or use heavy machinery until tomorrow (because of the sedation medicines used during the test).    FOLLOW UP: Our staff will call the number listed on your records the next business day following your procedure.  We will call around 7:15- 8:00 am to check on you and address any questions or concerns that you may have regarding the information given to you following your procedure. If we do not reach you, we will leave a message.     If any biopsies were taken you will be contacted by phone or by letter within the next 1-3 weeks.  Please call us at [phone] if you have not heard about the biopsies in 3 weeks.    SIGNATURES/CONFIDENTIALITY: You and/or your care partner have signed paperwork which will be entered into your electronic medical record.  These signatures attest to the fact that that the information above on your After Visit Summary has been reviewed and is understood.  Full responsibility of the confidentiality of this discharge information lies with you and/or your care-partner.

## 2023-03-21 NOTE — Progress Notes (Signed)
[facility] History and Physical   Primary Care Physician:  Eartha Inch, MD   Reason for Procedure:   Hx polyps + FHx CRCA  Plan:    colonoscopy     HPI: Mark White is a 67 y.o. male for polyp surveillance + FHx CRCA mom at 61   Multiple, with one 3 cm right colon polyp All adenomas 2012 11/2011 - residual cecal adenoma and 2 other adenomas anticipate repeat colonoscopy by 06/2012 10/10/2012 diminutive transverse polyp removed - not recovered 07/2016 3 subcm adenomas  10/2019 5 adenomas recall 2024   Past Surgical History:  Procedure Laterality Date   COLONOSCOPY  12/14/2011   Procedure: COLONOSCOPY;  Surgeon: Iva Boop, MD;  Location: [facility];  Service: Endoscopy;  Laterality: N/A;  colon wiht APC for follow up of colon polyps   COLONOSCOPY N/A 10/10/2012   Procedure: COLONOSCOPY;  Surgeon: Iva Boop, MD;  Location: [facility];  Service: Endoscopy;  Laterality: N/A;  Need APC   COLONOSCOPY     COLONOSCOPY W/ POLYPECTOMY  01/13/11   3 polyps, largest 3 cm cecal polyp - adenomas   HOT HEMOSTASIS  12/14/2011   Procedure: HOT HEMOSTASIS (ARGON PLASMA COAGULATION/BICAP);  Surgeon: Iva Boop, MD;  Location: [facility];  Service: Endoscopy;  Laterality: N/A;   VASECTOMY     with sedation     Prior to Admission medications   Medication Sig Start Date End Date Taking? Authorizing Provider  ACCU-CHEK GUIDE test strip CHECK SUGAR 3 TIMES A DAY 09/08/22  Yes Reather Littler, MD  Accu-Chek Softclix Lancets lancets CHECK SUGARS 3 TIMES DAILY E11.65 05/10/21  Yes Reather Littler, MD  amLODipine (NORVASC) 2.5 MG tablet Take by mouth. 08/19/20  Yes [provider]  aspirin 81 MG tablet Take 1 tablet (81 mg total) by mouth daily. STOP TAKING AND RESTART Dec 29 2011 Patient taking differently: Take 81 mg by mouth daily. 12/14/11  Yes Iva Boop, MD  atorvastatin (LIPITOR) 40 MG tablet Take 40 mg by mouth daily.   Yes [provider]   Blood Glucose Monitoring Suppl (ACCU-CHEK GUIDE) w/Device KIT 1 each by Does not apply route daily. Use glucose meter to check blood sugar as prescribed. 09/07/18  Yes Reather Littler, MD  glimepiride (AMARYL) 2 MG tablet TAKE 2 TABLETS BY MOUTH DAILY AFTER SUPPER. 11/06/22  Yes Reather Littler, MD  insulin aspart (NOVOLOG FLEXPEN) 100 UNIT/ML FlexPen Inject 12 Units into the skin 3 (three) times daily with meals. 12/16/22  Yes Reather Littler, MD  insulin glargine (LANTUS SOLOSTAR) 100 UNIT/ML Solostar Pen INJECT UP TO 22 UNITS EVERY MORNING Patient taking differently: INJECT UP TO 25 UNITS EVERY MORNING 12/29/22  Yes Reather Littler, MD  Insulin Pen Needle (B-D ULTRAFINE III SHORT PEN) 31G X 8 MM MISC USE TO INJECT INSULIN DAILY 12/16/22  Yes Reather Littler, MD  Lancets [facility] DELICA PLUS [place]) MISC USE TO CHECK BLOOD SUGAR ONCE A DAY DX CODE E11.65 06/10/19  Yes Reather Littler, MD  losartan (COZAAR) 100 MG tablet Take by mouth. 03/27/17  Yes [provider]  metFORMIN (GLUCOPHAGE-XR) 750 MG 24 hr tablet TAKE 1 TABLET AT BREAKFAST AND 2 TABLETS AT DINNER Patient taking differently: 2 TABLETS AT [facility] 09/12/22  Yes Reather Littler, MD    Current Outpatient Medications  Medication Sig Dispense Refill   ACCU-CHEK GUIDE test strip CHECK SUGAR 3 TIMES A DAY 100 strip 3   Accu-Chek Softclix Lancets lancets CHECK SUGARS 3 TIMES DAILY  E11.65 100 each 1   amLODipine (NORVASC) 2.5 MG tablet Take by mouth.     aspirin 81 MG tablet Take 1 tablet (81 mg total) by mouth daily. STOP TAKING AND RESTART Dec 29 2011 (Patient taking differently: Take 81 mg by mouth daily.) 30 tablet    atorvastatin (LIPITOR) 40 MG tablet Take 40 mg by mouth daily.     Blood Glucose Monitoring Suppl (ACCU-CHEK GUIDE) w/Device KIT 1 each by Does not apply route daily. Use glucose meter to check blood sugar as prescribed. 1 kit 0   glimepiride (AMARYL) 2 MG tablet TAKE 2 TABLETS BY MOUTH DAILY AFTER SUPPER. 180 tablet 1   insulin aspart (NOVOLOG  FLEXPEN) 100 UNIT/ML FlexPen Inject 12 Units into the skin 3 (three) times daily with meals. 15 mL 11   insulin glargine (LANTUS SOLOSTAR) 100 UNIT/ML Solostar Pen INJECT UP TO 22 UNITS EVERY MORNING (Patient taking differently: INJECT UP TO 25 UNITS EVERY MORNING) 15 mL 1   Insulin Pen Needle (B-D ULTRAFINE III SHORT PEN) 31G X 8 MM MISC USE TO INJECT INSULIN DAILY 100 each 3   Lancets (ONETOUCH DELICA PLUS LANCET33G) MISC USE TO CHECK BLOOD SUGAR ONCE A DAY DX CODE E11.65 100 each 12   losartan (COZAAR) 100 MG tablet Take by mouth.     metFORMIN (GLUCOPHAGE-XR) 750 MG 24 hr tablet TAKE 1 TABLET AT BREAKFAST AND 2 TABLETS AT DINNER (Patient taking differently: 2 TABLETS AT DINNER) 270 tablet 3   Current Facility-Administered Medications  Medication Dose Route Frequency Provider Last Rate Last Admin   0.9 %  sodium chloride infusion  500 mL Intravenous Once Iva Boop, MD        Allergies as of 03/21/2023   (No Known Allergies)    Family History  Problem Relation Age of Onset   Colon cancer Mother 29   Heart disease Mother    Heart disease Father    Cancer - Other Daughter        glioblastoma- died age 38    Obesity Neg Hx    Sleep apnea Neg Hx    Stroke Neg Hx    Heart attack Neg Hx    Colon polyps Neg Hx    Rectal cancer Neg Hx    Stomach cancer Neg Hx    Esophageal cancer Neg Hx     Social History   Socioeconomic History   Marital status: Married    Spouse name: Not on file   Number of children: Not on file   Years of education: Not on file   Highest education level: Not on file  Occupational History   Not on file  Tobacco Use   Smoking status: Never   Smokeless tobacco: Never  Vaping Use   Vaping status: Never Used  Substance and Sexual Activity   Alcohol use: Yes    Alcohol/week: 2.0 standard drinks of alcohol    Types: 2 Cans of beer per week    Comment: rarely 4x a month/beer   Drug use: No   Sexual activity: Not on file  Other Topics Concern   Not  on file  Social History Narrative   Not on file   Social Determinants of Health   Financial Resource Strain: Low Risk  (12/20/2022)   Received from [facility], [facility]   Overall Financial Resource Strain (CARDIA)    Difficulty of Paying Living Expenses: Not hard at all  Food Insecurity: No Food Insecurity (12/20/2022)   [facility]   Hunger Vital Sign    Worried About Running Out of Food in the Last Year: Never true    Ran Out of Food in the Last Year: Never true  Transportation Needs: No Transportation Needs (12/20/2022)   [facility], [facility]   PRAPARE - Transportation    Lack of Transportation (Medical): No    Lack of Transportation (Non-Medical): No  Physical Activity: Insufficiently Active (12/15/2021)   Received from [facility], [facility]   Exercise Vital Sign    Days of Exercise per Week: 2 days    Minutes of Exercise per Session: 30 min  Stress: No Stress Concern Present (12/15/2021)   Received from [facility], [facility] of Occupational Health - Occupational Stress Questionnaire    Feeling of Stress : Not at all  Social Connections: Unknown (12/17/2022)   Received from [facility], [facility]   Social Network    Social Network: Not on file  Intimate Partner Violence: Unknown (12/17/2022)   Received from [facility], [facility]   HITS    Physically Hurt: Not on file    Insult or Talk Down To: Not on file    Threaten Physical Harm: Not on file    Scream or Curse: Not on file    Review of Systems:  All other review of systems negative except as mentioned in the HPI.  Physical Exam: Vital signs BP 127/71   Pulse 62   Temp 98.4 F (36.9 C)   Ht 6' (1.829 m)   Wt 290 lb (131.5 kg)   SpO2 98%   BMI 39.33 kg/m   General:   Alert,  Well-developed, well-nourished, pleasant and cooperative in NAD Lungs:  Clear throughout to auscultation.   Heart:  Regular rate  and rhythm; no murmurs, clicks, rubs,  or gallops. Abdomen:  Soft, nontender and nondistended. Normal bowel sounds.  Small soft umbilical hernia Neuro/Psych:  Alert and cooperative. Normal mood and affect. A and O x 3   @Husna Krone  Sena Slate, MD, [facility] [phone] (pager) 03/21/2023 8:37 AM@

## 2023-03-21 NOTE — Op Note (Signed)
[facility] Patient Name: Mark White Procedure Date: 03/21/2023 8:31 AM MRN: [id number] Endoscopist: Iva Boop , MD, [id number] Age: 67 Referring MD:  Date of Birth: [birth year] Gender: Male Account #: [id number] Procedure:                Colonoscopy Indications:              Surveillance: Personal history of adenomatous                            polyps on last colonoscopy 3 years ago, Last                            colonoscopy: April 2021 FHx CRCA mother age 81 Medicines:                Monitored Anesthesia Care Procedure:                Pre-Anesthesia Assessment:                           - Prior to the procedure, a History and Physical                            was performed, and patient medications and                            allergies were reviewed. The patient's tolerance of                            previous anesthesia was also reviewed. The risks                            and benefits of the procedure and the sedation                            options and risks were discussed with the patient.                            All questions were answered, and informed consent                            was obtained. Prior Anticoagulants: The patient has                            taken no anticoagulant or antiplatelet agents. ASA                            Grade Assessment: III - A patient with severe                            systemic disease. After reviewing the risks and                            benefits, the patient was deemed in satisfactory  condition to undergo the procedure.                           After obtaining informed consent, the colonoscope                            was passed under direct vision. Throughout the                            procedure, the patient's blood pressure, pulse, and                            oxygen saturations were monitored continuously. The                            CF [id number] #[id number]  was introduced through the anus                            and advanced to the the cecum, identified by                            appendiceal orifice and ileocecal valve. The                            colonoscopy was performed without difficulty. The                            patient tolerated the procedure well. The quality                            of the bowel preparation was good. The ileocecal                            valve, appendiceal orifice, and rectum were                            photographed. The bowel preparation used was                            Miralax via split dose instruction. Scope In: 8:49:31 AM Scope Out: 9:12:20 AM Scope Withdrawal Time: 0 hours 20 minutes 1 second  Total Procedure Duration: 0 hours 22 minutes 49 seconds  Findings:                 The perianal and digital rectal examinations were                            normal. Pertinent negatives include normal prostate                            (size, shape, and consistency).                           Five sessile polyps were found in the transverse  colon. The polyps were diminutive in size. These                            polyps were removed with a cold snare. Resection                            and retrieval were complete. Verification of                            patient identification for the specimen was done.                            Estimated blood loss was minimal.                           A 1 mm polyp was found in the cecum. The polyp was                            sessile. The polyp was removed with a cold biopsy                            forceps. Resection and retrieval were complete.                            Verification of patient identification for the                            specimen was done. Estimated blood loss was minimal.                           Multiple diverticula were found in the sigmoid                            colon.                            The exam was otherwise without abnormality on                            direct and retroflexion views. Complications:            No immediate complications. Estimated Blood Loss:     Estimated blood loss was minimal. Impression:               - Five diminutive polyps in the transverse colon,                            removed with a cold snare. Resected and retrieved.                           - One 1 mm polyp in the cecum, removed with a cold                            biopsy forceps. Resected and retrieved.                           -  Diverticulosis in the sigmoid colon.                           - The examination was otherwise normal on direct                            and retroflexion views.                           - Personal history of colonic polyps. Multiple,                            with one 3 cm right colon polyp                           All adenomas 2012                           11/2011 - residual cecal adenoma and 2 other                            adenomas anticipate repeat colonoscopy by 06/2012                           10/10/2012 diminutive transverse polyp removed - not                            recovered                           07/2016 3 subcm adenomas                           10/2019 5 adenomas recall                           Also FHx CRCA mother age 67 Recommendation:           - Patient has a contact number available for                            emergencies. The signs and symptoms of potential                            delayed complications were discussed with the                            patient. Return to normal activities tomorrow.                            Written discharge instructions were provided to the                            patient.                           - Resume previous diet.                           -  Continue present medications.                           - Repeat colonoscopy is recommended for                             surveillance. The colonoscopy date will be                            determined after pathology results from today's                            exam become available for review. USE ABDOMINAL                            BINDER AGAIN TO OVERCOME LOOPING Iva Boop, MD 03/21/2023 9:21:15 AM This report has been signed electronically.

## 2023-03-21 NOTE — Progress Notes (Signed)
Pt's states no medical or surgical changes since previsit or office visit. 

## 2023-03-22 ENCOUNTER — Telehealth: Payer: Self-pay | Admitting: *Deleted

## 2023-03-22 NOTE — Telephone Encounter (Signed)
  Follow up Call-     03/21/2023    8:20 AM  Call back number  Post procedure Call Back phone  # [phone]  Permission to leave phone message Yes     Patient questions:  Do you have a fever, pain , or abdominal swelling? No. Pain Score  0 *  Have you tolerated food without any problems? Yes.    Have you been able to return to your normal activities? Yes.    Do you have any questions about your discharge instructions: Diet   No. Medications  No. Follow up visit  No.  Do you have questions or concerns about your Care? No.  Actions: * If pain score is 4 or above: No action needed, pain <4.

## 2023-03-23 ENCOUNTER — Encounter: Payer: Self-pay | Admitting: Internal Medicine

## 2023-05-24 ENCOUNTER — Telehealth: Payer: Self-pay

## 2023-05-24 ENCOUNTER — Other Ambulatory Visit (INDEPENDENT_AMBULATORY_CARE_PROVIDER_SITE_OTHER): Payer: Medicare Other

## 2023-05-24 ENCOUNTER — Other Ambulatory Visit: Payer: Self-pay

## 2023-05-24 DIAGNOSIS — E1165 Type 2 diabetes mellitus with hyperglycemia: Secondary | ICD-10-CM

## 2023-05-24 DIAGNOSIS — Z794 Long term (current) use of insulin: Secondary | ICD-10-CM

## 2023-05-24 LAB — BASIC METABOLIC PANEL
BUN: 13 mg/dL (ref 6–23)
CO2: 25 meq/L (ref 19–32)
Calcium: 9.2 mg/dL (ref 8.4–10.5)
Chloride: 108 meq/L (ref 96–112)
Creatinine, Ser: 0.94 mg/dL (ref 0.40–1.50)
GFR: 83.7 mL/min (ref >60.00)
Glucose, Bld: 161 mg/dL — ABNORMAL HIGH (ref 70–99)
Potassium: 4.1 meq/L (ref 3.5–5.1)
Sodium: 141 meq/L (ref 135–145)

## 2023-05-24 LAB — HEMOGLOBIN A1C: Hgb A1c MFr Bld: 7.8 % — ABNORMAL HIGH (ref 4.6–6.5)

## 2023-05-29 ENCOUNTER — Encounter: Payer: Self-pay | Admitting: Endocrinology

## 2023-05-29 ENCOUNTER — Ambulatory Visit (INDEPENDENT_AMBULATORY_CARE_PROVIDER_SITE_OTHER): Payer: Medicare Other | Admitting: Endocrinology

## 2023-05-29 VITALS — BP 132/70 | HR 65 | Resp 18 | Ht 72.0 in | Wt 304.0 lb

## 2023-05-29 DIAGNOSIS — Z794 Long term (current) use of insulin: Secondary | ICD-10-CM | POA: Diagnosis not present

## 2023-05-29 DIAGNOSIS — E1165 Type 2 diabetes mellitus with hyperglycemia: Secondary | ICD-10-CM

## 2023-05-29 LAB — MICROALBUMIN / CREATININE URINE RATIO
Creatinine,U: 133.9 mg/dL
Microalb Creat Ratio: 0.5 mg/g (ref 0.0–30.0)
Microalb, Ur: <0.7 mg/dL (ref 0.0–1.9)

## 2023-05-29 NOTE — Progress Notes (Signed)
Outpatient Endocrinology Note Mark Francille Wittmann, MD  05/29/23  Patient's Name: Mark White    DOB: 1956/04/19    MRN: [id number]                                                    REASON OF VISIT: Follow up of type 2 diabetes mellitus  PCP: Eartha Inch, MD  HISTORY OF PRESENT ILLNESS:   Mark White is a 67 y.o. old male with past medical history listed below, is here for follow up for type 2 diabetes mellitus.   Pertinent Diabetes History: Patient was diagnosed with type 2 diabetes mellitus in 2013.  He has uncontrolled type 2 diabetes mellitus.  Chronic Diabetes Complications : Retinopathy: No. Last ophthalmology exam was done on annually., following with ophthalmology regularly.  Nephropathy: no, on ACE/ARB  Peripheral neuropathy: no Coronary artery disease: no Stroke: no  Relevant comorbidities and cardiovascular risk factors: Obesity: yes Body mass index is 41.23 kg/m.  Hypertension: Yes  Hyperlipidemia : Yes, on statin   Current / Home Diabetic regimen includes: Lantus 22 units daily in the morning. Glimepiride 4 mg daily. Metformin extended release 750 mg 2 tablets daily. NovoLog as needed with meals.  Prior diabetic medications: Trulicity and Ozempic tried in the past had fatigue and nausea does not like to take anymore, cost was issue too. He is not able to afford SGLT2 drugs.  Glycemic data:    CONTINUOUS GLUCOSE MONITORING SYSTEM (CGMS) INTERPRETATION: At today's visit, we reviewed CGM downloads. The full report is scanned in the media. Reviewing the CGM trends, blood glucose are as follows:  -CGM data available for May 24, 2023, 25.  Overnight and early morning blood sugar acceptable in the range of 120-160.  Postprandial hyperglycemia with blood sugar up to 270 range.  No hypoglycemia.  On October 20 5 in the evening after supper time had blood sugar in 50s and 60s however related to sensor failure/sensor issue.  Hypoglycemia: Patient has no  hypoglycemic episodes. Patient has hypoglycemia awareness.  Factors modifying glucose control: 1.  Diabetic diet assessment: 3 meals, lunch is sometime more of salad.   2.  Staying active or exercising:   3.  Medication compliance: compliant most of the time.  Interval history  CGM data as reviewed above.  Diabetes regimen as noted above.  Recent hemoglobin A1c 7.8%.  Recently he had sensor issue with her libre 3 and does not have enough glucose data.  Denies hypoglycemic symptoms.  No other complaints today.  REVIEW OF SYSTEMS As per history of present illness.   PAST MEDICAL HISTORY: Past Medical History:  Diagnosis Date   Allergy    Arthritis    knees   Depression    Diabetes mellitus without complication (HCC)    Hyperlipidemia    Hypertension    Personal history of adenomatous colonic polyps 01/19/2011   Sleep apnea    not using CPAP   Ulcer    30 years ago    PAST SURGICAL HISTORY: Past Surgical History:  Procedure Laterality Date   COLONOSCOPY  12/14/2011   Procedure: COLONOSCOPY;  Surgeon: Iva Boop, MD;  Location: [facility];  Service: Endoscopy;  Laterality: N/A;  colon wiht APC for follow up of colon polyps   COLONOSCOPY N/A 10/10/2012   Procedure: COLONOSCOPY;  Surgeon: Maryjean Morn  Leone Payor, MD;  Location: [facility];  Service: Endoscopy;  Laterality: N/A;  Need APC   COLONOSCOPY  2021   CG   COLONOSCOPY W/ POLYPECTOMY  01/13/2011   3 polyps, largest 3 cm cecal polyp - adenomas   COLONOSCOPY W/ POLYPECTOMY  03/21/2023   [facility]   HOT HEMOSTASIS  12/14/2011   Procedure: HOT HEMOSTASIS (ARGON PLASMA COAGULATION/BICAP);  Surgeon: Iva Boop, MD;  Location: [facility];  Service: Endoscopy;  Laterality: N/A;   VASECTOMY     with sedation     ALLERGIES: No Known Allergies  FAMILY HISTORY:  Family History  Problem Relation Age of Onset   Colon cancer Mother 14   Heart disease Mother    Heart disease Father    Cancer - Other  Daughter        glioblastoma- died age 33    Obesity Neg Hx    Sleep apnea Neg Hx    Stroke Neg Hx    Heart attack Neg Hx    Colon polyps Neg Hx    Rectal cancer Neg Hx    Stomach cancer Neg Hx    Esophageal cancer Neg Hx     SOCIAL HISTORY: Social History   Socioeconomic History   Marital status: Married    Spouse name: Not on file   Number of children: Not on file   Years of education: Not on file   Highest education level: Not on file  Occupational History   Not on file  Tobacco Use   Smoking status: Never   Smokeless tobacco: Never  Vaping Use   Vaping status: Never Used  Substance and Sexual Activity   Alcohol use: Yes    Alcohol/week: 2.0 standard drinks of alcohol    Types: 2 Cans of beer per week    Comment: rarely 4x a month/beer   Drug use: No   Sexual activity: Not on file  Other Topics Concern   Not on file  Social History Narrative   Not on file   Social Determinants of Health   Financial Resource Strain: Low Risk  (12/20/2022)   Received from [facility]   Overall Financial Resource Strain (CARDIA)    Difficulty of Paying Living Expenses: Not hard at all  Food Insecurity: No Food Insecurity (12/20/2022)   Received from [facility], [facility]   Hunger Vital Sign    Worried About Running Out of Food in the Last Year: Never true    Ran Out of Food in the Last Year: Never true  Transportation Needs: No Transportation Needs (12/20/2022)   Received from [facility], [facility]   PRAPARE - Transportation    Lack of Transportation (Medical): No    Lack of Transportation (Non-Medical): No  Physical Activity: Insufficiently Active (12/15/2021)   Received from [facility], [facility]   Exercise Vital Sign    Days of Exercise per Week: 2 days    Minutes of Exercise per Session: 30 min  Stress: No Stress Concern Present (12/15/2021)   Received from [facility], [facility] -  Occupational Stress Questionnaire    Feeling of Stress : Not at all  Social Connections: Unknown (12/17/2022)   Received [facility], [facility]   Social Network    Social Network: Not on file    MEDICATIONS:  Current Outpatient Medications  Medication Sig Dispense Refill   ACCU-CHEK GUIDE test strip CHECK SUGAR 3 TIMES A  DAY 100 strip 3   Accu-Chek Softclix Lancets lancets CHECK SUGARS 3 TIMES DAILY E11.65 100 each 1   amLODipine (NORVASC) 2.5 MG tablet Take by mouth.     aspirin 81 MG tablet Take 1 tablet (81 mg total) by mouth daily. STOP TAKING AND RESTART Dec 29 2011 (Patient taking differently: Take 81 mg by mouth daily.) 30 tablet    atorvastatin (LIPITOR) 40 MG tablet Take 40 mg by mouth daily.     Blood Glucose Monitoring Suppl (ACCU-CHEK GUIDE) w/Device KIT 1 each by Does not apply route daily. Use glucose meter to check blood sugar as prescribed. 1 kit 0   glimepiride (AMARYL) 2 MG tablet TAKE 2 TABLETS BY MOUTH DAILY AFTER SUPPER. 180 tablet 1   insulin aspart (NOVOLOG FLEXPEN) 100 UNIT/ML FlexPen Inject 12 Units into the skin 3 (three) times daily with meals. 15 mL 11   insulin glargine (LANTUS SOLOSTAR) 100 UNIT/ML Solostar Pen INJECT UP TO 22 UNITS EVERY MORNING (Patient taking differently: INJECT UP TO 25 UNITS EVERY MORNING) 15 mL 1   Insulin Pen Needle (B-D ULTRAFINE III SHORT PEN) 31G X 8 MM MISC USE TO INJECT INSULIN DAILY 100 each 3   Lancets (ONETOUCH DELICA PLUS LANCET33G) MISC USE TO CHECK BLOOD SUGAR ONCE A DAY DX CODE E11.65 100 each 12   losartan (COZAAR) 100 MG tablet Take by mouth.     metFORMIN (GLUCOPHAGE-XR) 750 MG 24 hr tablet TAKE 1 TABLET AT BREAKFAST AND 2 TABLETS AT DINNER (Patient taking differently: 2 TABLETS AT DINNER) 270 tablet 3   No current facility-administered medications for this visit.    PHYSICAL EXAM: Vitals:   05/29/23 1028  BP: 132/70  Pulse: 65  Resp: 18  SpO2: 96%  Weight: (!) 304 lb (137.9 kg)  Height: 6' (1.829 m)    Body mass index is 41.23 kg/m.  Wt Readings from Last 3 Encounters:  05/29/23 (!) 304 lb (137.9 kg)  03/21/23 290 lb (131.5 kg)  02/28/23 290 lb (131.5 kg)    General: Well developed, well nourished male in no apparent distress.  HEENT: AT/[place], no external lesions.  Eyes: Conjunctiva clear and no icterus. Neck: Neck supple  Lungs: Respirations not labored Neurologic: Alert, oriented, normal speech Extremities / Skin: Dry. No sores or rashes noted.  Psychiatric: Does not appear depressed or anxious  Diabetic Foot Exam - Simple   No data filed    LABS Reviewed Lab Results  Component Value Date   HGBA1C 7.8 (H) 05/24/2023   HGBA1C 7.5 (H) 12/14/2022   HGBA1C 7.7 (H) 09/12/2022   Lab Results  Component Value Date   FRUCTOSAMINE 250 02/14/2023   FRUCTOSAMINE 236 11/16/2021   FRUCTOSAMINE 253 07/05/2021   Lab Results  Component Value Date   CHOL 121 05/17/2022   HDL 29.10 (L) 05/17/2022   LDLCALC 70 05/17/2022   LDLDIRECT 72.0 09/06/2021   TRIG 107.0 05/17/2022   CHOLHDL 4 05/17/2022   Lab Results  Component Value Date   MICRALBCREAT 0.5 05/17/2022   MICRALBCREAT 0.5 07/05/2021   Lab Results  Component Value Date   CREATININE 0.94 05/24/2023   Lab Results  Component Value Date   GFR 83.70 05/24/2023    ASSESSMENT / PLAN  1. Uncontrolled type 2 diabetes mellitus with hyperglycemia, with long-term current use of insulin (HCC)     Diabetes Mellitus type 2, complicated by no known complications. - Diabetic status / severity: Uncontrolled.  Lab Results  Component Value Date   HGBA1C 7.8 (  H) 05/24/2023    - Hemoglobin A1c goal : <7%  - Medications: See below.  Diabetes regimen: Lantus decrease to 20 units daily. Novolog 4-6 units with meals 3 times a day, as long as glucose > 100, you can still take before eating.  Metfromin XR 750 mg 3 tabs daily. Take with food.  Glimepiride 4mg  daily. Take with food.   - Home glucose testing: Libre 3 and check as  needed. - Discussed/ Gave Hypoglycemia treatment plan.  # Consult : not required at this time.   # Annual urine for microalbuminuria/ creatinine ratio, no microalbuminuria currently, continue ACE/ARB /losartan.  Will check microalbumin creatinine ratio urine today. Last  Lab Results  Component Value Date   MICRALBCREAT 0.5 05/17/2022    # Foot check nightly / neuropathy.  # Annual dilated diabetic eye exams.   - Diet: Make healthy diabetic food choices - Life style / activity / exercise: Discussed.  2. Blood pressure  -  BP Readings from Last 1 Encounters:  05/29/23 132/70    - Control is in target.  - No change in current plans.  3. Lipid status / Hyperlipidemia - Last  Lab Results  Component Value Date   LDLCALC 70 05/17/2022   - Continue atorvastatin 40 mg daily.  Managed by primary care provider.  Diagnoses and all orders for this visit:  Uncontrolled type 2 diabetes mellitus with hyperglycemia, with long-term current use of insulin (HCC) -     Microalbumin / creatinine urine ratio; Future -     Microalbumin / creatinine urine ratio    DISPOSITION Follow up in clinic in 3 months suggested.  Same-day lab.   All questions answered and patient verbalized understanding of the plan.  Mark Toren Tucholski, MD [facility] Endocrinology [facility] [address] [place], [address] [place], [place] [address] Phone # [phone]  At least part of this note was generated using voice recognition software. Inadvertent word errors may have occurred, which were not recognized during the proofreading process.

## 2023-05-29 NOTE — Patient Instructions (Signed)
Diabetes regimen: Lantus decrease to 20 units daily. Novolog 4-6 units with meals 3 times a day, as long as glucose > 100, you can still take before eating.  Metfromin XR 750 mg 3 tabs daily. Take with food.  Glimepiride 4mg  daily. Take with food.

## 2023-06-06 NOTE — Telephone Encounter (Signed)
done

## 2023-07-03 ENCOUNTER — Other Ambulatory Visit: Payer: Self-pay

## 2023-07-03 ENCOUNTER — Telehealth: Payer: Self-pay

## 2023-07-03 DIAGNOSIS — E1165 Type 2 diabetes mellitus with hyperglycemia: Secondary | ICD-10-CM

## 2023-07-03 MED ORDER — LANTUS SOLOSTAR 100 UNIT/ML ~~LOC~~ SOPN
PEN_INJECTOR | SUBCUTANEOUS | 1 refills | Status: DC
Start: 2023-07-03 — End: 2023-08-29

## 2023-07-03 NOTE — Telephone Encounter (Signed)
Lantus refill request complete patient made aware via mychart

## 2023-07-24 ENCOUNTER — Other Ambulatory Visit: Payer: Self-pay

## 2023-07-24 DIAGNOSIS — I10 Essential (primary) hypertension: Secondary | ICD-10-CM

## 2023-07-24 MED ORDER — BD PEN NEEDLE SHORT U/F 31G X 8 MM MISC
3 refills | Status: DC
Start: 2023-07-24 — End: 2023-08-29

## 2023-08-04 ENCOUNTER — Encounter: Payer: Self-pay | Admitting: Endocrinology

## 2023-08-04 ENCOUNTER — Other Ambulatory Visit: Payer: Self-pay

## 2023-08-04 DIAGNOSIS — E1165 Type 2 diabetes mellitus with hyperglycemia: Secondary | ICD-10-CM

## 2023-08-04 MED ORDER — GLIMEPIRIDE 2 MG PO TABS: ORAL_TABLET | ORAL | 1 refills | Status: DC

## 2023-08-07 ENCOUNTER — Other Ambulatory Visit: Payer: Self-pay

## 2023-08-29 ENCOUNTER — Encounter: Payer: Self-pay | Admitting: Endocrinology

## 2023-08-29 ENCOUNTER — Ambulatory Visit (INDEPENDENT_AMBULATORY_CARE_PROVIDER_SITE_OTHER): Payer: Medicare Other | Admitting: Endocrinology

## 2023-08-29 VITALS — BP 138/60 | HR 86 | Resp 20 | Ht 72.0 in | Wt 302.6 lb

## 2023-08-29 DIAGNOSIS — Z794 Long term (current) use of insulin: Secondary | ICD-10-CM

## 2023-08-29 DIAGNOSIS — E1165 Type 2 diabetes mellitus with hyperglycemia: Secondary | ICD-10-CM | POA: Diagnosis not present

## 2023-08-29 LAB — POCT GLYCOSYLATED HEMOGLOBIN (HGB A1C): Hemoglobin A1C: 8.4 % — AB (ref 4.0–5.6)

## 2023-08-29 MED ORDER — GLIMEPIRIDE 2 MG PO TABS: ORAL_TABLET | ORAL | 3 refills | Status: DC

## 2023-08-29 MED ORDER — LANTUS SOLOSTAR 100 UNIT/ML ~~LOC~~ SOPN: 18.0000 [IU] | PEN_INJECTOR | Freq: Every day | SUBCUTANEOUS | 3 refills | Status: DC

## 2023-08-29 MED ORDER — METFORMIN HCL ER 750 MG PO TB24: ORAL_TABLET | ORAL | 3 refills | Status: DC

## 2023-08-29 MED ORDER — NOVOLOG FLEXPEN 100 UNIT/ML ~~LOC~~ SOPN: PEN_INJECTOR | SUBCUTANEOUS | 11 refills | Status: DC

## 2023-08-29 MED ORDER — INSULIN PEN NEEDLE 32G X 4 MM MISC: 3 refills | Status: AC

## 2023-08-29 NOTE — Progress Notes (Signed)
Outpatient Endocrinology Note [place] Jackey Housey, MD  08/29/23  Patient's Name: Mark White    DOB: February 11, 1956    MRN: [id number]                                                    REASON OF VISIT: Follow up of type 2 diabetes mellitus  PCP: Eartha Inch, MD  HISTORY OF PRESENT ILLNESS:   Mark White is a 68 y.o. old male with past medical history listed below, is here for follow up for type 2 diabetes mellitus.   Pertinent Diabetes History: Patient was diagnosed with type 2 diabetes mellitus in 2013.  He has uncontrolled type 2 diabetes mellitus.  Chronic Diabetes Complications : Retinopathy: No. Last ophthalmology exam was done on annually., following with ophthalmology regularly.  Nephropathy: no, on ACE/ARB  Peripheral neuropathy: no Coronary artery disease: no Stroke: no  Relevant comorbidities and cardiovascular risk factors: Obesity: yes Body mass index is 41.04 kg/m.  Hypertension: Yes  Hyperlipidemia : Yes, on statin   Current / Home Diabetic regimen includes: Lantus 20 units daily in the morning. Glimepiride 4 mg daily. Metformin extended release 750 mg 3 tablets daily. NovoLog 5 - 7 units with breakfast as needed with other meals.  Prior diabetic medications: Trulicity and Ozempic tried in the past had fatigue and nausea does not like to take anymore, cost was issue too. He is not able to afford SGLT2 drugs.  Glycemic data:    CONTINUOUS GLUCOSE MONITORING SYSTEM (CGMS) INTERPRETATION: At today's visit, we reviewed CGM downloads. The full report is scanned in the media. Reviewing the CGM trends, blood glucose are as follows:  GMI 8%.  Sensor usage 96%.    Interpretation: Frequent hyperglycemia with blood sugar up to 300 range especially with supper, occasionally with lunch.  Overnight blood sugar mostly acceptable.  Tends to have hypoglycemia overnight if the blood sugar at bedtime is normal.  Trending down blood sugar overnight.  No significant  hypoglycemia.  He has been taking NovoLog after eating occasionally with lunch and supper.  Hypoglycemia: Patient has no hypoglycemic episodes. Patient has hypoglycemia awareness.  Factors modifying glucose control: 1.  Diabetic diet assessment: 3 meals, lunch is sometime more of salad.  Dinner is the largest meal.  2.  Staying active or exercising: Walking at [employer].  3.  Medication compliance: compliant most of the time.  Interval history  CGM data as reviewed above.  Postprandial hyperglycemia.  Hemoglobin A1c today 8.4% worsening.  Diabetes regimen as noted above.  He started to take NovoLog more often with lunch and supper.  No other complaints today.  REVIEW OF SYSTEMS As per history of present illness.   PAST MEDICAL HISTORY: Past Medical History:  Diagnosis Date   Allergy    Arthritis    knees   Depression    Diabetes mellitus without complication (HCC)    Hyperlipidemia    Hypertension    Personal history of adenomatous colonic polyps 01/19/2011   Sleep apnea    not using CPAP   Ulcer    30 years ago    PAST SURGICAL HISTORY: Past Surgical History:  Procedure Laterality Date   COLONOSCOPY  12/14/2011   Procedure: COLONOSCOPY;  Surgeon: Iva Boop, MD;  Location: [facility];  Service: Endoscopy;  Laterality: N/A;  colon wiht APC  for follow up of colon polyps   COLONOSCOPY N/A 10/10/2012   Procedure: COLONOSCOPY;  Surgeon: Iva Boop, MD;  Location: [facility];  Service: Endoscopy;  Laterality: N/A;  Need APC   COLONOSCOPY  2021   CG   COLONOSCOPY W/ POLYPECTOMY  01/13/2011   3 polyps, largest 3 cm cecal polyp - adenomas   COLONOSCOPY W/ POLYPECTOMY  03/21/2023   [facility]   HOT HEMOSTASIS  12/14/2011   Procedure: HOT HEMOSTASIS (ARGON PLASMA COAGULATION/BICAP);  Surgeon: Iva Boop, MD;  Location: [facility];  Service: Endoscopy;  Laterality: N/A;   VASECTOMY     with sedation     ALLERGIES: No Known Allergies  FAMILY HISTORY:   Family History  Problem Relation Age of Onset   Colon cancer Mother 82   Heart disease Mother    Heart disease Father    Cancer - Other Daughter        glioblastoma- died age 50    Obesity Neg Hx    Sleep apnea Neg Hx    Stroke Neg Hx    Heart attack Neg Hx    Colon polyps Neg Hx    Rectal cancer Neg Hx    Stomach cancer Neg Hx    Esophageal cancer Neg Hx     SOCIAL HISTORY: Social History   Socioeconomic History   Marital status: Married    Spouse name: Not on file   Number of children: Not on file   Years of education: Not on file   Highest education level: Not on file  Occupational History   Not on file  Tobacco Use   Smoking status: Never   Smokeless tobacco: Never  Vaping Use   Vaping status: Never Used  Substance and Sexual Activity   Alcohol use: Yes    Alcohol/week: 2.0 standard drinks of alcohol    Types: 2 Cans of beer per week    Comment: rarely 4x a month/beer   Drug use: No   Sexual activity: Not on file  Other Topics Concern   Not on file  Social History Narrative   Not on file   Social Drivers of Health   Financial Resource Strain: Low Risk  (08/27/2023)   Received from [facility]   Overall Financial Resource Strain (CARDIA)    Difficulty of Paying Living Expenses: Not hard at all  Food Insecurity: No Food Insecurity (08/27/2023)   Received from [facility]   Hunger Vital Sign    Worried About Running Out of Food in the Last Year: Never true    Ran Out of Food in the Last Year: Never true  Transportation Needs: No Transportation Needs (08/27/2023)   Received from [facility] - Transportation    Lack of Transportation (Medical): No    Lack of Transportation (Non-Medical): No  Physical Activity: Insufficiently Active (08/27/2023)   Received from [facility]   Exercise Vital Sign    Days of Exercise per Week: 2 days    Minutes of Exercise per Session: 30 min  Stress: No Stress Concern Present (08/27/2023)   Received from  [facility] - Occupational Stress Questionnaire    Feeling of Stress : Not at all  Social Connections: Moderately Integrated (08/27/2023)   Received from [facility]   Social Network    How would you rate your social network (family, work, friends)?: Adequate participation with social networks    MEDICATIONS:  Current  Outpatient Medications  Medication Sig Dispense Refill   ACCU-CHEK GUIDE test strip CHECK SUGAR 3 TIMES A DAY 100 strip 3   Accu-Chek Softclix Lancets lancets CHECK SUGARS 3 TIMES DAILY E11.65 100 each 1   amLODipine (NORVASC) 2.5 MG tablet Take by mouth.     aspirin 81 MG tablet Take 1 tablet (81 mg total) by mouth daily. STOP TAKING AND RESTART Dec 29 2011 (Patient taking differently: Take 81 mg by mouth daily.) 30 tablet    atorvastatin (LIPITOR) 40 MG tablet Take 40 mg by mouth daily.     Blood Glucose Monitoring Suppl (ACCU-CHEK GUIDE) w/Device KIT 1 each by Does not apply route daily. Use glucose meter to check blood sugar as prescribed. 1 kit 0   Insulin Pen Needle 32G X 4 MM MISC Use 4x a day 300 each 3   Lancets (ONETOUCH DELICA PLUS LANCET33G) MISC USE TO CHECK BLOOD SUGAR ONCE A DAY DX CODE E11.65 100 each 12   losartan (COZAAR) 100 MG tablet Take by mouth.     glimepiride (AMARYL) 2 MG tablet TAKE 2 TABLETS BY MOUTH DAILY AFTER SUPPER. 180 tablet 3   insulin aspart (NOVOLOG FLEXPEN) 100 UNIT/ML FlexPen Novolog 7 units with breakfast, 7 units lunch and 10 with supper 3 times a day. 15 mL 11   insulin glargine (LANTUS SOLOSTAR) 100 UNIT/ML Solostar Pen Inject 18 Units into the skin daily. INJECT UP TO 25 UNITS EVERY MORNING 15 mL 3   metFORMIN (GLUCOPHAGE-XR) 750 MG 24 hr tablet TAKE 1 TABLET AT BREAKFAST AND 2 TABLETS AT DINNER 270 tablet 3   No current facility-administered medications for this visit.    PHYSICAL EXAM: Vitals:   08/29/23 1028  BP: 138/60  Pulse: 86  Resp: 20  SpO2: 97%  Weight: (!) 302 lb 9.6  oz (137.3 kg)  Height: 6' (1.829 m)    Body mass index is 41.04 kg/m.  Wt Readings from Last 3 Encounters:  08/29/23 (!) 302 lb 9.6 oz (137.3 kg)  05/29/23 (!) 304 lb (137.9 kg)  03/21/23 290 lb (131.5 kg)    General: Well developed, well nourished male in no apparent distress.  HEENT: AT/[place], no external lesions.  Eyes: Conjunctiva clear and no icterus. Neck: Neck supple  Lungs: Respirations not labored Neurologic: Alert, oriented, normal speech Extremities / Skin: Dry. No sores or rashes noted.  Psychiatric: Does not appear depressed or anxious  Diabetic Foot Exam - Simple   Simple Foot Form Diabetic Foot exam was performed with the following findings: Yes 08/29/2023 10:46 AM  Visual Inspection No deformities, no ulcerations, no other skin breakdown bilaterally: Yes Sensation Testing Intact to touch and monofilament testing bilaterally: Yes Pulse Check Posterior Tibialis and Dorsalis pulse intact bilaterally: Yes Comments    LABS Reviewed Lab Results  Component Value Date   HGBA1C 8.4 (A) 08/29/2023   HGBA1C 7.8 (H) 05/24/2023   HGBA1C 7.5 (H) 12/14/2022   Lab Results  Component Value Date   FRUCTOSAMINE 250 02/14/2023   FRUCTOSAMINE 236 11/16/2021   FRUCTOSAMINE 253 07/05/2021   Lab Results  Component Value Date   CHOL 121 05/17/2022   HDL 29.10 (L) 05/17/2022   LDLCALC 70 05/17/2022   LDLDIRECT 72.0 09/06/2021   TRIG 107.0 05/17/2022   CHOLHDL 4 05/17/2022   Lab Results  Component Value Date   MICRALBCREAT 0.5 05/29/2023   MICRALBCREAT 0.5 05/17/2022   Lab Results  Component Value Date   CREATININE 0.94 05/24/2023   Lab Results  Component  Value Date   GFR 83.70 05/24/2023    ASSESSMENT / PLAN  1. Uncontrolled type 2 diabetes mellitus with hyperglycemia, with long-term current use of insulin (HCC)   2. Uncontrolled type 2 diabetes mellitus with hyperglycemia, without long-term current use of insulin (HCC)      Diabetes Mellitus type 2,  complicated by no known complications. - Diabetic status / severity: Uncontrolled.  Lab Results  Component Value Date   HGBA1C 8.4 (A) 08/29/2023    - Hemoglobin A1c goal : <7%  Mostly postprandial hyperglycemia.  Adjusted diabetes regimen as follows.  Discussed about starting Mounjaro, he had GI intolerance with Ozempic and Trulicity in the past, hesitant to go back on GLP-1 receptor agonist.  He would like to try with diet since and exercise to lose weight before trying Mounjaro in the future visit.  - Medications: See below.  Diabetes regimen: Lantus decrease from 20 to 18 units daily. Novolog 7 units with breakfast, 7 units lunch and 10 with supper 3 times a day, as long as glucose > 100, you can still take before eating.  Metfromin XR 750 mg 3 tabs daily. Take with food.  Glimepiride 4mg  daily. Take with food.   - Home glucose testing: Libre 3 and check as needed. - Discussed/ Gave Hypoglycemia treatment plan.  # Consult : not required at this time.   # Annual urine for microalbuminuria/ creatinine ratio, no microalbuminuria currently, continue ACE/ARB /losartan.   Last  Lab Results  Component Value Date   MICRALBCREAT 0.5 05/29/2023    # Foot check nightly / neuropathy.  # Annual dilated diabetic eye exams.   - Diet: Make healthy diabetic food choices - Life style / activity / exercise: Discussed.  2. Blood pressure  -  BP Readings from Last 1 Encounters:  08/29/23 138/60    - Control is in target.  - No change in current plans.  3. Lipid status / Hyperlipidemia - Last  Lab Results  Component Value Date   LDLCALC 70 05/17/2022   - Continue atorvastatin 40 mg daily.  Managed by primary care provider.  Diagnoses and all orders for this visit:  Uncontrolled type 2 diabetes mellitus with hyperglycemia, with long-term current use of insulin (HCC) -     POCT glycosylated hemoglobin (Hb A1C) -     insulin aspart (NOVOLOG FLEXPEN) 100 UNIT/ML FlexPen; Novolog  7 units with breakfast, 7 units lunch and 10 with supper 3 times a day. -     metFORMIN (GLUCOPHAGE-XR) 750 MG 24 hr tablet; TAKE 1 TABLET AT BREAKFAST AND 2 TABLETS AT DINNER  Uncontrolled type 2 diabetes mellitus with hyperglycemia, without long-term current use of insulin (HCC) -     glimepiride (AMARYL) 2 MG tablet; TAKE 2 TABLETS BY MOUTH DAILY AFTER SUPPER. -     Insulin Pen Needle 32G X 4 MM MISC; Use 4x a day -     insulin glargine (LANTUS SOLOSTAR) 100 UNIT/ML Solostar Pen; Inject 18 Units into the skin daily. INJECT UP TO 25 UNITS EVERY MORNING    DISPOSITION Follow up in clinic in 3 months suggested.     All questions answered and patient verbalized understanding of the plan.  [place] Mckynzie Liwanag, MD [facility] Endocrinology [facility] [address] [place], [address] [place], [place] [address] Phone # [phone]  At least part of this note was generated using voice recognition software. Inadvertent word errors may have occurred, which were not recognized during the proofreading process.

## 2023-08-29 NOTE — Patient Instructions (Addendum)
Diabetes regimen: Lantus decrease to 18 units daily. Novolog 7 units with breakfast, 7 units lunch and 10 with supper 3 times a day, as long as glucose > 100, you can still take before eating.  Metfromin XR 750 mg 3 tabs daily. Take with food.  Glimepiride 4mg  daily. Take with food.    Latest Reference Range & Units 09/12/22 10:21 12/14/22 10:19 05/24/23 10:26 08/29/23 10:30  Hemoglobin A1C 4.0 - 5.6 % -  7.7 (H) 7.5 (H) 7.8 (H) 8.4 ! Pend  (H): Data is abnormally high !: Data is abnormal

## 2023-08-30 ENCOUNTER — Encounter: Payer: Self-pay | Admitting: Endocrinology

## 2023-10-27 ENCOUNTER — Other Ambulatory Visit: Payer: Self-pay | Admitting: Endocrinology

## 2023-10-27 DIAGNOSIS — E1165 Type 2 diabetes mellitus with hyperglycemia: Secondary | ICD-10-CM

## 2023-11-29 ENCOUNTER — Encounter: Payer: Self-pay | Admitting: Endocrinology

## 2023-11-29 ENCOUNTER — Ambulatory Visit (INDEPENDENT_AMBULATORY_CARE_PROVIDER_SITE_OTHER): Payer: Medicare Other | Admitting: Endocrinology

## 2023-11-29 ENCOUNTER — Telehealth: Payer: Self-pay

## 2023-11-29 VITALS — BP 140/70 | HR 66 | Resp 20 | Ht 72.0 in | Wt 300.2 lb

## 2023-11-29 DIAGNOSIS — E1165 Type 2 diabetes mellitus with hyperglycemia: Secondary | ICD-10-CM

## 2023-11-29 DIAGNOSIS — Z794 Long term (current) use of insulin: Secondary | ICD-10-CM

## 2023-11-29 LAB — POCT GLYCOSYLATED HEMOGLOBIN (HGB A1C): Hemoglobin A1C: 8.1 % — AB (ref 4.0–5.6)

## 2023-11-29 MED ORDER — FREESTYLE LIBRE 3 PLUS SENSOR MISC: 1.0000 | 3 refills | Status: AC

## 2023-11-29 NOTE — Patient Instructions (Signed)
 Latest Reference Range & Units 05/24/23 10:26 08/29/23 10:30 11/29/23 10:38  Hemoglobin A1C 4.0 - 5.6 % 7.8 (H) 8.4 ! Pend 8.1 !  (H): Data is abnormally high !: Data is abnormal

## 2023-11-29 NOTE — Telephone Encounter (Signed)
 Freestyle libre 3+ ordred through Copy with [employer] as requested by patient

## 2023-11-29 NOTE — Progress Notes (Signed)
 Outpatient Endocrinology Note Mark Shraddha Lebron, MD  11/29/23  Patient's Name: Mark White    DOB: October 20, 1955    MRN: [id number]                                                    REASON OF VISIT: Follow up of type 2 diabetes mellitus  PCP: Emaline Handsome, MD  HISTORY OF PRESENT ILLNESS:   Mark White is a 68 y.o. old male with past medical history listed below, is here for follow up for type 2 diabetes mellitus.   Pertinent Diabetes History: Patient was diagnosed with type 2 diabetes mellitus in 2013.  He has uncontrolled type 2 diabetes mellitus.  Chronic Diabetes Complications : Retinopathy: No. Last ophthalmology exam was done on annually., following with ophthalmology regularly.  Nephropathy: no, on ACE/ARB  Peripheral neuropathy: no Coronary artery disease: no Stroke: no  Relevant comorbidities and cardiovascular risk factors: Obesity: yes Body mass index is 40.71 kg/m.  Hypertension: Yes  Hyperlipidemia : Yes, on statin   Current / Home Diabetic regimen includes: Lantus  18 units daily in the morning. Glimepiride  4 mg daily. Metformin  extended release 750 mg 3 tablets daily. NovoLog  5 - 7 units with breakfast as needed with other meals.  Generally not taking with lunch and supper.  Prior diabetic medications: Trulicity  and Ozempic  tried in the past had fatigue and nausea does not like to take anymore, cost was issue too. He is not able to afford SGLT2 drugs.  Glycemic data:    CONTINUOUS GLUCOSE MONITORING SYSTEM (CGMS) INTERPRETATION: At today's visit, we reviewed CGM downloads. The full report is scanned in the media. Reviewing the CGM trends, blood glucose are as follows:  Date April 5 to November 17, 2023, 14 days  GMI 7.8%.  Sensor usage 58%.     Interpretation: Frequent hyperglycemia mainly with lunch with blood sugar of 250-300 range and also with supper /bedtime with blood sugar up to 300 range.  Blood sugar overnight and in between the meals and  around morning time/breakfast time acceptable.  No hypoglycemia.  Hypoglycemia: Patient has no hypoglycemic episodes. Patient has hypoglycemia awareness.  Factors modifying glucose control: 1.  Diabetic diet assessment: 3 meals, lunch is sometime more of salad.  Dinner is the largest meal.  2.  Staying active or exercising: Walking at [employer].  3.  Medication compliance: compliant most of the time.  Interval history  M data as reviewed above.  Mostly hyperglycemia with lunch and supper/bedtime.  He has not been taking with lunch and supper.  Hemoglobin A1c slightly improved to 8.1%.  He has no other complaints today.  REVIEW OF SYSTEMS As per history of present illness.   PAST MEDICAL HISTORY: Past Medical History:  Diagnosis Date   Allergy    Arthritis    knees   Depression    Diabetes mellitus without complication (HCC)    Hyperlipidemia    Hypertension    Personal history of adenomatous colonic polyps 01/19/2011   Sleep apnea    not using CPAP   Ulcer    30 years ago    PAST SURGICAL HISTORY: Past Surgical History:  Procedure Laterality Date   COLONOSCOPY  12/14/2011   Procedure: COLONOSCOPY;  Surgeon: Kenney Peacemaker, MD;  Location: [facility];  Service: Endoscopy;  Laterality: N/A;  colon wiht  APC for follow up of colon polyps   COLONOSCOPY N/A 10/10/2012   Procedure: COLONOSCOPY;  Surgeon: Kenney Peacemaker, MD;  Location: [facility];  Service: Endoscopy;  Laterality: N/A;  Need APC   COLONOSCOPY  2021   CG   COLONOSCOPY W/ POLYPECTOMY  01/13/2011   3 polyps, largest 3 cm cecal polyp - adenomas   COLONOSCOPY W/ POLYPECTOMY  03/21/2023   L[facility] Ruff at [facility]   HOT HEMOSTASIS  12/14/2011   Procedure: HOT HEMOSTASIS (ARGON PLASMA COAGULATION/BICAP);  Surgeon: Kenney Peacemaker, MD;  Location: [facility];  Service: Endoscopy;  Laterality: N/A;   VASECTOMY     with sedation     ALLERGIES: No Known Allergies  FAMILY HISTORY:  Family History  Problem Relation Age  of Onset   Colon cancer Mother 40   Heart disease Mother    Heart disease Father    Cancer - Other Daughter        glioblastoma- died age 26    Obesity Neg Hx    Sleep apnea Neg Hx    Stroke Neg Hx    Heart attack Neg Hx    Colon polyps Neg Hx    Rectal cancer Neg Hx    Stomach cancer Neg Hx    Esophageal cancer Neg Hx     SOCIAL HISTORY: Social History   Socioeconomic History   Marital status: Married    Spouse name: Not on file   Number of children: Not on file   Years of education: Not on file   Highest education level: Not on file  Occupational History   Not on file  Tobacco Use   Smoking status: Never   Smokeless tobacco: Never  Vaping Use   Vaping status: Never Used  Substance and Sexual Activity   Alcohol use: Yes    Alcohol/week: 2.0 standard drinks of alcohol    Types: 2 Cans of beer per week    Comment: rarely 4x a month/beer   Drug use: No   Sexual activity: Not on file  Other Topics Concern   Not on file  Social History Narrative   Not on file   Social Drivers of Health   Financial Resource Strain: Low Risk  (08/27/2023)   [facility]   Overall Financial Resource Strain (CARDIA)    Difficulty of Paying Living Expenses: Not hard at all  Food Insecurity: No Food Insecurity (08/27/2023)   Received from [facility]   Hunger Vital Sign    Worried About Running Out of Food in the Last Year: Never true    Ran Out of Food in the Last Year: Never true  Transportation Needs: No Transportation Needs (08/27/2023)   Received [facility] - Transportation    Lack of Transportation (Medical): No    Lack of Transportation (Non-Medical): No  Physical Activity: Insufficiently Active (08/27/2023)   Received from [facility]   Exercise Vital Sign    Days of Exercise per Week: 2 days    Minutes of Exercise per Session: 30 min  Stress: No Stress Concern Present (08/27/2023)   Received from [facility] of  Occupational Health - Occupational Stress Questionnaire    Feeling of Stress : Not at all  Social Connections: Moderately Integrated (08/27/2023)   Received from [facility]   Social Network    How would you rate your social network (family, work, friends)?: Adequate participation with social networks    MEDICATIONS:  Current Outpatient Medications  Medication Sig Dispense Refill   Continuous Glucose Sensor (FREESTYLE LIBRE 3 PLUS SENSOR) MISC 1 each by Does not apply route continuous. Change every 15 days. 6 each 3   ACCU-CHEK GUIDE test strip CHECK SUGAR 3 TIMES A DAY 100 strip 3   Accu-Chek Softclix Lancets lancets CHECK SUGARS 3 TIMES DAILY E11.65 100 each 1   amLODipine (NORVASC) 2.5 MG tablet Take by mouth.     aspirin  81 MG tablet Take 1 tablet (81 mg total) by mouth daily. STOP TAKING AND RESTART Dec 29 2011 (Patient taking differently: Take 81 mg by mouth daily.) 30 tablet    atorvastatin (LIPITOR) 40 MG tablet Take 40 mg by mouth daily.     Blood Glucose Monitoring Suppl (ACCU-CHEK GUIDE) w/Device KIT 1 each by Does not apply route daily. Use glucose meter to check blood sugar as prescribed. 1 kit 0   glimepiride  (AMARYL ) 2 MG tablet TAKE 2 TABLETS BY MOUTH DAILY AFTER SUPPER. 180 tablet 3   insulin  aspart (NOVOLOG  FLEXPEN) 100 UNIT/ML FlexPen Novolog  7 units with breakfast, 7 units lunch and 10 with supper 3 times a day. 15 mL 11   Insulin  Glargine (BASAGLAR KWIKPEN) 100 UNIT/ML Inject 18 Units into the skin daily. 15 mL 3   Insulin  Pen Needle 32G X 4 MM MISC Use 4x a day 300 each 3   Lancets (ONETOUCH DELICA PLUS LANCET33G) MISC USE TO CHECK BLOOD SUGAR ONCE A DAY DX CODE E11.65 100 each 12   losartan (COZAAR) 100 MG tablet Take by mouth.     metFORMIN  (GLUCOPHAGE -XR) 750 MG 24 hr tablet TAKE 1 TABLET AT BREAKFAST AND 2 TABLETS AT DINNER (Patient taking differently: Patient takes 3 tabs at dinner) 270 tablet 3   No current facility-administered medications for this visit.     PHYSICAL EXAM: Vitals:   11/29/23 1034 11/29/23 1035  BP: (!) 142/62 (!) 140/70  Pulse: 66   Resp: 20   SpO2: 96%   Weight: (!) 300 lb 3.2 oz (136.2 kg)   Height: 6' (1.829 m)      Body mass index is 40.71 kg/m.  Wt Readings from Last 3 Encounters:  11/29/23 (!) 300 lb 3.2 oz (136.2 kg)  08/29/23 (!) 302 lb 9.6 oz (137.3 kg)  05/29/23 (!) 304 lb (137.9 kg)    General: Well developed, well nourished male in no apparent distress.  HEENT: AT/[place], no external lesions.  Eyes: Conjunctiva clear and no icterus. Neck: Neck supple  Lungs: Respirations not labored Neurologic: Alert, oriented, normal speech Extremities / Skin: Dry.   Psychiatric: Does not appear depressed or anxious  Diabetic Foot Exam - Simple   No data filed    LABS Reviewed Lab Results  Component Value Date   HGBA1C 8.1 (A) 11/29/2023   HGBA1C 8.4 (A) 08/29/2023   HGBA1C 7.8 (H) 05/24/2023   Lab Results  Component Value Date   FRUCTOSAMINE 250 02/14/2023   FRUCTOSAMINE 236 11/16/2021   FRUCTOSAMINE 253 07/05/2021   Lab Results  Component Value Date   CHOL 121 05/17/2022   HDL 29.10 (L) 05/17/2022   LDLCALC 70 05/17/2022   LDLDIRECT 72.0 09/06/2021   TRIG 107.0 05/17/2022   CHOLHDL 4 05/17/2022   Lab Results  Component Value Date   MICRALBCREAT 0.5 05/29/2023   MICRALBCREAT 0.5 05/17/2022   Lab Results  Component Value Date   CREATININE 0.94 05/24/2023   Lab Results  Component Value Date   GFR 83.70 05/24/2023  ASSESSMENT / PLAN  1. Uncontrolled type 2 diabetes mellitus with hyperglycemia, with long-term current use of insulin  (HCC)      Diabetes Mellitus type 2, complicated by no known complications. - Diabetic status / severity: Uncontrolled.  Lab Results  Component Value Date   HGBA1C 8.1 (A) 11/29/2023    - Hemoglobin A1c goal : <7%  Mostly postprandial hyperglycemia.  Adjusted diabetes regimen as follows.  Discussed about starting Mounjaro, he had GI intolerance  with Ozempic  and Trulicity  in the past, hesitant to go back on GLP-1 receptor agonist.  He would like to try with diet since and exercise to lose weight before trying Mounjaro in the future visit.  Advised to take NovoLog  with lunch and supper as well.  Having hyperglycemia at lunch and supper to cause uncontrolled diabetes.  - Medications: See below.  Diabetes regimen: Continue Lantus  18 units daily.   Novolog  7 units with breakfast with meals 3 times a day.   Metfromin XR 750 mg 3 tabs daily. Take with food.  Glimepiride  4mg  daily. Take with food.   - Home glucose testing: Libre 3 and check as needed.  Sent prescription for freestyle libre 3+. - Discussed/ Gave Hypoglycemia treatment plan.  # Consult : not required at this time.   # Annual urine for microalbuminuria/ creatinine ratio, no microalbuminuria currently, continue ACE/ARB /losartan.   Last  Lab Results  Component Value Date   MICRALBCREAT 0.5 05/29/2023    # Foot check nightly / neuropathy.  # Annual dilated diabetic eye exams.   - Diet: Make healthy diabetic food choices - Life style / activity / exercise: Discussed.  2. Blood pressure  -  BP Readings from Last 1 Encounters:  11/29/23 (!) 140/70    - Control is in target.  - No change in current plans.  3. Lipid status / Hyperlipidemia - Last  Lab Results  Component Value Date   LDLCALC 70 05/17/2022   - Continue atorvastatin 40 mg daily.  Managed by primary care provider.  Diagnoses and all orders for this visit:  Uncontrolled type 2 diabetes mellitus with hyperglycemia, with long-term current use of insulin  (HCC) -     POCT glycosylated hemoglobin (Hb A1C) -     Continuous Glucose Sensor (FREESTYLE LIBRE 3 PLUS SENSOR) MISC; 1 each by Does not apply route continuous. Change every 15 days.    DISPOSITION Follow up in clinic in 3 months suggested.     All questions answered and patient verbalized understanding of the plan.  Mark Alexandera Kuntzman,  MD [facility] Endocrinology [facility] [address] [place], [address] [place], [place] [address] Phone # [phone]  At least part of this note was generated using voice recognition software. Inadvertent word errors may have occurred, which were not recognized during the proofreading process.

## 2024-02-16 ENCOUNTER — Encounter: Payer: Self-pay | Admitting: Advanced Practice Midwife

## 2024-03-19 ENCOUNTER — Ambulatory Visit: Admitting: Endocrinology

## 2024-05-16 ENCOUNTER — Ambulatory Visit (INDEPENDENT_AMBULATORY_CARE_PROVIDER_SITE_OTHER): Admitting: Endocrinology

## 2024-05-16 ENCOUNTER — Other Ambulatory Visit

## 2024-05-16 ENCOUNTER — Ambulatory Visit: Payer: Self-pay | Admitting: Endocrinology

## 2024-05-16 ENCOUNTER — Encounter: Payer: Self-pay | Admitting: Endocrinology

## 2024-05-16 VITALS — BP 118/70 | HR 68 | Resp 16 | Ht 72.0 in | Wt 296.4 lb

## 2024-05-16 DIAGNOSIS — Z7984 Long term (current) use of oral hypoglycemic drugs: Secondary | ICD-10-CM

## 2024-05-16 DIAGNOSIS — E785 Hyperlipidemia, unspecified: Secondary | ICD-10-CM | POA: Diagnosis not present

## 2024-05-16 DIAGNOSIS — Z794 Long term (current) use of insulin: Secondary | ICD-10-CM

## 2024-05-16 DIAGNOSIS — E1165 Type 2 diabetes mellitus with hyperglycemia: Secondary | ICD-10-CM

## 2024-05-16 LAB — POCT GLYCOSYLATED HEMOGLOBIN (HGB A1C): Hemoglobin A1C: 8.2 % — AB (ref 4.0–5.6)

## 2024-05-16 NOTE — Progress Notes (Signed)
 Outpatient Endocrinology Note [place] Uniqua Kihn, MD  05/16/24  Patient's Name: Mark White    DOB: 01-21-1956    MRN: [id number]                                                    REASON OF VISIT: Follow up of type 2 diabetes mellitus  PCP: Sophronia Ozell BROCKS, MD  HISTORY OF PRESENT ILLNESS:   Mark White is a 68 y.o. old male with past medical history listed below, is here for follow up for type 2 diabetes mellitus.   Pertinent Diabetes History: Patient was diagnosed with type 2 diabetes mellitus in 2013.  He has uncontrolled type 2 diabetes mellitus.  Chronic Diabetes Complications : Retinopathy: No. Last ophthalmology exam was done on annually., following with ophthalmology regularly.  Nephropathy: no, on ACE/ARB  Peripheral neuropathy: no Coronary artery disease: no Stroke: no  Relevant comorbidities and cardiovascular risk factors: Obesity: yes Body mass index is 40.2 kg/m.  Hypertension: Yes  Hyperlipidemia : Yes, on statin   Current / Home Diabetic regimen includes: Lantus  16 units daily in the morning. Glimepiride  4 mg daily. Metformin  extended release 750 mg 3 tablets daily. NovoLog  5 units with breakfast as needed with other meals.  Generally not taking with lunch and supper.  Prior diabetic medications: Trulicity  and Ozempic  tried in the past had fatigue and nausea does not like to take anymore, cost was issue too. He is not able to afford SGLT2 drugs.  Glycemic data:    CONTINUOUS GLUCOSE MONITORING SYSTEM (CGMS) INTERPRETATION: At today's visit, we reviewed CGM downloads. The full report is scanned in the media. Reviewing the CGM trends, blood glucose are as follows:  Date: October 3 to May 16, 2024, 14 days       Interpretation: Mostly acceptable blood sugar with occasional hyperglycemia with blood sugar 250-280 range postprandially with lunch and supper.  Blood sugar overnight and in the morning mostly acceptable.  No  hypoglycemia.  Hypoglycemia: Patient has no hypoglycemic episodes. Patient has hypoglycemia awareness.  Factors modifying glucose control: 1.  Diabetic diet assessment: 3 meals, lunch is sometime more of salad.  Dinner is the largest meal.  2.  Staying active or exercising: Walking at [employer]. 2.2 miles / 5 days a week.   3.  Medication compliance: compliant most of the time.  Interval history  CGM data as reviewed above.  Diabetes regimen as reviewed above.  She reports she is actually taking Basaglar 16 units daily and NovoLog  5 units mostly with breakfast only and missing mostly with lunch and supper.  He has been taking glimepiride  at bedtime.  Hemoglobin A1c about the same 8.2%.  No other complaints today.  REVIEW OF SYSTEMS As per history of present illness.   PAST MEDICAL HISTORY: Past Medical History:  Diagnosis Date   Allergy    Arthritis    knees   Depression    Diabetes mellitus without complication (HCC)    Hyperlipidemia    Hypertension    Personal history of adenomatous colonic polyps 01/19/2011   Sleep apnea    not using CPAP   Ulcer    30 years ago    PAST SURGICAL HISTORY: Past Surgical History:  Procedure Laterality Date   COLONOSCOPY  12/14/2011   Procedure: COLONOSCOPY;  Surgeon: Lupita FORBES Commander, MD;  Location:  [facility];  Service: Endoscopy;  Laterality: N/A;  colon wiht APC for follow up of colon polyps   COLONOSCOPY N/A 10/10/2012   Procedure: COLONOSCOPY;  Surgeon: Lupita FORBES Commander, MD;  Location: [facility];  Service: Endoscopy;  Laterality: N/A;  Need APC   COLONOSCOPY  2021   CG   COLONOSCOPY W/ POLYPECTOMY  01/13/2011   3 polyps, largest 3 cm cecal polyp - adenomas   COLONOSCOPY W/ POLYPECTOMY  03/21/2023   [facility]upita Commander at [facility]   HOT HEMOSTASIS  12/14/2011   Procedure: HOT HEMOSTASIS (ARGON PLASMA COAGULATION/BICAP);  Surgeon: Lupita FORBES Commander, MD;  Location: [facility];  Service: Endoscopy;  Laterality: N/A;   VASECTOMY     with sedation      ALLERGIES: No Known Allergies  FAMILY HISTORY:  Family History  Problem Relation Age of Onset   Colon cancer Mother 101   Heart disease Mother    Heart disease Father    Cancer - Other Daughter        glioblastoma- died age 52    Obesity Neg Hx    Sleep apnea Neg Hx    Stroke Neg Hx    Heart attack Neg Hx    Colon polyps Neg Hx    Rectal cancer Neg Hx    Stomach cancer Neg Hx    Esophageal cancer Neg Hx     SOCIAL HISTORY: Social History   Socioeconomic History   Marital status: Married    Spouse name: Not on file   Number of children: Not on file   Years of education: Not on file   Highest education level: Not on file  Occupational History   Not on file  Tobacco Use   Smoking status: Never   Smokeless tobacco: Never  Vaping Use   Vaping status: Never Used  Substance and Sexual Activity   Alcohol use: Yes    Alcohol/week: 2.0 standard drinks of alcohol    Types: 2 Cans of beer per week    Comment: rarely 4x a month/beer   Drug use: No   Sexual activity: Not on file  Other Topics Concern   Not on file  Social History Narrative   Not on file   Social Drivers of Health   Financial Resource Strain: Low Risk  (08/27/2023)   Received from [facility]   Overall Financial Resource Strain (CARDIA)    Difficulty of Paying Living Expenses: Not hard at all  Food Insecurity: No Food Insecurity (08/27/2023)   Received from [facility]   Hunger Vital Sign    Within the past 12 months, you worried that your food would run out before you got the money to buy more.: Never true    Within the past 12 months, the food you bought just didn't last and you didn't have money to get more.: Never true  Transportation Needs: No Transportation Needs (08/27/2023)   Received from [facility] - Transportation    Lack of Transportation (Medical): No    Lack of Transportation (Non-Medical): No  Physical Activity: Insufficiently Active (08/27/2023)   Received from  [facility]   Exercise Vital Sign    On average, how many days per week do you engage in moderate to strenuous exercise (like a brisk walk)?: 2 days    On average, how many minutes do you engage in exercise at this level?: 30 min  Stress: No Stress Concern Present (08/27/2023)   Received from [facility]  of Occupational [facility] - Occupational Stress Questionnaire    Feeling of Stress : Not at all  Social Connections: Moderately Integrated (08/27/2023)   Received from [facility]   Social Network    How would you rate your social network (family, work, friends)?: Adequate participation with social networks    MEDICATIONS:  Current Outpatient Medications  Medication Sig Dispense Refill   ACCU-CHEK GUIDE test strip CHECK SUGAR 3 TIMES A DAY 100 strip 3   Accu-Chek Softclix Lancets lancets CHECK SUGARS 3 TIMES DAILY E11.65 100 each 1   amLODipine (NORVASC) 2.5 MG tablet Take by mouth.     aspirin  81 MG tablet Take 1 tablet (81 mg total) by mouth daily. STOP TAKING AND RESTART Dec 29 2011 (Patient taking differently: Take 81 mg by mouth daily.) 30 tablet    atorvastatin (LIPITOR) 40 MG tablet Take 40 mg by mouth daily.     Blood Glucose Monitoring Suppl (ACCU-CHEK GUIDE) w/Device KIT 1 each by Does not apply route daily. Use glucose meter to check blood sugar as prescribed. 1 kit 0   Continuous Glucose Sensor (FREESTYLE LIBRE 3 PLUS SENSOR) MISC 1 each by Does not apply route continuous. Change every 15 days. 6 each 3   glimepiride  (AMARYL ) 2 MG tablet TAKE 2 TABLETS BY MOUTH DAILY AFTER SUPPER. 180 tablet 3   insulin  aspart (NOVOLOG  FLEXPEN) 100 UNIT/ML FlexPen Novolog  7 units with breakfast, 7 units lunch and 10 with supper 3 times a day. (Patient taking differently: Novolog  7 units with breakfast, 7 units lunch and 10 with supper 3 times a day., patient takes 5-7 units in the morning only) 15 mL 11   Insulin  Glargine (BASAGLAR KWIKPEN) 100 UNIT/ML Inject 18 Units into  the skin daily. (Patient taking differently: Inject 18 Units into the skin daily. Patient taking 16 units) 15 mL 3   Insulin  Pen Needle 32G X 4 MM MISC Use 4x a day 300 each 3   Lancets (ONETOUCH DELICA PLUS LANCET33G) MISC USE TO CHECK BLOOD SUGAR ONCE A DAY DX CODE E11.65 100 each 12   losartan (COZAAR) 100 MG tablet Take by mouth.     metFORMIN  (GLUCOPHAGE -XR) 750 MG 24 hr tablet TAKE 1 TABLET AT BREAKFAST AND 2 TABLETS AT DINNER (Patient taking differently: Patient takes 3 tabs at dinner) 270 tablet 3   No current facility-administered medications for this visit.    PHYSICAL EXAM: Vitals:   05/16/24 1343  BP: 118/70  Pulse: 68  Resp: 16  SpO2: 97%  Weight: 296 lb 6.4 oz (134.4 kg)  Height: 6' (1.829 m)     Body mass index is 40.2 kg/m.  Wt Readings from Last 3 Encounters:  05/16/24 296 lb 6.4 oz (134.4 kg)  11/29/23 (!) 300 lb 3.2 oz (136.2 kg)  08/29/23 (!) 302 lb 9.6 oz (137.3 kg)    General: Well developed, well nourished male in no apparent distress.  HEENT: AT/[place], no external lesions.  Eyes: Conjunctiva clear and no icterus. Neck: Neck supple  Lungs: Respirations not labored Neurologic: Alert, oriented, normal speech Extremities / Skin: Dry.   Psychiatric: Does not appear depressed or anxious  Diabetic Foot Exam - Simple   No data filed    LABS Reviewed Lab Results  Component Value Date   HGBA1C 8.2 (A) 05/16/2024   HGBA1C 8.1 (A) 11/29/2023   HGBA1C 8.4 (A) 08/29/2023   Lab Results  Component Value Date   FRUCTOSAMINE 250 02/14/2023   FRUCTOSAMINE 236 11/16/2021   FRUCTOSAMINE 253  07/05/2021   Lab Results  Component Value Date   CHOL 121 05/17/2022   HDL 29.10 (L) 05/17/2022   LDLCALC 70 05/17/2022   LDLDIRECT 72.0 09/06/2021   TRIG 107.0 05/17/2022   CHOLHDL 4 05/17/2022   No results found for: [facility]  Lab Results  Component Value Date   CREATININE 0.94 05/24/2023   Lab Results  Component Value Date   GFR 83.70 05/24/2023     ASSESSMENT / PLAN  1. Uncontrolled type 2 diabetes mellitus with hyperglycemia, without long-term current use of insulin  (HCC)    Diabetes Mellitus type 2, complicated by no other known complications. - Diabetic status / severity: Uncontrolled.  Lab Results  Component Value Date   HGBA1C 8.2 (A) 05/16/2024    - Hemoglobin A1c goal : <7%  Discussed about starting Mounjaro, he had GI intolerance with Ozempic  and Trulicity  in the past, hesitant to go back on GLP-1 receptor agonist.    Advised to take NovoLog  with lunch and supper regularly.  - Medications: See below.  Diabetes regimen: Increase to Lantus  18 units daily.  Increased from 16 units daily. Novolog  7 units with meals 3 times a day.  He is currently taking 5 units only with breakfast. Metfromin XR 750 mg 3 tabs daily. Take with food.  Glimepiride  4mg  daily. Take with food.  Advised to take with supper.  He is currently taking at bedtime.  - Home glucose testing: Libre 3 and check as needed.  Advised to use extra tape for better adhesion. - Discussed/ Gave Hypoglycemia treatment plan.  # Consult : not required at this time.   # Annual urine for microalbuminuria/ creatinine ratio, no microalbuminuria currently, continue ACE/ARB /losartan.  Will check today. Last  No results found for: MICRALBCREAT   # Foot check nightly / neuropathy.  # Annual dilated diabetic eye exams.   - Diet: Make healthy diabetic food choices - Life style / activity / exercise: Discussed.  2. Blood pressure  -  BP Readings from Last 1 Encounters:  05/16/24 118/70    - Control is in target.  - No change in current plans.  3. Lipid status / Hyperlipidemia - Last  Lab Results  Component Value Date   LDLCALC 70 05/17/2022   - Continue atorvastatin 40 mg daily.  Managed by primary care provider.  Diagnoses and all orders for this visit:  Uncontrolled type 2 diabetes mellitus with hyperglycemia, without long-term current use  of insulin  (HCC) -     POCT glycosylated hemoglobin (Hb A1C) -     Basic metabolic panel with GFR -     Microalbumin / creatinine urine ratio    DISPOSITION Follow up in clinic in 3 months suggested.  Labs today as ordered.   All questions answered and patient verbalized understanding of the plan.  [place] Flor Whitacre, MD [facility] Endocrinology [facility] [address] [place], [address] [place], [place] [address] Phone # [phone]  At least part of this note was generated using voice recognition software. Inadvertent word errors may have occurred, which were not recognized during the proofreading process.

## 2024-05-16 NOTE — Patient Instructions (Addendum)
   Continue Lantus  18 units daily.   Novolog  7 units with meals 3 times a day.   Metfromin XR 750 mg 3 tabs daily. Take with food.  Glimepiride  4mg  daily. Take with food.

## 2024-05-17 LAB — BASIC METABOLIC PANEL WITH GFR
BUN: 16 mg/dL (ref 7–25)
CO2: 23 mmol/L (ref 20–32)
Calcium: 9.5 mg/dL (ref 8.6–10.3)
Chloride: 105 mmol/L (ref 98–110)
Creat: 0.91 mg/dL (ref 0.70–1.35)
Glucose, Bld: 133 mg/dL — ABNORMAL HIGH (ref 65–99)
Potassium: 4.3 mmol/L (ref 3.5–5.3)
Sodium: 140 mmol/L (ref 135–146)
eGFR: 92 mL/min/1.73m2 (ref >=60)

## 2024-05-17 LAB — MICROALBUMIN / CREATININE URINE RATIO
Creatinine, Urine: 134 mg/dL (ref 20–320)
Microalb Creat Ratio: 7 mg/g{creat} (ref <30)
Microalb, Ur: 1 mg/dL

## 2024-08-21 ENCOUNTER — Encounter: Payer: Self-pay | Admitting: Endocrinology

## 2024-08-21 ENCOUNTER — Ambulatory Visit: Payer: Self-pay | Admitting: Endocrinology

## 2024-08-21 ENCOUNTER — Ambulatory Visit (INDEPENDENT_AMBULATORY_CARE_PROVIDER_SITE_OTHER): Admitting: Endocrinology

## 2024-08-21 VITALS — BP 156/60 | HR 73 | Resp 16 | Ht 72.0 in | Wt 302.2 lb

## 2024-08-21 DIAGNOSIS — E1165 Type 2 diabetes mellitus with hyperglycemia: Secondary | ICD-10-CM

## 2024-08-21 DIAGNOSIS — Z794 Long term (current) use of insulin: Secondary | ICD-10-CM

## 2024-08-21 LAB — POCT GLYCOSYLATED HEMOGLOBIN (HGB A1C): Hemoglobin A1C: 8.8 % — AB (ref 4.0–5.6)

## 2024-08-21 MED ORDER — BASAGLAR KWIKPEN 100 UNIT/ML ~~LOC~~ SOPN: 20.0000 [IU] | PEN_INJECTOR | Freq: Every day | SUBCUTANEOUS | 3 refills | Status: AC

## 2024-08-21 MED ORDER — METFORMIN HCL ER 750 MG PO TB24: ORAL_TABLET | ORAL | 3 refills | Status: AC

## 2024-08-21 MED ORDER — NOVOLOG FLEXPEN 100 UNIT/ML ~~LOC~~ SOPN: 7.0000 [IU] | PEN_INJECTOR | Freq: Three times a day (TID) | SUBCUTANEOUS | 4 refills | Status: AC

## 2024-08-21 MED ORDER — GLIMEPIRIDE 2 MG PO TABS: ORAL_TABLET | ORAL | 3 refills | Status: AC

## 2024-08-21 NOTE — Patient Instructions (Addendum)
" °   Latest Reference Range & Units Most Recent 11/29/23 10:38 05/16/24 13:45 08/21/24 10:03  Hemoglobin A1C 4.0 - 5.6 % -  8.8 ! 08/21/24 10:03 Pend 08/21/24 10:03 8.1 ! Pend 8.2 ! Pend 8.8 ! Pend  !: Data is abnormal  Continue Lantus  20 units daily.   Novolog  7 units with meals 3 times a day.   Metfromin XR 750 mg 3 tabs daily. Take with food.  Glimepiride  4mg  daily. Take with food.  "

## 2024-08-21 NOTE — Progress Notes (Signed)
 "  Outpatient Endocrinology Note Hennessy Bartel, MD  08/21/24  Patient's Name: Mark White    DOB: 1955-10-07    MRN: [id number]                                                    REASON OF VISIT: Follow up of type 2 diabetes mellitus  PCP: Sophronia Ozell BROCKS, MD  HISTORY OF PRESENT ILLNESS:   Mark White is a 69 y.o. old male with past medical history listed below, is here for follow up for type 2 diabetes mellitus.   Pertinent Diabetes History: Patient was diagnosed with type 2 diabetes mellitus in 2013.  He has uncontrolled type 2 diabetes mellitus.  Chronic Diabetes Complications : Retinopathy: No. Last ophthalmology exam was done on annually, following with ophthalmology regularly. DUE. Nephropathy: no, on ACE/ARB  Peripheral neuropathy: no Coronary artery disease: no Stroke: no  Relevant comorbidities and cardiovascular risk factors: Obesity: yes Body mass index is 40.99 kg/m.  Hypertension: Yes  Hyperlipidemia : Yes, on statin   Current / Home Diabetic regimen includes: Lantus  16 units daily in the morning. Glimepiride  4 mg daily. Metformin  extended release 750 mg 3 tablets daily. NovoLog  7 units with breakfast as needed with other meals.  Generally not taking with lunch and supper.  Prior diabetic medications: Trulicity  and Ozempic  tried in the past had fatigue and nausea does not like to take anymore, cost was issue too. He is not able to afford SGLT2 drugs.  Glycemic data:    CONTINUOUS GLUCOSE MONITORING SYSTEM (CGMS) INTERPRETATION: At today's visit, we reviewed CGM downloads. The full report is scanned in the media. Reviewing the CGM trends, blood glucose are as follows:  Date: January 8 to August 21, 2024, 14 days       Interpretation: Significant and frequent hyperglycemia with lunch and supper with blood sugar 250-300 range, rare hyperglycemia with breakfast.  Acceptable blood sugar overnight and trending down.  No  hypoglycemia.  Hypoglycemia: Patient has no hypoglycemic episodes. Patient has hypoglycemia awareness.  Factors modifying glucose control: 1.  Diabetic diet assessment: 3 meals, lunch is sometime more of salad.  Dinner is the largest meal.  2.  Staying active or exercising: Walking at [employer]. 2.5 miles / 5 days a week.   3.  Medication compliance: compliant most of the time.  Interval history  CGM data as reviewed above.  Some of the days he was not using CGM.  Diabetes has been as reviewed as noted above.  He has still been taking long-acting insulin  16 units.  Most of the time not taking NovoLog  with lunch and supper.  Hemoglobin A1c worsened to 8.8% today.  He denies numbness and tingling of the feet.  No vision problem.  No other complaints today.  REVIEW OF SYSTEMS As per history of present illness.   PAST MEDICAL HISTORY: Past Medical History:  Diagnosis Date   Allergy    Arthritis    knees   Depression    Diabetes mellitus without complication (HCC)    Hyperlipidemia    Hypertension    Personal history of adenomatous colonic polyps 01/19/2011   Sleep apnea    not using CPAP   Ulcer    30 years ago    PAST SURGICAL HISTORY: Past Surgical History:  Procedure Laterality Date   COLONOSCOPY  12/14/2011   Procedure: COLONOSCOPY;  Surgeon: Lupita FORBES Commander, MD;  Location: [facility];  Service: Endoscopy;  Laterality: N/A;  colon wiht APC for follow up of colon polyps   COLONOSCOPY N/A 10/10/2012   Procedure: COLONOSCOPY;  Surgeon: Lupita FORBES Commander, MD;  Location: [facility];  Service: Endoscopy;  Laterality: N/A;  Need APC   COLONOSCOPY  2021   CG   COLONOSCOPY W/ POLYPECTOMY  01/13/2011   3 polyps, largest 3 cm cecal polyp - adenomas   COLONOSCOPY W/ POLYPECTOMY  03/21/2023   Lupita Commander at [facility]   HOT HEMOSTASIS  12/14/2011   Procedure: HOT HEMOSTASIS (ARGON PLASMA COAGULATION/BICAP);  Surgeon: Lupita FORBES Commander, MD;  Location: [facility];  Service: Endoscopy;   Laterality: N/A;   VASECTOMY     with sedation     ALLERGIES: No Known Allergies  FAMILY HISTORY:  Family History  Problem Relation Age of Onset   Colon cancer Mother 13   Heart disease Mother    Heart disease Father    Cancer - Other Daughter        glioblastoma- died age 45    Obesity Neg Hx    Sleep apnea Neg Hx    Stroke Neg Hx    Heart attack Neg Hx    Colon polyps Neg Hx    Rectal cancer Neg Hx    Stomach cancer Neg Hx    Esophageal cancer Neg Hx     SOCIAL HISTORY: Social History   Socioeconomic History   Marital status: Married    Spouse name: Not on file   Number of children: Not on file   Years of education: Not on file   Highest education level: Not on file  Occupational History   Not on file  Tobacco Use   Smoking status: Never   Smokeless tobacco: Never  Vaping Use   Vaping status: Never Used  Substance and Sexual Activity   Alcohol use: Yes    Alcohol/week: 2.0 standard drinks of alcohol    Types: 2 Cans of beer per week    Comment: rarely 4x a month/beer   Drug use: No   Sexual activity: Not on file  Other Topics Concern   Not on file  Social History Narrative   Not on file   Social Drivers of Health   Tobacco Use: Low Risk (08/21/2024)   Patient History    Smoking Tobacco Use: Never    Smokeless Tobacco Use: Never    Passive Exposure: Not on file  Financial Resource Strain: Low Risk (08/27/2023)   Received from [facility]   Overall Financial Resource Strain (CARDIA)    Difficulty of Paying Living Expenses: Not hard at all  Food Insecurity: No Food Insecurity (08/27/2023)   [facility]   Epic    Within the past 12 months, you worried that your food would run out before you got the money to buy more.: Never true    Within the past 12 months, the food you bought just didn't last and you didn't have money to get more.: Never true  Transportation Needs: No Transportation Needs (08/27/2023)   Received from [facility] - Transportation    Lack of Transportation (Medical): No    Lack of Transportation (Non-Medical): No  Physical Activity: Insufficiently Active (08/27/2023)   Received from [facility]   Exercise Vital Sign    On average, how many days per week do you engage in moderate to strenuous  exercise (like a brisk walk)?: 2 days    On average, how many minutes do you engage in exercise at this level?: 30 min  Stress: No Stress Concern Present (08/27/2023)   Received from [facility] - Occupational Stress Questionnaire    Feeling of Stress : Not at all  Social Connections: Moderately Integrated (08/27/2023)   Received from [facility]   Social Network    How would you rate your social network (family, work, friends)?: Adequate participation with social networks  Depression (PHQ2-9): Low Risk (09/12/2022)   Depression (PHQ2-9)    PHQ-2 Score: 0  Alcohol Screen: Not on file  Housing: High Risk (08/27/2023)   Received from [facility]    In the last 12 months, was there a time when you were not able to pay the mortgage or rent on time?: Yes    In the past 12 months, how many times have you moved where you were living?: 0    At any time in the past 12 months, were you homeless or living in a shelter (including now)?: No  Utilities: Not At Risk (08/27/2023)   Received from [facility] Utilities    Threatened with loss of utilities: No  Health Literacy: Not on file    MEDICATIONS:  Current Outpatient Medications  Medication Sig Dispense Refill   ACCU-CHEK GUIDE test strip CHECK SUGAR 3 TIMES A DAY 100 strip 3   Accu-Chek Softclix Lancets lancets CHECK SUGARS 3 TIMES DAILY E11.65 100 each 1   amLODipine (NORVASC) 2.5 MG tablet Take by mouth.     aspirin  81 MG tablet Take 1 tablet (81 mg total) by mouth daily. STOP TAKING AND RESTART Dec 29 2011 30 tablet    atorvastatin (LIPITOR) 40 MG tablet Take 40 mg by mouth daily.      Blood Glucose Monitoring Suppl (ACCU-CHEK GUIDE) w/Device KIT 1 each by Does not apply route daily. Use glucose meter to check blood sugar as prescribed. 1 kit 0   Continuous Glucose Sensor (FREESTYLE LIBRE 3 PLUS SENSOR) MISC 1 each by Does not apply route continuous. Change every 15 days. 6 each 3   Insulin  Pen Needle 32G X 4 MM MISC Use 4x a day 300 each 3   Lancets (ONETOUCH DELICA PLUS LANCET33G) MISC USE TO CHECK BLOOD SUGAR ONCE A DAY DX CODE E11.65 100 each 12   losartan (COZAAR) 100 MG tablet Take by mouth.     glimepiride  (AMARYL ) 2 MG tablet TAKE 2 TABLETS BY MOUTH DAILY AFTER SUPPER. 180 tablet 3   insulin  aspart (NOVOLOG  FLEXPEN) 100 UNIT/ML FlexPen Inject 7 Units into the skin 3 (three) times daily with meals. Novolog  7 units with breakfast, 7 units lunch and 10 with supper 3 times a day. 15 mL 4   Insulin  Glargine (BASAGLAR  KWIKPEN) 100 UNIT/ML Inject 20 Units into the skin daily. 15 mL 3   metFORMIN  (GLUCOPHAGE -XR) 750 MG 24 hr tablet Patient takes 3 tabs at dinner 270 tablet 3   No current facility-administered medications for this visit.    PHYSICAL EXAM: Vitals:   08/21/24 1002  BP: (!) 156/60  Pulse: 73  Resp: 16  SpO2: 96%  Weight: (!) 302 lb 3.2 oz (137.1 kg)  Height: 6' (1.829 m)     Body mass index is 40.99 kg/m.  Wt Readings from Last 3 Encounters:  08/21/24 (!) 302 lb 3.2 oz (137.1 kg)  05/16/24 296 lb  6.4 oz (134.4 kg)  11/29/23 (!) 300 lb 3.2 oz (136.2 kg)    General: Well developed, well nourished male in no apparent distress.  HEENT: AT/[place], no external lesions.  Eyes: Conjunctiva clear and no icterus. Neck: Neck supple  Lungs: Respirations not labored Neurologic: Alert, oriented, normal speech Extremities / Skin: Dry.   Psychiatric: Does not appear depressed or anxious  Diabetic Foot Exam - Simple   Simple Foot Form Diabetic Foot exam was performed with the following findings: Yes 08/21/2024 10:21 AM  Visual Inspection No deformities, no  ulcerations, no other skin breakdown bilaterally: Yes Sensation Testing Intact to touch and monofilament testing bilaterally: Yes Pulse Check Posterior Tibialis and Dorsalis pulse intact bilaterally: Yes Comments    LABS Reviewed Lab Results  Component Value Date   HGBA1C 8.8 (A) 08/21/2024   HGBA1C 8.2 (A) 05/16/2024   HGBA1C 8.1 (A) 11/29/2023   Lab Results  Component Value Date   FRUCTOSAMINE 250 02/14/2023   FRUCTOSAMINE 236 11/16/2021   FRUCTOSAMINE 253 07/05/2021   Lab Results  Component Value Date   CHOL 121 05/17/2022   HDL 29.10 (L) 05/17/2022   LDLCALC 70 05/17/2022   LDLDIRECT 72.0 09/06/2021   TRIG 107.0 05/17/2022   CHOLHDL 4 05/17/2022   Lab Results  Component Value Date   MICRALBCREAT 7 05/16/2024    Lab Results  Component Value Date   CREATININE 0.91 05/16/2024   Lab Results  Component Value Date   GFR 83.70 05/24/2023    ASSESSMENT / PLAN  1. Uncontrolled type 2 diabetes mellitus with hyperglycemia, with long-term current use of insulin  (HCC)   2. Type 2 diabetes mellitus with hyperglycemia, with long-term current use of insulin  (HCC)    Diabetes Mellitus type 2, complicated by no other known complications. - Diabetic status / severity: Uncontrolled.  Worsening.  Lab Results  Component Value Date   HGBA1C 8.8 (A) 08/21/2024    - Hemoglobin A1c goal : <7%  Postprandial hyperglycemia related to lunch and supper due to not taking NovoLog . Advised to take NovoLog  with lunch and supper regularly.  - Medications: See below.  Diabetes regimen: Increase Lantus  from 16 to 20 units daily. Novolog  7 units with meals 3 times a day.  Encouraged to take with all 3 meals. Metfromin XR 750 mg 3 tabs daily.   Glimepiride  4mg  daily. Take with food.    - Home glucose testing: Libre 3+ and check as needed.  Advised to use extra tape for better adhesion. - Discussed/ Gave Hypoglycemia treatment plan.  # Consult : not required at this time.   #  Annual urine for microalbuminuria/ creatinine ratio, no microalbuminuria currently, continue ACE/ARB /losartan.   Last  Lab Results  Component Value Date   MICRALBCREAT 7 05/16/2024     # Foot check nightly / neuropathy.  # Annual dilated diabetic eye exams.   - Diet: Make healthy diabetic food choices - Life style / activity / exercise: Discussed.  2. Blood pressure  -  BP Readings from Last 1 Encounters:  08/21/24 (!) 156/60    - Control is not in target. Mildly high.  Reports normal at home.  Asked to monitor at home and if remains high to talk with primary care provider. - No change in current plans.  3. Lipid status / Hyperlipidemia - Last  Lab Results  Component Value Date   LDLCALC 70 05/17/2022   - Continue atorvastatin 40 mg daily.  Managed by primary care provider.  Diagnoses and  all orders for this visit:  Uncontrolled type 2 diabetes mellitus with hyperglycemia, with long-term current use of insulin  (HCC) -     POCT glycosylated hemoglobin (Hb A1C) -     Insulin  Glargine (BASAGLAR  KWIKPEN) 100 UNIT/ML; Inject 20 Units into the skin daily. -     metFORMIN  (GLUCOPHAGE -XR) 750 MG 24 hr tablet; Patient takes 3 tabs at dinner -     insulin  aspart (NOVOLOG  FLEXPEN) 100 UNIT/ML FlexPen; Inject 7 Units into the skin 3 (three) times daily with meals. Novolog  7 units with breakfast, 7 units lunch and 10 with supper 3 times a day.  Type 2 diabetes mellitus with hyperglycemia, with long-term current use of insulin  (HCC) -     glimepiride  (AMARYL ) 2 MG tablet; TAKE 2 TABLETS BY MOUTH DAILY AFTER SUPPER.    DISPOSITION Follow up in clinic in 3 months suggested.     All questions answered and patient verbalized understanding of the plan.  Fitzroy Mikami, MD [facility] Endocrinology [facility] [address] [place], [address] [place], [place] [address] Phone # [phone]  At least part of this note was generated using voice recognition software. Inadvertent word  errors may have occurred, which were not recognized during the proofreading process. "

## 2024-11-29 ENCOUNTER — Ambulatory Visit: Admitting: Endocrinology
